# Patient Record
Sex: Female | Born: 1970
Health system: Southern US, Community
[De-identification: ages and names within clinical notes are randomized; demographics above are authoritative.]

## PROBLEM LIST (undated history)

## (undated) DIAGNOSIS — M1990 Unspecified osteoarthritis, unspecified site: Secondary | ICD-10-CM

## (undated) DIAGNOSIS — N6009 Solitary cyst of unspecified breast: Secondary | ICD-10-CM

## (undated) DIAGNOSIS — N390 Urinary tract infection, site not specified: Secondary | ICD-10-CM

## (undated) DIAGNOSIS — D219 Benign neoplasm of connective and other soft tissue, unspecified: Secondary | ICD-10-CM

## (undated) DIAGNOSIS — D649 Anemia, unspecified: Secondary | ICD-10-CM

## (undated) DIAGNOSIS — Z30430 Encounter for insertion of intrauterine contraceptive device: Secondary | ICD-10-CM

## (undated) DIAGNOSIS — T7840XA Allergy, unspecified, initial encounter: Secondary | ICD-10-CM

## (undated) DIAGNOSIS — R739 Hyperglycemia, unspecified: Secondary | ICD-10-CM

## (undated) DIAGNOSIS — R87619 Unspecified abnormal cytological findings in specimens from cervix uteri: Secondary | ICD-10-CM

## (undated) DIAGNOSIS — IMO0002 Reserved for concepts with insufficient information to code with codable children: Secondary | ICD-10-CM

## (undated) DIAGNOSIS — E119 Type 2 diabetes mellitus without complications: Secondary | ICD-10-CM

## (undated) DIAGNOSIS — Z8709 Personal history of other diseases of the respiratory system: Secondary | ICD-10-CM

## (undated) DIAGNOSIS — K219 Gastro-esophageal reflux disease without esophagitis: Secondary | ICD-10-CM

## (undated) DIAGNOSIS — R102 Pelvic and perineal pain: Secondary | ICD-10-CM

## (undated) DIAGNOSIS — Z8742 Personal history of other diseases of the female genital tract: Secondary | ICD-10-CM

## (undated) DIAGNOSIS — E162 Hypoglycemia, unspecified: Secondary | ICD-10-CM

## (undated) DIAGNOSIS — E785 Hyperlipidemia, unspecified: Secondary | ICD-10-CM

## (undated) DIAGNOSIS — K589 Irritable bowel syndrome without diarrhea: Secondary | ICD-10-CM

## (undated) DIAGNOSIS — Z8639 Personal history of other endocrine, nutritional and metabolic disease: Secondary | ICD-10-CM

## (undated) DIAGNOSIS — G709 Myoneural disorder, unspecified: Secondary | ICD-10-CM

## (undated) DIAGNOSIS — F419 Anxiety disorder, unspecified: Secondary | ICD-10-CM

## (undated) DIAGNOSIS — R569 Unspecified convulsions: Secondary | ICD-10-CM

## (undated) HISTORY — DX: Gastro-esophageal reflux disease without esophagitis: K21.9

## (undated) HISTORY — DX: Personal history of other diseases of the female genital tract: Z87.42

## (undated) HISTORY — DX: Hypoglycemia, unspecified: E16.2

## (undated) HISTORY — DX: Irritable bowel syndrome, unspecified: K58.9

## (undated) HISTORY — DX: Encounter for insertion of intrauterine contraceptive device: Z30.430

## (undated) HISTORY — DX: Anemia, unspecified: D64.9

## (undated) HISTORY — PX: MYOMECTOMY: SHX85

## (undated) HISTORY — PX: HERNIA REPAIR: SHX51

## (undated) HISTORY — DX: Type 2 diabetes mellitus without complications: E11.9

## (undated) HISTORY — DX: Personal history of other diseases of the respiratory system: Z87.09

## (undated) HISTORY — DX: Unspecified osteoarthritis, unspecified site: M19.90

## (undated) HISTORY — DX: Personal history of other endocrine, nutritional and metabolic disease: Z86.39

## (undated) HISTORY — DX: Allergy, unspecified, initial encounter: T78.40XA

## (undated) HISTORY — DX: Unspecified convulsions: R56.9

## (undated) HISTORY — DX: Anxiety disorder, unspecified: F41.9

## (undated) HISTORY — DX: Benign neoplasm of connective and other soft tissue, unspecified: D21.9

## (undated) HISTORY — DX: Hyperlipidemia, unspecified: E78.5

## (undated) HISTORY — DX: Urinary tract infection, site not specified: N39.0

## (undated) HISTORY — DX: Pelvic and perineal pain: R10.2

## (undated) HISTORY — DX: Myoneural disorder, unspecified: G70.9

## (undated) HISTORY — DX: Hyperglycemia, unspecified: R73.9

## (undated) HISTORY — DX: Solitary cyst of unspecified breast: N60.09

## (undated) HISTORY — DX: Reserved for concepts with insufficient information to code with codable children: IMO0002

## (undated) HISTORY — DX: Unspecified abnormal cytological findings in specimens from cervix uteri: R87.619

---

## 1999-04-05 ENCOUNTER — Ambulatory Visit (HOSPITAL_COMMUNITY): Admission: RE | Admit: 1999-04-05 | Discharge: 1999-04-05 | Payer: Self-pay | Admitting: Obstetrics and Gynecology

## 1999-04-05 ENCOUNTER — Encounter: Payer: Self-pay | Admitting: Obstetrics and Gynecology

## 2002-05-09 ENCOUNTER — Other Ambulatory Visit: Admission: RE | Admit: 2002-05-09 | Discharge: 2002-05-09 | Payer: Self-pay | Admitting: Obstetrics and Gynecology

## 2002-08-01 ENCOUNTER — Encounter: Payer: Self-pay | Admitting: Family Medicine

## 2002-08-01 ENCOUNTER — Ambulatory Visit (HOSPITAL_COMMUNITY): Admission: RE | Admit: 2002-08-01 | Discharge: 2002-08-01 | Payer: Self-pay | Admitting: Family Medicine

## 2003-05-13 ENCOUNTER — Other Ambulatory Visit: Admission: RE | Admit: 2003-05-13 | Discharge: 2003-05-13 | Payer: Self-pay | Admitting: Obstetrics and Gynecology

## 2004-06-22 ENCOUNTER — Other Ambulatory Visit: Admission: RE | Admit: 2004-06-22 | Discharge: 2004-06-22 | Payer: Self-pay | Admitting: Obstetrics and Gynecology

## 2005-06-23 ENCOUNTER — Other Ambulatory Visit: Admission: RE | Admit: 2005-06-23 | Discharge: 2005-06-23 | Payer: Self-pay | Admitting: Obstetrics and Gynecology

## 2005-09-29 ENCOUNTER — Encounter (INDEPENDENT_AMBULATORY_CARE_PROVIDER_SITE_OTHER): Payer: Self-pay | Admitting: Specialist

## 2005-09-29 ENCOUNTER — Ambulatory Visit (HOSPITAL_COMMUNITY): Admission: RE | Admit: 2005-09-29 | Discharge: 2005-09-29 | Payer: Self-pay | Admitting: Obstetrics and Gynecology

## 2005-09-29 HISTORY — PX: HYSTEROSCOPY: SHX211

## 2005-09-29 HISTORY — PX: DILATION AND CURETTAGE OF UTERUS: SHX78

## 2005-10-20 ENCOUNTER — Ambulatory Visit (HOSPITAL_COMMUNITY): Admission: RE | Admit: 2005-10-20 | Discharge: 2005-10-20 | Payer: Self-pay | Admitting: Obstetrics and Gynecology

## 2006-08-21 ENCOUNTER — Other Ambulatory Visit: Admission: RE | Admit: 2006-08-21 | Discharge: 2006-08-21 | Payer: Self-pay | Admitting: Obstetrics and Gynecology

## 2008-01-29 ENCOUNTER — Encounter: Admission: RE | Admit: 2008-01-29 | Discharge: 2008-01-29 | Payer: Self-pay | Admitting: Family Medicine

## 2008-02-29 ENCOUNTER — Encounter: Admission: RE | Admit: 2008-02-29 | Discharge: 2008-02-29 | Payer: Self-pay | Admitting: Family Medicine

## 2008-09-01 ENCOUNTER — Encounter: Admission: RE | Admit: 2008-09-01 | Discharge: 2008-09-01 | Payer: Self-pay | Admitting: Family Medicine

## 2008-09-11 ENCOUNTER — Encounter: Admission: RE | Admit: 2008-09-11 | Discharge: 2008-09-11 | Payer: Self-pay | Admitting: Obstetrics and Gynecology

## 2009-09-14 ENCOUNTER — Encounter: Admission: RE | Admit: 2009-09-14 | Discharge: 2009-09-14 | Payer: Self-pay | Admitting: Obstetrics and Gynecology

## 2009-10-01 ENCOUNTER — Encounter: Admission: RE | Admit: 2009-10-01 | Discharge: 2009-10-01 | Payer: Self-pay | Admitting: Family Medicine

## 2010-09-15 ENCOUNTER — Encounter: Admission: RE | Admit: 2010-09-15 | Discharge: 2010-09-15 | Payer: Self-pay | Admitting: Obstetrics and Gynecology

## 2010-09-27 DIAGNOSIS — Z30430 Encounter for insertion of intrauterine contraceptive device: Secondary | ICD-10-CM

## 2010-09-27 HISTORY — DX: Encounter for insertion of intrauterine contraceptive device: Z30.430

## 2011-04-08 NOTE — Op Note (Signed)
NAMEAMARE, KONTOS NO.:  [id number]   MEDICAL RECORD NO.:  [id number]          PATIENT TYPE:  AMB   LOCATION:  SDC                           FACILITY:  [facility]   PHYSICIAN:  Osborn Coho, M.D.   DATE OF BIRTH:  03-18-1971   DATE OF PROCEDURE:  09/29/2005  DATE OF DISCHARGE:                                 OPERATIVE REPORT   PREOPERATIVE DIAGNOSES:  1.  Menorrhagia.  2.  Fibroid.   POSTOPERATIVE DIAGNOSES:  1.  Menorrhagia.  2.  Fibroid.   PROCEDURES:  1.  Hysteroscopy.  2.  Dilation and curettage.  3.  Resection of fibroid.   ATTENDING:  Osborn Coho, M.D.   ANESTHESIA:  General via LMA.   SPECIMENS TO PATHOLOGY:  1.  Portions of fibroid.  2.  Endometrial curettings.   FLUIDS:  900 mL.   URINE OUTPUT:  Approximately 150 mL prior to procedure via straight  catheterization.   Hysteroscopic fluid deficit (sorbitol), approximately 100 mL estimated, with  approximately 700+ mL on the floor.   ESTIMATED BLOOD LOSS:  Minimal, less than 50 mL.   FINDINGS:  Approximately 3 cm posterior wall submucosal fibroid.   COMPLICATIONS:  None.   PROCEDURE:  The patient was taken to the operating room after the risks,  benefits and alternatives were reviewed with patient, the patient verbalized  understanding and consent signed and witnessed.  The patient was placed  under general anesthesia and prepped and draped in the normal sterile  fashion in the dorsal lithotomy position.  A bivalve speculum placed in the  patient's vagina and a paracervical block administered using a total of 10  mL of 1% lidocaine.  The cervix was dilated for passage of the resectoscope  after single-tooth tenaculum placed on the anterior lip of the cervix.  The  resectoscope introduced and findings as noted above.  Resection of fibroid  was performed and portions of fibroid sent to pathology.  Curettings was  performed was minimal tissue returning with curettage.  The uterus had  been  sounded at the beginning of the case to approximately 8 cm.  All instruments  were removed.  There was hemostasis at the left tenaculum site with a small  amount of bleeding from the right tenaculum site, which was made hemostatic  with a silver nitrate stick.  Count was correct.  The patient tolerated the  procedure well and is currently awaiting transfer to the recovery room in  good condition.      Osborn Coho, M.D.  Electronically Signed     AR/MEDQ  D:  09/29/2005  T:  09/29/2005  Job:  [id number]

## 2011-06-22 HISTORY — PX: ANKLE SURGERY: SHX546

## 2011-07-20 ENCOUNTER — Other Ambulatory Visit: Payer: Self-pay

## 2011-08-26 ENCOUNTER — Other Ambulatory Visit: Payer: Self-pay | Admitting: Obstetrics and Gynecology

## 2011-08-26 DIAGNOSIS — Z1231 Encounter for screening mammogram for malignant neoplasm of breast: Secondary | ICD-10-CM

## 2011-09-19 ENCOUNTER — Ambulatory Visit
Admission: RE | Admit: 2011-09-19 | Discharge: 2011-09-19 | Disposition: A | Discharge status: Home / Self Care | Payer: BC Managed Care – PPO | Source: Ambulatory Visit | Attending: Obstetrics and Gynecology | Admitting: Obstetrics and Gynecology

## 2011-09-19 DIAGNOSIS — Z1231 Encounter for screening mammogram for malignant neoplasm of breast: Secondary | ICD-10-CM

## 2011-11-29 ENCOUNTER — Ambulatory Visit (INDEPENDENT_AMBULATORY_CARE_PROVIDER_SITE_OTHER): Payer: BC Managed Care – PPO

## 2011-11-29 DIAGNOSIS — L600 Ingrowing nail: Secondary | ICD-10-CM

## 2011-11-29 DIAGNOSIS — M79609 Pain in unspecified limb: Secondary | ICD-10-CM

## 2011-11-30 ENCOUNTER — Ambulatory Visit (INDEPENDENT_AMBULATORY_CARE_PROVIDER_SITE_OTHER): Payer: BC Managed Care – PPO

## 2011-11-30 DIAGNOSIS — L600 Ingrowing nail: Secondary | ICD-10-CM

## 2012-02-15 ENCOUNTER — Ambulatory Visit (INDEPENDENT_AMBULATORY_CARE_PROVIDER_SITE_OTHER): Payer: BC Managed Care – PPO | Admitting: Family Medicine

## 2012-02-15 ENCOUNTER — Encounter: Payer: Self-pay | Admitting: Family Medicine

## 2012-02-15 VITALS — BP 104/72 | HR 77 | Temp 98.2°F | Resp 18 | Ht 64.0 in | Wt 140.8 lb

## 2012-02-15 DIAGNOSIS — K219 Gastro-esophageal reflux disease without esophagitis: Secondary | ICD-10-CM | POA: Insufficient documentation

## 2012-02-15 DIAGNOSIS — J309 Allergic rhinitis, unspecified: Secondary | ICD-10-CM

## 2012-02-15 DIAGNOSIS — Z0000 Encounter for general adult medical examination without abnormal findings: Secondary | ICD-10-CM

## 2012-02-15 DIAGNOSIS — Z8639 Personal history of other endocrine, nutritional and metabolic disease: Secondary | ICD-10-CM | POA: Insufficient documentation

## 2012-02-15 DIAGNOSIS — K589 Irritable bowel syndrome without diarrhea: Secondary | ICD-10-CM | POA: Insufficient documentation

## 2012-02-15 DIAGNOSIS — Z8709 Personal history of other diseases of the respiratory system: Secondary | ICD-10-CM

## 2012-02-15 LAB — LIPID PANEL
Cholesterol: 174 mg/dL (ref 0–200)
HDL: 51 mg/dL
LDL Cholesterol: 109 mg/dL — ABNORMAL HIGH (ref 0–99)
Total CHOL/HDL Ratio: 3.4 ratio
Triglycerides: 71 mg/dL
VLDL: 14 mg/dL (ref 0–40)

## 2012-02-15 LAB — CBC WITH DIFFERENTIAL/PLATELET
Basophils Absolute: 0 10*3/uL (ref 0.0–0.1)
Basophils Relative: 0 % (ref 0–1)
Hemoglobin: 12.4 g/dL (ref 12.0–15.0)
MCH: 29.5 pg (ref 26.0–34.0)
MCHC: 32.6 g/dL (ref 30.0–36.0)
MCV: 90.3 fL (ref 78.0–100.0)
Monocytes Relative: 9 % (ref 3–12)
Neutro Abs: 3.3 10*3/uL (ref 1.7–7.7)
Neutrophils Relative %: 53 % (ref 43–77)
Platelets: 227 10*3/uL (ref 150–400)
RBC: 4.21 MIL/uL (ref 3.87–5.11)
WBC: 6.4 10*3/uL (ref 4.0–10.5)

## 2012-02-15 LAB — COMPREHENSIVE METABOLIC PANEL WITH GFR
ALT: 8 U/L (ref 0–35)
AST: 15 U/L (ref 0–37)
Albumin: 4.5 g/dL (ref 3.5–5.2)
Alkaline Phosphatase: 68 U/L (ref 39–117)
BUN: 9 mg/dL (ref 6–23)
CO2: 24 meq/L (ref 19–32)
Calcium: 9.6 mg/dL (ref 8.4–10.5)
Chloride: 103 meq/L (ref 96–112)
Creat: 0.6 mg/dL (ref 0.50–1.10)
Glucose, Bld: 72 mg/dL (ref 70–99)
Potassium: 3.9 meq/L (ref 3.5–5.3)
Sodium: 136 meq/L (ref 135–145)
Total Bilirubin: 0.4 mg/dL (ref 0.3–1.2)
Total Protein: 7.1 g/dL (ref 6.0–8.3)

## 2012-02-15 LAB — POCT URINALYSIS DIPSTICK
Bilirubin, UA: NEGATIVE
Nitrite, UA: NEGATIVE
Protein, UA: NEGATIVE
Spec Grav, UA: 1.025

## 2012-02-15 MED ORDER — HYOSCYAMINE SULFATE 0.125 MG PO TABS: 0.1250 mg | ORAL_TABLET | Freq: Four times a day (QID) | ORAL | Status: DC | PRN

## 2012-02-15 NOTE — Progress Notes (Signed)
  Subjective:    Patient ID: Sandra Booker, female    DOB: 28-Aug-1971, 41 y.o.   MRN: [id number]  HPI  This 41 y.o. AA female is here for annual CPE and medications RFs. She has Hx of Vit D  deficiency and chronic problems with ankle/tendon injuries (S/P ankle surgeries x 2). She has GERD and IBS  for which she takes Levsin (needs a RF). She also has chronic Allergic rhinitis and has never tried a steroid nasal spray.  Weekly headaches are thought to be related to sinus pressure and allergies.   HCM: PAP- August 2012 with Dr. Dierdre Forth ( Normal)    Review of Systems  Constitutional: Negative.   HENT: Positive for congestion, rhinorrhea, sneezing and sinus pressure.   Eyes:       Wears corrective lenses  Respiratory: Negative.   Cardiovascular: Negative.   Gastrointestinal: Positive for abdominal pain and diarrhea.       Has Heartburn and acid reflux; also has IBS  Genitourinary: Negative.   Musculoskeletal: Negative.   Neurological: Positive for headaches.       Headaches occur on a weekly basis  Hematological:       Hx of anemia  Psychiatric/Behavioral: Negative.        Objective:   Physical Exam  Constitutional: She is oriented to person, place, and time. She appears well-developed and well-nourished. No distress.  HENT:  Head: Normocephalic and atraumatic.  Right Ear: External ear normal.  Left Ear: External ear normal.  Mouth/Throat: Oropharynx is clear and moist.       Nasal turbinates slightly enlarged and engorged with clear discharge. Sinuses tender with percussion.  Eyes: Conjunctivae and EOM are normal. Pupils are equal, round, and reactive to light. No scleral icterus.  Neck: Normal range of motion. Neck supple. No thyromegaly present.  Cardiovascular: Normal rate, regular rhythm, normal heart sounds and intact distal pulses.  Exam reveals no gallop and no friction rub.   No murmur heard. Pulmonary/Chest: No respiratory distress. She has no wheezes.    Abdominal: Soft. Bowel sounds are normal. She exhibits no distension and no mass. There is no tenderness. There is no guarding.  Musculoskeletal: Normal range of motion. She exhibits no edema and no tenderness.  Lymphadenopathy:    She has no cervical adenopathy.  Neurological: She is alert and oriented to person, place, and time. She has normal reflexes. No cranial nerve deficit. Coordination normal.  Skin: Skin is warm and dry. No rash noted. No erythema.  Psychiatric: She has a normal mood and affect. Her behavior is normal. Judgment and thought content normal.          Assessment & Plan:   1. Routine general medical examination at a health care facility   CBC with Differential, Comprehensive metabolic panel, Lipid panel, Vitamin D, 25-hydroxy  2. IBS (irritable bowel syndrome)  RF medication: Levsin  3. History of vitamin D deficiency  Continue supplement and healthy diet  4. History of allergic rhinitis  Sample: Qnasl NS given to pt to be used daily; she will contact office if she wants RX for spray

## 2012-02-16 ENCOUNTER — Encounter: Payer: Self-pay | Admitting: Family Medicine

## 2012-02-22 ENCOUNTER — Encounter: Payer: Self-pay | Admitting: Family Medicine

## 2012-02-22 MED ORDER — ERGOCALCIFEROL 1.25 MG (50000 UT) PO CAPS: 50000.0000 [IU] | ORAL_CAPSULE | ORAL | Status: AC

## 2012-02-22 NOTE — Progress Notes (Signed)
Addended by: Dow Adolph B on: 02/22/2012 06:52 PM   Modules accepted: Orders

## 2012-02-22 NOTE — Progress Notes (Signed)
Quick Note:  Please call pt and advise that the following labs are abnormal... Vit D deficiency persists. Drisdol supplement has been prescribed and is available for pick-up from your pharmacy ([facility]). It should be taken 1 capsule once a week. There are RFs.  All other labs are normal.  Provide copy of labs to pt. ______

## 2012-02-22 NOTE — Progress Notes (Signed)
Low Vitamin D level persists; Drisdol 50,000 IU has been prescribed   1 capsule once a week   (4 capsules/ month with 5 RFs).

## 2012-02-23 NOTE — Progress Notes (Signed)
LMOM for pt w/lab results and Rx info.

## 2012-04-02 ENCOUNTER — Ambulatory Visit (INDEPENDENT_AMBULATORY_CARE_PROVIDER_SITE_OTHER): Payer: BC Managed Care – PPO | Admitting: Physician Assistant

## 2012-04-02 VITALS — BP 119/75 | HR 73 | Temp 98.7°F | Resp 16 | Ht 64.0 in | Wt 140.0 lb

## 2012-04-02 DIAGNOSIS — J019 Acute sinusitis, unspecified: Secondary | ICD-10-CM

## 2012-04-02 DIAGNOSIS — J029 Acute pharyngitis, unspecified: Secondary | ICD-10-CM

## 2012-04-02 MED ORDER — IPRATROPIUM BROMIDE 0.06 % NA SOLN: 2.0000 | Freq: Three times a day (TID) | NASAL | Status: DC

## 2012-04-02 MED ORDER — CEFDINIR 300 MG PO CAPS: 300.0000 mg | ORAL_CAPSULE | Freq: Two times a day (BID) | ORAL | Status: AC

## 2012-04-02 NOTE — Progress Notes (Signed)
Patient ID: Sandra Booker MRN: [id number], DOB: 1971/03/06, 41 y.o. Date of Encounter: 04/02/2012, 12:07 PM  Primary Physician: Dow Adolph, MD, MD  Chief Complaint:  Chief Complaint  Patient presents with  . Sinusitis    sx's over 2 weeks (using Zyrtec and Benadryl)    HPI: 41 y.o. year old female presents with a 14 day history of nasal congestion, post nasal drip, sore throat, sinus pressure, and mild cough. Afebrile. No chills. Nasal congestion thick and green/yellow. Sinus pressure is the worst symptom along the frontal sinus. Cough is productive secondary to post nasal drip and not associated with time of day. Ears feel full, leading to sensation of muffled hearing. Has tried OTC cold preps without success. No GI complaints. Appetite normal.  No recent antibiotics, recent travels, or sick contacts   No leg trauma, sedentary periods, h/o cancer, or tobacco use.  Past Medical History  Diagnosis Date  . GERD (gastroesophageal reflux disease)   . IBS (irritable bowel syndrome)   . History of vitamin D deficiency   . History of allergic rhinitis   . History of iron deficiency      Home Meds: Prior to Admission medications   Medication Sig Start Date End Date Taking? Authorizing Provider  ergocalciferol (DRISDOL) 50000 UNITS capsule Take 1 capsule (50,000 Units total) by mouth once a week. 02/22/12 02/21/13 Yes Maurice March, MD  hyoscyamine (LEVSIN, ANASPAZ) 0.125 MG tablet Take 0.125 mg by mouth every 6 (six) hours as needed.   Yes Historical Provider, MD  levonorgestrel (MIRENA) 20 MCG/24HR IUD 1 each by Intrauterine route once.   Yes Historical Provider, MD  Multiple Vitamin (MULTIVITAMIN) tablet Take 1 tablet by mouth daily.   Yes Historical Provider, MD  pantoprazole (PROTONIX) 40 MG tablet Take 40 mg by mouth daily.   Yes Historical Provider, MD         hyoscyamine (LEVSIN, ANASPAZ) 0.125 MG tablet Take 1 tablet (0.125 mg total) by mouth every 6 (six) hours as  needed for cramping. 02/15/12 02/25/12  Maurice March, MD         methocarbamol (ROBAXIN) 500 MG tablet Take 500 mg by mouth 4 (four) times daily.    Historical Provider, MD    Allergies: No Known Allergies  History   Social History  . Marital Status: Married    Spouse Name: N/A    Number of Children: N/A  . Years of Education: N/A   Occupational History  . Not on file.   Social History Main Topics  . Smoking status: Never Smoker   . Smokeless tobacco: Not on file  . Alcohol Use: Not on file  . Drug Use: Not on file  . Sexually Active: Not on file   Other Topics Concern  . Not on file   Social History Narrative  . No narrative on file     Review of Systems: Constitutional: negative for chills, fever, night sweats or weight changes Cardiovascular: negative for chest pain or palpitations Respiratory: negative for hemoptysis, wheezing, or shortness of breath Abdominal: negative for abdominal pain, nausea, vomiting or diarrhea Dermatological: negative for rash Neurologic: negative for headache   Physical Exam: Blood pressure 119/75, pulse 73, temperature 98.7 F (37.1 C), temperature source Oral, resp. rate 16, height 5\' 4"  (1.626 m), weight 140 lb (63.504 kg), SpO2 98.00%., Body mass index is 24.03 kg/(m^2). General: Well developed, well nourished, in no acute distress. Head: Normocephalic, atraumatic, eyes without discharge, sclera non-icteric, nares are congested. Bilateral  auditory canals clear, TM's are without perforation, pearly grey with reflective cone of light bilaterally. Serous effusion bilaterally behind TM's. Maxillary sinus TTP. Oral cavity moist, dentition normal. Posterior pharynx with post nasal drip, mild erythema, and an isolated ulcer along right side without signs of secondary infection. No peritonsillar abscess or tonsillar exudate. Neck: Supple. No thyromegaly. Full ROM. No lymphadenopathy. Lungs: Clear bilaterally to auscultation without wheezes,  rales, or rhonchi. Breathing is unlabored.  Heart: RRR with S1 S2. No murmurs, rubs, or gallops appreciated. Msk:  Strength and tone normal for age. Extremities: No clubbing or cyanosis. No edema. Neuro: Alert and oriented X 3. Moves all extremities spontaneously. CNII-XII grossly in tact. Psych:  Responds to questions appropriately with a normal affect.     ASSESSMENT AND PLAN:  41 y.o. year old female with sinusitis -Omnicef 300 mg 1 po bid #20 no RF  -Atrovent NS 0.06% 2 sprays each nare bid prn #1 no RF -Mucinex -Tylenol/Motrin prn -Rest/fluids -RTC precautions -RTC 3-5 days if no improvement  Elinor Dodge, PA-C 04/02/2012 12:07 PM

## 2012-05-19 ENCOUNTER — Other Ambulatory Visit: Payer: Self-pay | Admitting: Family Medicine

## 2012-08-22 ENCOUNTER — Other Ambulatory Visit: Payer: Self-pay | Admitting: Obstetrics and Gynecology

## 2012-08-22 DIAGNOSIS — Z1231 Encounter for screening mammogram for malignant neoplasm of breast: Secondary | ICD-10-CM

## 2012-09-21 ENCOUNTER — Ambulatory Visit
Admission: RE | Admit: 2012-09-21 | Discharge: 2012-09-21 | Disposition: A | Discharge status: Home / Self Care | Payer: BC Managed Care – PPO | Source: Ambulatory Visit | Attending: Obstetrics and Gynecology | Admitting: Obstetrics and Gynecology

## 2012-09-21 DIAGNOSIS — Z1231 Encounter for screening mammogram for malignant neoplasm of breast: Secondary | ICD-10-CM

## 2012-10-03 DIAGNOSIS — E162 Hypoglycemia, unspecified: Secondary | ICD-10-CM | POA: Insufficient documentation

## 2012-10-03 DIAGNOSIS — Z8742 Personal history of other diseases of the female genital tract: Secondary | ICD-10-CM | POA: Insufficient documentation

## 2012-10-03 DIAGNOSIS — IMO0002 Reserved for concepts with insufficient information to code with codable children: Secondary | ICD-10-CM | POA: Insufficient documentation

## 2012-10-03 DIAGNOSIS — R102 Pelvic and perineal pain: Secondary | ICD-10-CM | POA: Insufficient documentation

## 2012-10-03 DIAGNOSIS — D219 Benign neoplasm of connective and other soft tissue, unspecified: Secondary | ICD-10-CM | POA: Insufficient documentation

## 2012-10-03 DIAGNOSIS — N390 Urinary tract infection, site not specified: Secondary | ICD-10-CM | POA: Insufficient documentation

## 2012-10-03 DIAGNOSIS — Z8639 Personal history of other endocrine, nutritional and metabolic disease: Secondary | ICD-10-CM | POA: Insufficient documentation

## 2012-10-08 ENCOUNTER — Encounter: Payer: Self-pay | Admitting: Obstetrics and Gynecology

## 2012-10-08 ENCOUNTER — Ambulatory Visit (INDEPENDENT_AMBULATORY_CARE_PROVIDER_SITE_OTHER): Payer: BC Managed Care – PPO | Admitting: Obstetrics and Gynecology

## 2012-10-08 VITALS — BP 100/60 | HR 68 | Resp 16 | Ht 64.0 in | Wt 146.0 lb

## 2012-10-08 DIAGNOSIS — D259 Leiomyoma of uterus, unspecified: Secondary | ICD-10-CM

## 2012-10-08 DIAGNOSIS — N949 Unspecified condition associated with female genital organs and menstrual cycle: Secondary | ICD-10-CM

## 2012-10-08 DIAGNOSIS — IMO0002 Reserved for concepts with insufficient information to code with codable children: Secondary | ICD-10-CM

## 2012-10-08 DIAGNOSIS — R87610 Atypical squamous cells of undetermined significance on cytologic smear of cervix (ASC-US): Secondary | ICD-10-CM

## 2012-10-08 DIAGNOSIS — D219 Benign neoplasm of connective and other soft tissue, unspecified: Secondary | ICD-10-CM

## 2012-10-08 DIAGNOSIS — Z124 Encounter for screening for malignant neoplasm of cervix: Secondary | ICD-10-CM

## 2012-10-08 DIAGNOSIS — Z8742 Personal history of other diseases of the female genital tract: Secondary | ICD-10-CM

## 2012-10-08 DIAGNOSIS — R102 Pelvic and perineal pain: Secondary | ICD-10-CM

## 2012-10-08 NOTE — Progress Notes (Signed)
AEX  Last Pap: 10/04/2011 ASC-US WNL: No Regular Periods:no cycles  Contraception: IUD Mirena( Placed 10/11) Monthly Breast exam:yes Tetanus<25yrs:no Nl.Bladder Function:yes Daily BMs:yes Healthy Diet:yes Calcium:yes Mammogram:yes Date of Mammogram: 10/01/2012 WNL Exercise:no Have often Exercise: None Seatbelt: yes Abuse at home: no Stressful work:yes Sigmoid-colonoscopy: None Bone Density: No PCP: Dow Adolph Change in PMH: None Change in RUE:AVWU  Subjective:    Sandra Booker is a 41 y.o. female, G2P2, who presents for an annual exam. No complaints.  Menorrhagia has resolved with Mirena in place.    History   Social History  . Marital Status: Married    Spouse Name: N/A    Number of Children: N/A  . Years of Education: N/A   Social History Main Topics  . Smoking status: Never Smoker   . Smokeless tobacco: None  . Alcohol Use: No  . Drug Use: No  . Sexually Active: Yes    Birth Control/ Protection: IUD   Other Topics Concern  . None   Social History Narrative  . None    Menstrual cycle:   LMP: No LMP recorded. Patient is not currently having periods (Reason: IUD).           Cycle: no bleeding or cramping  The following portions of the patient's history were reviewed and updated as appropriate: allergies, current medications, past family history, past medical history, past social history, past surgical history and problem list.  Review of Systems Pertinent items are noted in HPI. Breast:Negative for breast lump,nipple discharge or nipple retraction Gastrointestinal: Negative for abdominal pain, change in bowel habits or rectal bleeding Urinary:negative   Objective:    BP 100/60  Pulse 68  Resp 16  Ht 5\' 4"  (1.626 m)  Wt 146 lb (66.225 kg)  BMI 25.06 kg/m2    Weight:  Wt Readings from Last 1 Encounters:  10/08/12 146 lb (66.225 kg)          BMI: Body mass index is 25.06 kg/(m^2).  General Appearance: Alert, appropriate appearance  for age. No acute distress HEENT: Grossly normal Neck / Thyroid: Supple, no masses, nodes or enlargement Lungs: clear to auscultation bilaterally Back: No CVA tenderness Breast Exam: No masses or nodes.No dimpling, nipple retraction or discharge. Cardiovascular: Regular rate and rhythm. S1, S2, no murmur Gastrointestinal: Soft, non-tender, no masses or organomegaly Pelvic Exam: External genitalia: normal general appearance Vaginal: normal rugae Cervix: IUD string visualized and exocervical contact bleeding Adnexa: no masses Uterus: irregular enlargement 10-12 wks size Rectovaginal: normal rectal, no masses Lymphatic Exam: Non-palpable nodes in neck, clavicular, axillary, or inguinal regions  Skin: no rash or abnormalities Neurologic: Normal gait and speech, no tremor  Psychiatric: Alert and oriented, appropriate affect.   Wet Prep:not applicable Urinalysis:not applicable UPT: Not done   Assessment:    fibroids now asymptomatic  Menorrhagia, resolved with Mirena  ASCUS neg HR HPV 1 yr ago  Plan:    mammogram pap smear return annually or prn STD screening: declined Contraception:IUD   Dierdre Forth MD

## 2012-10-10 LAB — PAP IG W/ RFLX HPV ASCU

## 2012-10-17 ENCOUNTER — Other Ambulatory Visit: Payer: Self-pay | Admitting: Family Medicine

## 2012-10-19 ENCOUNTER — Ambulatory Visit (INDEPENDENT_AMBULATORY_CARE_PROVIDER_SITE_OTHER): Payer: BC Managed Care – PPO | Admitting: Physician Assistant

## 2012-10-19 VITALS — BP 117/70 | HR 83 | Temp 98.6°F | Resp 16 | Ht 64.0 in | Wt 143.6 lb

## 2012-10-19 DIAGNOSIS — R05 Cough: Secondary | ICD-10-CM

## 2012-10-19 DIAGNOSIS — J329 Chronic sinusitis, unspecified: Secondary | ICD-10-CM

## 2012-10-19 MED ORDER — CEFDINIR 300 MG PO CAPS: 300.0000 mg | ORAL_CAPSULE | Freq: Two times a day (BID) | ORAL | Status: DC

## 2012-10-19 MED ORDER — HYDROCODONE-HOMATROPINE 5-1.5 MG/5ML PO SYRP: ORAL_SOLUTION | ORAL | Status: DC

## 2012-10-19 NOTE — Progress Notes (Signed)
Patient ID: Sandra Booker MRN: [id number], DOB: Feb 11, 1971, 41 y.o. Date of Encounter: 10/19/2012, 4:10 PM  Primary Physician: Dow Adolph, MD  Chief Complaint:  Chief Complaint  Patient presents with  . Cough    some time has chest pain from coughing so much x 5 days  . Nasal Congestion    HPI: 41 y.o. year old female presents with a five day history of nasal congestion, post nasal drip, sore throat, sinus pressure, and cough. Afebrile. No chills. Nasal congestion thick and green/yellow. Cough is sometimes productive of green/yellow sputum and seems to worsen as the day progresses. No wheezing, shortness of breath, dyspnea, tachypnea, or edema. She does complain of being sore from coughing, but no frank chest pain. Ears feel full, leading to sensation of muffled hearing. Has tried OTC cold preps without success. She did restart her left over Atrovent nasal spray and notes that this did help her some with ehr nasal congestion and sinus pressure. No GI complaints. Appetite normal.   No sick contacts, recent antibiotics, or recent travels.   No leg trauma, sedentary periods, h/o cancer, or tobacco use.  Past Medical History  Diagnosis Date  . GERD (gastroesophageal reflux disease)   . IBS (irritable bowel syndrome)   . History of vitamin D deficiency   . History of allergic rhinitis   . History of iron deficiency   . Encounter for insertion of mirena IUD 09/27/2010  . H/O cervicitis   . H/O menorrhagia   . Fibroid   . Hypoglycemia   . UTI (urinary tract infection)     h/o  . Pelvic pain in female     h/o  . Cyst of breast     right breast  . Abnormal Pap smear      Home Meds: Prior to Admission medications   Medication Sig Start Date End Date Taking? Authorizing Provider  hyoscyamine (LEVSIN, ANASPAZ) 0.125 MG tablet Take 0.125 mg by mouth every 6 (six) hours as needed.   Yes Historical Provider, MD  levonorgestrel (MIRENA) 20 MCG/24HR IUD 1 each by Intrauterine  route once.   Yes Historical Provider, MD  Multiple Vitamin (MULTIVITAMIN) tablet Take 1 tablet by mouth daily.   Yes Historical Provider, MD  pantoprazole (PROTONIX) 40 MG tablet TAKE 1 TABLET BY MOUTH EVERY MORNING 30 MINUTES BEFORE MEAL FOR REFLUX 05/19/12  Yes Chelle S Jeffery, PA-C         ergocalciferol (DRISDOL) 50000 UNITS capsule Take 1 capsule (50,000 Units total) by mouth once a week. 02/22/12 02/21/13  Maurice March, MD         hyoscyamine (LEVSIN, ANASPAZ) 0.125 MG tablet TAKE 1 TABLET BY MOUTH EVERY 6 HOURS AS NEEDED FOR CRAMPING 10/17/12   Sondra Barges, PA-C    Allergies: No Known Allergies  History   Social History  . Marital Status: Married    Spouse Name: N/A    Number of Children: N/A  . Years of Education: N/A   Occupational History  . Not on file.   Social History Main Topics  . Smoking status: Never Smoker   . Smokeless tobacco: Not on file  . Alcohol Use: No  . Drug Use: No  . Sexually Active: Yes    Birth Control/ Protection: IUD   Other Topics Concern  . Not on file   Social History Narrative  . No narrative on file     Review of Systems: Constitutional: negative for chills, fever, night sweats or weight  changes Cardiovascular: negative for chest pain or palpitations Respiratory: negative for hemoptysis, wheezing, dyspnea, or shortness of breath Abdominal: negative for abdominal pain, nausea, vomiting or diarrhea Dermatological: negative for rash   Physical Exam: Blood pressure 117/70, pulse 83, temperature 98.6 F (37 C), temperature source Oral, resp. rate 16, height 5\' 4"  (1.626 m), weight 143 lb 9.6 oz (65.137 kg), SpO2 99.00%., Body mass index is 24.65 kg/(m^2). General: Well developed, well nourished, in no acute distress. Head: Normocephalic, atraumatic, eyes without discharge, sclera non-icteric, nares are congested. Bilateral auditory canals clear, TM's are without perforation, pearly grey with reflective cone of light bilaterally.  Serous effusion bilaterally behind TM's. Frontal sinus TTP. Oral cavity moist, dentition normal. Posterior pharynx with post nasal drip and mild erythema. No peritonsillar abscess or tonsillar exudate. Uvula midline. Neck: Supple. No thyromegaly. Full ROM. No lymphadenopathy. Lungs: Coarse breath sounds bilaterally without wheezes, rales, or rhonchi. Breathing is unlabored.  Heart: RRR with S1 S2. No murmurs, rubs, or gallops appreciated. Msk:  Strength and tone normal for age. Extremities: No clubbing or cyanosis. No edema. Neuro: Alert and oriented X 3. Moves all extremities spontaneously. CNII-XII grossly in tact. Psych:  Responds to questions appropriately with a normal affect.     ASSESSMENT AND PLAN:  41 y.o. year old female with sinobronchitis -Omnicef 300 mg 1 po bid #20 no RF  -Hycodan #4oz 1 tsp po q 4-6 hours prn cough no RF SED -Continue at home Atrovent nasal spray -Mucinex -Tylenol/Motrin prn -Rest/fluids -RTC precautions -RTC 3-5 days if no improvement  Signed, Eula Listen, PA-C 10/19/2012 4:10 PM

## 2012-11-22 ENCOUNTER — Ambulatory Visit (INDEPENDENT_AMBULATORY_CARE_PROVIDER_SITE_OTHER): Payer: BC Managed Care – PPO | Admitting: Emergency Medicine

## 2012-11-22 ENCOUNTER — Ambulatory Visit: Payer: BC Managed Care – PPO

## 2012-11-22 VITALS — BP 99/66 | HR 71 | Temp 98.7°F | Resp 16 | Ht 64.5 in | Wt 145.0 lb

## 2012-11-22 DIAGNOSIS — R071 Chest pain on breathing: Secondary | ICD-10-CM

## 2012-11-22 DIAGNOSIS — R0781 Pleurodynia: Secondary | ICD-10-CM

## 2012-11-22 MED ORDER — HYDROCODONE-ACETAMINOPHEN 5-325 MG PO TABS: 1.0000 | ORAL_TABLET | Freq: Four times a day (QID) | ORAL | Status: DC | PRN

## 2012-11-22 NOTE — Progress Notes (Signed)
[facility] [address] [place] [address] [phone]  Date:  11/22/2012   Name:  Sandra Booker   DOB:  November 30, 1970   MRN:  [id number]  PCP:  Dow Adolph, MD    Chief Complaint: Chest Pain   History of Present Illness:  Sandra Booker is a 42 y.o. very pleasant female patient who presents with the following:  Patient was seen at Thanksgiving for bronchitis and sinus infection.  She improved with treatment.  Still having sharp pleuritic right chest pain.  Worse with moving and lifting.  No cough or fever or chills.  No nausea or vomiting.  No wheezing or shortness of breath.  Seems to be worsening with time.  Patient Active Problem List  Diagnosis  . GERD (gastroesophageal reflux disease)  . IBS (irritable bowel syndrome)  . History of vitamin D deficiency  . History of allergic rhinitis  . History of iron deficiency  . H/O cervicitis  . H/O menorrhagia  . Fibroid  . Hypoglycemia  . UTI (urinary tract infection)  . Pelvic pain in female  . ASCUS favor benign    Past Medical History  Diagnosis Date  . GERD (gastroesophageal reflux disease)   . IBS (irritable bowel syndrome)   . History of vitamin D deficiency   . History of allergic rhinitis   . History of iron deficiency   . Encounter for insertion of mirena IUD 09/27/2010  . H/O cervicitis   . H/O menorrhagia   . Fibroid   . Hypoglycemia   . UTI (urinary tract infection)     h/o  . Pelvic pain in female     h/o  . Cyst of breast     right breast  . Abnormal Pap smear     Past Surgical History  Procedure Date  . Myomectomy   . Ankle surgery 06/2011  . Dilation and curettage of uterus 09/29/2005  . Hysteroscopy 09/29/2005    History  Substance Use Topics  . Smoking status: Never Smoker   . Smokeless tobacco: Not on file  . Alcohol Use: No    Family History  Problem Relation Age of Onset  . Breast cancer Mother   . Hypertension Mother   . Hypertension Father   .  Cancer Maternal Grandfather 52    Prostate    No Known Allergies  Medication list has been reviewed and updated.  Current Outpatient Prescriptions on File Prior to Visit  Medication Sig Dispense Refill  . ergocalciferol (DRISDOL) 50000 UNITS capsule Take 1 capsule (50,000 Units total) by mouth once a week.  4 capsule  5  . hyoscyamine (LEVSIN, ANASPAZ) 0.125 MG tablet Take 0.125 mg by mouth every 6 (six) hours as needed.      Marland Kitchen levonorgestrel (MIRENA) 20 MCG/24HR IUD 1 each by Intrauterine route once.      . Multiple Vitamin (MULTIVITAMIN) tablet Take 1 tablet by mouth daily.      . pantoprazole (PROTONIX) 40 MG tablet TAKE 1 TABLET BY MOUTH EVERY MORNING 30 MINUTES BEFORE MEAL FOR REFLUX  30 tablet  5    Review of Systems:  As per HPI, otherwise negative.    Physical Examination: Filed Vitals:   11/22/12 1843  BP: 99/66  Pulse: 71  Temp: 98.7 F (37.1 C)  Resp: 16   Filed Vitals:   11/22/12 1843  Height: 5' 4.5" (1.638 m)  Weight: 145 lb (65.772 kg)   Body mass index is 24.50 kg/(m^2). Ideal  Body Weight: Weight in (lb) to have BMI = 25: 147.6   GEN: WDWN, NAD, Non-toxic, A & O x 3 HEENT: Atraumatic, Normocephalic. Neck supple. No masses, No LAD. Ears and Nose: No external deformity. CV: RRR, No M/G/R. No JVD. No thrill. No extra heart sounds. PULM: CTA B, no wheezes, crackles, rhonchi. No retractions. No resp. distress. No accessory muscle use.  No rub or tenderness ABD: S, NT, ND, +BS. No rebound. No HSM. EXTR: No c/c/e NEURO Normal gait.  PSYCH: Normally interactive. Conversant. Not depressed or anxious appearing.  Calm demeanor.    Assessment and Plan:  Pleuritic chest pain No risks for PE Hydrocodone Follow up for increased or new symptoms  Carmelina Dane, MD  [facility] reading (PRIMARY) by  Dr. Dareen Piano.  No active disease.

## 2012-11-22 NOTE — Patient Instructions (Signed)
Pleurisy Pleurisy is an inflammation and swelling of the lining of the lungs. It usually is the result of an underlying infection or other disease. Because of this inflammation, it hurts to breathe. It is aggravated by coughing or deep breathing. The primary goal in treating pleurisy is to diagnose and treat the condition that caused it.  HOME CARE INSTRUCTIONS   Only take over-the-counter or prescription medicines for pain, discomfort, or fever as directed by your caregiver.  If medications which kill germs (antibiotics) were prescribed, take the entire course. Even if you are feeling better, you need to take them.  Use a cool mist vaporizer to help loosen secretions. This is so the secretions can be coughed up more easily. SEEK MEDICAL CARE IF:   Your pain is not controlled with medication or is increasing.  You have an increase inpus like (purulent) secretions brought up with coughing. SEEK IMMEDIATE MEDICAL CARE IF:   You have blue or dark lips, fingernails, or toenails.  You begin coughing up blood.  You have increased difficulty breathing.  You have continuing pain unrelieved by medicine or lasting more than 1 week.  You have pain that radiates into your neck, arms, or jaw.  You develop increased shortness of breath or wheezing.  You develop a fever, rash, vomiting, fainting, or other serious complaints. Document Released: 11/07/2005 Document Revised: 01/30/2012 Document Reviewed: 06/08/2007 [employer] Patient Information 2013 [employer].  

## 2013-01-04 ENCOUNTER — Other Ambulatory Visit: Payer: Self-pay | Admitting: Family Medicine

## 2013-01-06 ENCOUNTER — Other Ambulatory Visit: Payer: Self-pay | Admitting: Physician Assistant

## 2013-05-21 ENCOUNTER — Other Ambulatory Visit: Payer: Self-pay | Admitting: Physician Assistant

## 2013-06-21 ENCOUNTER — Other Ambulatory Visit: Payer: Self-pay | Admitting: Physician Assistant

## 2013-07-24 ENCOUNTER — Ambulatory Visit (INDEPENDENT_AMBULATORY_CARE_PROVIDER_SITE_OTHER): Payer: BC Managed Care – PPO | Admitting: Family Medicine

## 2013-07-24 ENCOUNTER — Encounter: Payer: Self-pay | Admitting: Family Medicine

## 2013-07-24 VITALS — BP 101/72 | HR 85 | Temp 98.7°F | Resp 16 | Ht 65.0 in | Wt 147.0 lb

## 2013-07-24 DIAGNOSIS — F411 Generalized anxiety disorder: Secondary | ICD-10-CM

## 2013-07-24 DIAGNOSIS — M791 Myalgia, unspecified site: Secondary | ICD-10-CM

## 2013-07-24 DIAGNOSIS — K589 Irritable bowel syndrome without diarrhea: Secondary | ICD-10-CM

## 2013-07-24 DIAGNOSIS — IMO0001 Reserved for inherently not codable concepts without codable children: Secondary | ICD-10-CM

## 2013-07-24 DIAGNOSIS — K219 Gastro-esophageal reflux disease without esophagitis: Secondary | ICD-10-CM

## 2013-07-24 DIAGNOSIS — E559 Vitamin D deficiency, unspecified: Secondary | ICD-10-CM

## 2013-07-24 MED ORDER — PANTOPRAZOLE SODIUM 40 MG PO TBEC: 40.0000 mg | DELAYED_RELEASE_TABLET | Freq: Every day | ORAL | Status: DC

## 2013-07-24 MED ORDER — ALPRAZOLAM 0.25 MG PO TABS: ORAL_TABLET | ORAL | Status: DC

## 2013-07-24 MED ORDER — HYOSCYAMINE SULFATE 0.125 MG PO TABS: ORAL_TABLET | ORAL | Status: DC

## 2013-07-24 NOTE — Progress Notes (Signed)
S:  This 42 y.o. AA female is here for medication refills. She has IBS and GERD which are exacerbated by stress. Diet modification helps reduce symptoms. She has been stressed lately w/ husband's health issues and is sleeping poorly; she falls asleep w/o difficulty but awakens throughout the night. Pt also has a lot of muscle pain in her shoulders and upper back. Chirporactic treatment was helpful but pt cannot afford newer treatment options offered.   PMHx, Soc Hx and Fam Hx reviewed. Medications reconciled. Pt has GYN exam scheduled w/ Dr. Pennie Rushing.  ROS: As per HPI; negative for abnormal weight change, diaphoresis, fever/ chills, anorexia, excessive fatigue, CP or palpitations, SOB or DOE, abd pain,n/v, hematochezia or melena, shin changes or rashes, temp intolerances. Pt has no agitation, hallucinations, behavior changes or dysphoric mood.  O: Filed Vitals:   07/24/13 0949  BP: 101/72  Pulse: 85  Temp: 98.7 F (37.1 C)  Resp: 16   GEN: In NAD; WN,WD. HENT: [place]/AT; EOMI w/ clear conj/sclerae. Otherwise unremarkable.  NECK: Supple w/o LAN or TMG. COR: RRR. LUNGS: Normal resp rate and effort. MS: MAEs; muscle spasms and tenderness in posterior cervical muscles, rhomboids and paravertebral muscle groups. SKIN: W&D; no erythema or lesions. NEURO: A&O x 3; CNs intact. Sensory/motor function intact. Gait normal. Nonfocal.  A/P: IBS (irritable bowel syndrome)- Continue current medication and nutrition modifications. Minimize stress.  GERD (gastroesophageal reflux disease)- Continue PPI.  Anxiety state, unspecified- RX: Alprazolam 0.25 mg 1 tab hs prn sleep.  Muscle pain- Pt to consider massage therapy.  Unspecified vitamin D deficiency- Resume OTC Vitamin D3 1000 IU daily.   Meds ordered this encounter  Medications  . hyoscyamine (LEVSIN, ANASPAZ) 0.125 MG tablet    Sig: TAKE 1 TABLET BY MOUTH EVERY 6 HOURS AS NEEDED FOR CRAMPING    Dispense:  30 tablet    Refill:  5  .  pantoprazole (PROTONIX) 40 MG tablet    Sig: Take 1 tablet (40 mg total) by mouth daily.    Dispense:  30 tablet    Refill:  5  . ALPRAZolam (XANAX) 0.25 MG tablet    Sig: Take 1 tablet at bedtime as needed for sleep.    Dispense:  30 tablet    Refill:  0

## 2013-07-24 NOTE — Patient Instructions (Addendum)
Vitamin D Deficiency Vitamin D is an important vitamin that your body needs. Having too little of it in your body is called a deficiency. A very bad deficiency can make your bones soft and can cause a condition called rickets.  Vitamin D is important to your body for different reasons, such as:   It helps your body absorb 2 minerals called calcium and phosphorus.  It helps make your bones healthy.  It may prevent some diseases, such as diabetes and multiple sclerosis.  It helps your muscles and heart. You can get vitamin D in several ways. It is a natural part of some foods. The vitamin is also added to some dairy products and cereals. Some people take vitamin D supplements. Also, your body makes vitamin D when you are in the sun. It changes the sun's rays into a form of the vitamin that your body can use. CAUSES   Not eating enough foods that contain vitamin D.  Not getting enough sunlight.  Having certain digestive system diseases that make it hard to absorb vitamin D. These diseases include Crohn's disease, chronic pancreatitis, and cystic fibrosis.  Having a surgery in which part of the stomach or small intestine is removed.  Being obese. Fat cells pull vitamin D out of your blood. That means that obese people may not have enough vitamin D left in their blood and in other body tissues.  Having chronic kidney or liver disease. RISK FACTORS Risk factors are things that make you more likely to develop a vitamin D deficiency. They include:  Being older.  Not being able to get outside very much.  Living in a nursing home.  Having had broken bones.  Having weak or thin bones (osteoporosis).  Having a disease or condition that changes how your body absorbs vitamin D.  Having dark skin.  Some medicines such as seizure medicines or steroids.  Being overweight or obese. SYMPTOMS Mild cases of vitamin D deficiency may not have any symptoms. If you have a very bad case, symptoms  may include:  Bone pain.  Muscle pain.  Falling often.  Broken bones caused by a minor injury, due to osteoporosis. DIAGNOSIS A blood test is the best way to tell if you have a vitamin D deficiency. TREATMENT Vitamin D deficiency can be treated in different ways. Treatment for vitamin D deficiency depends on what is causing it. Options include:  Taking vitamin D supplements.  Taking a calcium supplement. Your caregiver will suggest what dose is best for you. HOME CARE INSTRUCTIONS  Take any supplements that your caregiver prescribes. Follow the directions carefully. Take only the suggested amount.  Have your blood tested 2 months after you start taking supplements.  Eat foods that contain vitamin D. Healthy choices include:  Fortified dairy products, cereals, or juices. Fortified means vitamin D has been added to the food. Check the label on the package to be sure.  Fatty fish like salmon or trout.  Eggs.  Oysters.  Spend some time in the sun. Most people should get out in the sun without sunblock for 10 to 15 minutes at a time. Do this 3 times a week. People who have had skin cancer should not do this. Ask your caregiver how long you should be in the sun. Do not use a tanning bed.  Keep your weight at a healthy level. Lose weight if you need to.  Keep all follow-up appointments. Your caregiver will need to perform blood tests to make sure your  vitamin D deficiency is going away. SEEK MEDICAL CARE IF:  You have any questions about your treatment.  You continue to have symptoms of vitamin D deficiency.  You have nausea or vomiting.  You are constipated.  You feel confused.  You have severe abdominal or back pain. MAKE SURE YOU:  Understand these instructions.  Will watch your condition.  Will get help right away if you are not doing well or get worse. Document Released: 01/30/2012 Document Reviewed: 01/30/2012 [facility] Patient Information 2014 [place],  [place].   Get a Vitamin D3 supplement [employer]) 1000 IU and take it daily as well as getting some sun exposure most days of the week.

## 2013-08-30 ENCOUNTER — Encounter: Payer: Self-pay | Admitting: Family Medicine

## 2013-08-30 ENCOUNTER — Ambulatory Visit: Payer: BC Managed Care – PPO

## 2013-08-30 ENCOUNTER — Ambulatory Visit (INDEPENDENT_AMBULATORY_CARE_PROVIDER_SITE_OTHER): Payer: BC Managed Care – PPO | Admitting: Family Medicine

## 2013-08-30 VITALS — BP 105/70 | HR 91 | Temp 99.0°F | Resp 18 | Ht 65.0 in | Wt 148.0 lb

## 2013-08-30 DIAGNOSIS — R0602 Shortness of breath: Secondary | ICD-10-CM

## 2013-08-30 MED ORDER — ALBUTEROL SULFATE HFA 108 (90 BASE) MCG/ACT IN AERS: 2.0000 | INHALATION_SPRAY | Freq: Four times a day (QID) | RESPIRATORY_TRACT | Status: DC | PRN

## 2013-08-30 MED ORDER — ALBUTEROL SULFATE (2.5 MG/3ML) 0.083% IN NEBU
2.5000 mg | INHALATION_SOLUTION | Freq: Once | RESPIRATORY_TRACT | Status: AC
  Administered 2013-08-30: 2.5 mg via RESPIRATORY_TRACT

## 2013-08-30 NOTE — Patient Instructions (Signed)
Use the albuterol inhaler as needed for shortness of breath or wheezing.  If you are not feeling better in the next day or so please let me know.  If you have any severe shortness of breath or other concerns please get help at the ER right away.

## 2013-08-30 NOTE — Progress Notes (Signed)
[facility] [address], [place] [address] [phone]  Date:  08/30/2013   Name:  Sandra Booker   DOB:  [birth year]   MRN:  [id number]  PCP:  Dow Adolph, MD    Chief Complaint: No chief complaint on file.   History of Present Illness:  Sandra Booker is a 42 y.o. 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  She noted SOB and that her chest felt tight after breathing in the fumes.  This feeling has continued since yesterday, and seems to be getting worse.  She does not have a history of asthma and has not noted wheezing.  She has not noted any rash or hives, no lip or tongue swelling.  Her chest is not painful but feels "tight," and taking a deep breath can be difficult  No recent travel, no history of DVT or PE in the past.    She has a mirena IUD so no risk of pregnancy at this time  She has not tried any medication for this condition so far  Patient Active Problem List   Diagnosis Date Noted  . History of iron deficiency   . H/O cervicitis   . H/O menorrhagia   . Fibroid   . Hypoglycemia   . UTI (urinary tract infection)   . Pelvic pain in female   . ASCUS favor benign   . GERD (gastroesophageal reflux disease)   . IBS (irritable bowel syndrome)   . History of vitamin D deficiency   . History of allergic rhinitis     Past Medical History  Diagnosis Date  . GERD (gastroesophageal reflux disease)   . IBS (irritable bowel syndrome)   . History of vitamin D deficiency   . History of allergic rhinitis   . History of iron deficiency   . Encounter for insertion of mirena IUD 09/27/2010  . H/O cervicitis   . H/O menorrhagia   . Fibroid   . Hypoglycemia   . UTI (urinary tract infection)     h/o  . Pelvic pain in female     h/o  . Cyst of breast     right breast  . Abnormal Pap smear     Past Surgical History   Procedure Laterality Date  . Myomectomy    . Ankle surgery  06/2011  . Dilation and curettage of uterus  09/29/2005  . Hysteroscopy  09/29/2005    History  Substance Use Topics  . Smoking status: Never Smoker   . Smokeless tobacco: Not on file  . Alcohol Use: No    Family History  Problem Relation Age of Onset  . Breast cancer Mother   . Hypertension Mother   . Hypertension Father   . Cancer Maternal Grandfather 83    Prostate    No Known Allergies  Medication list has been reviewed and updated.  Current Outpatient Prescriptions on File Prior to Visit  Medication Sig Dispense Refill  . ALPRAZolam (XANAX) 0.25 MG tablet Take 1 tablet at bedtime as needed for sleep.  30 tablet  0  . hyoscyamine (LEVSIN, ANASPAZ) 0.125 MG tablet TAKE 1 TABLET BY MOUTH EVERY 6 HOURS AS NEEDED FOR CRAMPING  30 tablet  5  . levonorgestrel (MIRENA) 20 MCG/24HR IUD 1 each by Intrauterine route once.      . Multiple Vitamin (MULTIVITAMIN) tablet Take 1 tablet by  mouth daily.      . pantoprazole (PROTONIX) 40 MG tablet Take 1 tablet (40 mg total) by mouth daily.  30 tablet  5   No current facility-administered medications on file prior to visit.    Review of Systems:  As per HPI- otherwise negative.  Physical Examination: Filed Vitals:   08/30/13 1703  BP: 122/80  Pulse: 86  Temp: 99 F (37.2 C)  Resp: 18   Filed Vitals:   08/30/13 1703  Height: 5\' 5"  (1.651 m)  Weight: 148 lb (67.132 kg)   Body mass index is 24.63 kg/(m^2). Ideal Body Weight: Weight in (lb) to have BMI = 25: 149.9  GEN: WDWN, NAD, Non-toxic, A & O x 3, looks well, VS reassuring HEENT: Atraumatic, Normocephalic. Neck supple. No masses, No LAD.  Bilateral TM wnl, oropharynx normal.  PEERL,EOMI.   No angioedema Ears and Nose: No external deformity. CV: RRR, No M/G/R. No JVD. No thrill. No extra heart sounds. PULM: CTA B, no wheezes, crackles, rhonchi. No retractions. No resp. distress. No accessory muscle use. ABD:  S, NT, ND EXTR: No c/c/e.  No calf swelling or edema NEURO Normal gait.  PSYCH: Normally interactive. Conversant. Not depressed or anxious appearing.  Calm demeanor.   Treated with albuterol neb which gave her significant relief of her sx.  [facility] reading (PRIMARY) by  Dr. Patsy Lager. CXR:  Negative  CHEST 2 VIEW  COMPARISON: November 22, 2012.  FINDINGS: The heart size and mediastinal contours are within normal limits. Both lungs are clear. The visualized skeletal structures are unremarkable.  IMPRESSION: No active cardiopulmonary disease.  EKG: NSR, no ST elelvation or depression  Assessment and Plan: Shortness of breath - Plan: albuterol (PROVENTIL) (2.5 MG/3ML) 0.083% nebulizer solution 2.5 mg, DG Chest 2 View, EKG 12-Lead, albuterol (PROVENTIL HFA;VENTOLIN HFA) 108 (90 BASE) MCG/ACT inhaler  She felt much better after albuterol neb.  Checked her VS again- stable and normal.  Exam remained normal.  She felt reassured and much better, and wished to go home.  rx for albuterol inhaler to have on hand.  Counseled to seek emergency care right away if her sx get worse.    Signed Abbe Amsterdam, MD

## 2013-09-02 ENCOUNTER — Other Ambulatory Visit: Payer: Self-pay

## 2013-09-02 DIAGNOSIS — Z1231 Encounter for screening mammogram for malignant neoplasm of breast: Secondary | ICD-10-CM

## 2013-09-23 ENCOUNTER — Ambulatory Visit
Admission: RE | Admit: 2013-09-23 | Discharge: 2013-09-23 | Disposition: A | Discharge status: Home / Self Care | Payer: BC Managed Care – PPO | Source: Ambulatory Visit

## 2013-09-23 DIAGNOSIS — Z1231 Encounter for screening mammogram for malignant neoplasm of breast: Secondary | ICD-10-CM

## 2013-09-26 ENCOUNTER — Other Ambulatory Visit: Payer: Self-pay

## 2013-10-30 ENCOUNTER — Ambulatory Visit (INDEPENDENT_AMBULATORY_CARE_PROVIDER_SITE_OTHER): Payer: BC Managed Care – PPO | Admitting: Physician Assistant

## 2013-10-30 VITALS — BP 100/72 | HR 77 | Temp 98.4°F | Resp 18 | Ht 64.0 in | Wt 143.0 lb

## 2013-10-30 DIAGNOSIS — R05 Cough: Secondary | ICD-10-CM

## 2013-10-30 DIAGNOSIS — J329 Chronic sinusitis, unspecified: Secondary | ICD-10-CM

## 2013-10-30 DIAGNOSIS — R059 Cough, unspecified: Secondary | ICD-10-CM

## 2013-10-30 DIAGNOSIS — H6120 Impacted cerumen, unspecified ear: Secondary | ICD-10-CM

## 2013-10-30 DIAGNOSIS — H6121 Impacted cerumen, right ear: Secondary | ICD-10-CM

## 2013-10-30 MED ORDER — AMOXICILLIN-POT CLAVULANATE 875-125 MG PO TABS: 1.0000 | ORAL_TABLET | Freq: Two times a day (BID) | ORAL | Status: DC

## 2013-10-30 MED ORDER — IPRATROPIUM BROMIDE 0.03 % NA SOLN: 2.0000 | Freq: Two times a day (BID) | NASAL | Status: DC

## 2013-10-30 MED ORDER — HYDROCOD POLST-CHLORPHEN POLST 10-8 MG/5ML PO LQCR: 5.0000 mL | Freq: Two times a day (BID) | ORAL | Status: DC | PRN

## 2013-10-30 NOTE — Patient Instructions (Signed)
Get plenty of rest and drink at least 64 ounces of water daily. You may use the Tussionex for cough every 12 hours, however, it causes sleepiness, so reserve it for bedtime if you must be awake and alert during the day.

## 2013-10-30 NOTE — Progress Notes (Signed)
   Subjective:    Patient ID: Sandra Booker, female    DOB: Apr 04, 1971, 42 y.o.   MRN: [id number]  PCP: Dow Adolph, MD  Chief Complaint  Patient presents with  . cough, body aches, congestion    x1 week    Medications, allergies, past medical history, surgical history, family history, social history and problem list reviewed and updated.  HPI  Awoke a week ago today with sore throat.  Has lost her voice.  Cough keeps her awake at night, lots of drainage, chest is sore with coughing.  Occasionally productive, but mostly dry, hacking.  Chills. OTC products without relief. No GI/GU symptoms.  Has had flu vaccine this season.  Review of Systems As above.    Objective:   Physical Exam  Vitals reviewed. Constitutional: She is oriented to person, place, and time. Vital signs are normal. She appears well-developed and well-nourished. No distress.  HENT:  Head: Normocephalic and atraumatic.  Right Ear: Hearing, tympanic membrane and external ear normal. A foreign body (cerumen impaction, cleared with irrigation) is present.  Left Ear: Hearing, tympanic membrane, external ear and ear canal normal.  Nose: Mucosal edema and rhinorrhea present.  No foreign bodies. Right sinus exhibits no maxillary sinus tenderness and no frontal sinus tenderness. Left sinus exhibits no maxillary sinus tenderness and no frontal sinus tenderness.  Mouth/Throat: Uvula is midline, oropharynx is clear and moist and mucous membranes are normal. No uvula swelling. No oropharyngeal exudate.  Eyes: Conjunctivae and EOM are normal. Pupils are equal, round, and reactive to light. Right eye exhibits no discharge. Left eye exhibits no discharge. No scleral icterus.  Neck: Trachea normal, normal range of motion and full passive range of motion without pain. Neck supple. No mass and no thyromegaly present.  Cardiovascular: Normal rate, regular rhythm and normal heart sounds.   Pulmonary/Chest: Effort normal and  breath sounds normal.  Lymphadenopathy:       Head (right side): No submandibular, no tonsillar, no preauricular, no posterior auricular and no occipital adenopathy present.       Head (left side): No submandibular, no tonsillar, no preauricular and no occipital adenopathy present.    She has no cervical adenopathy.       Right: No supraclavicular adenopathy present.       Left: No supraclavicular adenopathy present.  Neurological: She is alert and oriented to person, place, and time. She has normal strength. No cranial nerve deficit or sensory deficit.  Skin: Skin is warm, dry and intact. No rash noted.  Psychiatric: She has a normal mood and affect. Her speech is normal and behavior is normal.          Assessment & Plan:  1. Sinusitis Secondary to initial viral URI - ipratropium (ATROVENT) 0.03 % nasal spray; Place 2 sprays into the nose 2 (two) times daily.  Dispense: 30 mL; Refill: 0 - amoxicillin-clavulanate (AUGMENTIN) 875-125 MG per tablet; Take 1 tablet by mouth 2 (two) times daily.  Dispense: 20 tablet; Refill: 0  2. Cough - chlorpheniramine-HYDROcodone (TUSSIONEX PENNKINETIC ER) 10-8 MG/5ML LQCR; Take 5 mLs by mouth every 12 (twelve) hours as needed for cough (cough).  Dispense: 100 mL; Refill: 0  3. Cerumen impaction, right Resolved with irrigation   Fernande Bras, PA-C Physician Assistant-Certified [facility]

## 2013-12-29 ENCOUNTER — Ambulatory Visit (INDEPENDENT_AMBULATORY_CARE_PROVIDER_SITE_OTHER): Payer: BC Managed Care – PPO | Admitting: Family Medicine

## 2013-12-29 ENCOUNTER — Ambulatory Visit: Payer: BC Managed Care – PPO

## 2013-12-29 VITALS — BP 116/60 | HR 82 | Temp 98.2°F | Resp 16 | Ht 63.0 in | Wt 148.6 lb

## 2013-12-29 DIAGNOSIS — IMO0002 Reserved for concepts with insufficient information to code with codable children: Secondary | ICD-10-CM

## 2013-12-29 DIAGNOSIS — M545 Low back pain, unspecified: Secondary | ICD-10-CM

## 2013-12-29 MED ORDER — CYCLOBENZAPRINE HCL 5 MG PO TABS: 5.0000 mg | ORAL_TABLET | Freq: Every evening | ORAL | Status: DC | PRN

## 2013-12-29 MED ORDER — MELOXICAM 7.5 MG PO TABS: 7.5000 mg | ORAL_TABLET | Freq: Two times a day (BID) | ORAL | Status: DC | PRN

## 2013-12-29 MED ORDER — OXYCODONE-ACETAMINOPHEN 5-325 MG PO TABS: 1.0000 | ORAL_TABLET | Freq: Three times a day (TID) | ORAL | Status: DC | PRN

## 2013-12-29 NOTE — Progress Notes (Signed)
Chief Complaint:  Chief Complaint  Patient presents with  . Back Pain    c/o lower back pain x's 2 weeks    HPI: Sandra Booker is a 43 y.o. female who is here for lower back pain that is radiating down her left leg. She states she has a history of having a weak back leading to pulls, strains and pain in her back.She states that the pain has been going on for three months but has gotten worse in the last two the three weeks. She has tried Aleve and Hydrocodone ( whoch she had from root canal)  which has taken the edge off but is not giving her any relief. Denies CP, SOB, numbness, tingling. She states she does have a spasm like pain that comes and goes.  She has no other complaints.Intermittent, sitting can make it worse but so can prolonged standing, She ahs taken aleve and hydrocodone 5/325 without relief which she ahd root canal work done. Sharp, intermittent, lasts a variable amount of time witohout numbness or tingling.  Back xrays done in June 2014 at [facility]por[facility] office Dr Jimmye Norman. She does not have them with her.    Past Medical History  Diagnosis Date  . GERD (gastroesophageal reflux disease)   . IBS (irritable bowel syndrome)   . History of vitamin D deficiency   . History of allergic rhinitis   . History of iron deficiency   . Encounter for insertion of mirena IUD 09/27/2010  . H/O cervicitis   . H/O menorrhagia   . Fibroid   . Hypoglycemia   . UTI (urinary tract infection)     h/o  . Pelvic pain in female     h/o  . Cyst of breast     right breast  . Abnormal Pap smear    Past Surgical History  Procedure Laterality Date  . Myomectomy    . Ankle surgery  06/2011  . Dilation and curettage of uterus  09/29/2005  . Hysteroscopy  09/29/2005   History   Social History  . Marital Status: Married    Spouse Name: Will    Number of Children: 2  . Years of Education: 12+   Occupational History  . TITLE CLERK [employer]   Social History Main Topics   . Smoking status: Never Smoker   . Smokeless tobacco: Never Used  . Alcohol Use: No  . Drug Use: No  . Sexual Activity: Yes    Birth Control/ Protection: IUD   Other Topics Concern  . None   Social History Narrative   Lives with her husband and their 2 children, when they are not at school.   Family History  Problem Relation Age of Onset  . Breast cancer Mother   . Hypertension Mother   . Cancer Mother 17    breast  . Hypertension Father   . Cancer Maternal Grandfather 58    Prostate   No Known Allergies Prior to Admission medications   Medication Sig Start Date End Date Taking? Authorizing Provider  ALPRAZolam Duanne Moron) 0.25 MG tablet Take 1 tablet at bedtime as needed for sleep. 07/24/13  Yes Barton Fanny, MD  hyoscyamine (LEVSIN, ANASPAZ) 0.125 MG tablet TAKE 1 TABLET BY MOUTH EVERY 6 HOURS AS NEEDED FOR CRAMPING 07/24/13  Yes Barton Fanny, MD  ipratropium (ATROVENT) 0.03 % nasal spray Place 2 sprays into the nose 2 (two) times daily. 10/30/13  Yes Fara Chute, PA-C  levonorgestrel (MIRENA) 20 MCG/24HR IUD 1 each by Intrauterine route once.   Yes Historical Provider, MD  Multiple Vitamin (MULTIVITAMIN) tablet Take 1 tablet by mouth daily.   Yes Historical Provider, MD  pantoprazole (PROTONIX) 40 MG tablet Take 1 tablet (40 mg total) by mouth daily. 07/24/13  Yes Barton Fanny, MD  amoxicillin-clavulanate (AUGMENTIN) 875-125 MG per tablet Take 1 tablet by mouth 2 (two) times daily. 10/30/13   Chelle Janalee Dane, PA-C  chlorpheniramine-HYDROcodone (TUSSIONEX PENNKINETIC ER) 10-8 MG/5ML LQCR Take 5 mLs by mouth every 12 (twelve) hours as needed for cough (cough). 10/30/13   Chelle S Jeffery, PA-C     ROS: The patient denies fevers, chills, night sweats, unintentional weight loss, chest pain, palpitations, wheezing, dyspnea on exertion, nausea, vomiting, abdominal pain, dysuria, hematuria, melena, numbness, weakness, or tingling.   All other systems have been  reviewed and were otherwise negative with the exception of those mentioned in the HPI and as above.    PHYSICAL EXAM: Filed Vitals:   12/29/13 1150  BP: 116/60  Pulse: 82  Temp: 98.2 F (36.8 C)  Resp: 16   Filed Vitals:   12/29/13 1150  Height: 5\' 3"  (1.6 m)  Weight: 148 lb 9.6 oz (67.405 kg)   Body mass index is 26.33 kg/(m^2).  General: Alert, no acute distress HEENT:  Normocephalic, atraumatic, oropharynx patent. EOMI, PERRLA Cardiovascular:  Regular rate and rhythm, no rubs murmurs or gallops.  No Carotid bruits, radial pulse intact. No pedal edema.  Respiratory: Clear to auscultation bilaterally.  No wheezes, rales, or rhonchi.  No cyanosis, no use of accessory musculature GI: No organomegaly, abdomen is soft and non-tender, positive bowel sounds.  No masses. Skin: No rashes. Neurologic: Facial musculature symmetric. Psychiatric: Patient is appropriate throughout our interaction. Lymphatic: No cervical lymphadenopathy Musculoskeletal: Gait intact. + paramsk tenderness  Full ROM 5/5 strength, 2/2 DTRs No saddle anesthesia Straight leg negative Hip and knee exam--normal    LABS: Results for orders placed in visit on 10/08/12  PAP IG W/ RFLX HPV ASCU      Result Value Range   Specimen adequacy:       FINAL DIAGNOSIS:       COMMENTS:       Cytotechnologist:       QC Cytotechnologist:         EKG/XRAY:   Primary read interpreted by Dr. Marin Comment at [facility]. No fx/dislocation   ASSESSMENT/PLAN: Encounter Diagnoses  Name Primary?  . Lumbar pain Yes  . Sprain and strain of back    Back exercises given Rx mobic and flexeril For worsening pain may use percocet, she was not rx norco since hydrocodone does not work for her.  F/u her  Gross sideeffects, risk and benefits, and alternatives of medications d/w patient. Patient is aware that all medications have potential sideeffects and we are unable to predict every sideeffect or drug-drug interaction that may  occur.  LE, Paint, DO 12/29/2013 1:15 PM   \

## 2014-01-24 ENCOUNTER — Other Ambulatory Visit: Payer: Self-pay | Admitting: Family Medicine

## 2014-01-24 ENCOUNTER — Ambulatory Visit (INDEPENDENT_AMBULATORY_CARE_PROVIDER_SITE_OTHER): Payer: BC Managed Care – PPO | Admitting: Family Medicine

## 2014-01-24 ENCOUNTER — Ambulatory Visit: Payer: BC Managed Care – PPO

## 2014-01-24 ENCOUNTER — Encounter: Payer: Self-pay | Admitting: Family Medicine

## 2014-01-24 VITALS — BP 99/65 | HR 80 | Temp 98.7°F | Resp 16 | Ht 64.0 in | Wt 149.0 lb

## 2014-01-24 DIAGNOSIS — R0602 Shortness of breath: Secondary | ICD-10-CM

## 2014-01-24 DIAGNOSIS — Z23 Encounter for immunization: Secondary | ICD-10-CM

## 2014-01-24 DIAGNOSIS — M545 Low back pain, unspecified: Secondary | ICD-10-CM

## 2014-01-24 DIAGNOSIS — Z8639 Personal history of other endocrine, nutritional and metabolic disease: Secondary | ICD-10-CM

## 2014-01-24 DIAGNOSIS — Z0000 Encounter for general adult medical examination without abnormal findings: Secondary | ICD-10-CM

## 2014-01-24 DIAGNOSIS — R319 Hematuria, unspecified: Secondary | ICD-10-CM

## 2014-01-24 DIAGNOSIS — IMO0002 Reserved for concepts with insufficient information to code with codable children: Secondary | ICD-10-CM

## 2014-01-24 LAB — POCT URINALYSIS DIPSTICK
Bilirubin, UA: NEGATIVE
Glucose, UA: NEGATIVE
Ketones, UA: NEGATIVE
Leukocytes, UA: NEGATIVE
NITRITE UA: NEGATIVE
Protein, UA: NEGATIVE
Spec Grav, UA: 1.025
Urobilinogen, UA: 0.2
pH, UA: 5.5

## 2014-01-24 LAB — BASIC METABOLIC PANEL
BUN: 11 mg/dL (ref 6–23)
CALCIUM: 10.4 mg/dL (ref 8.4–10.5)
CHLORIDE: 100 meq/L (ref 96–112)
CO2: 27 meq/L (ref 19–32)
Creat: 0.6 mg/dL (ref 0.50–1.10)
Glucose, Bld: 125 mg/dL — ABNORMAL HIGH (ref 70–99)
Potassium: 4.4 mEq/L (ref 3.5–5.3)
SODIUM: 137 meq/L (ref 135–145)

## 2014-01-24 LAB — CBC
HCT: 39.2 % (ref 36.0–46.0)
HEMOGLOBIN: 13.3 g/dL (ref 12.0–15.0)
MCH: 29.8 pg (ref 26.0–34.0)
MCHC: 33.9 g/dL (ref 30.0–36.0)
MCV: 87.7 fL (ref 78.0–100.0)
Platelets: 260 10*3/uL (ref 150–400)
RBC: 4.47 MIL/uL (ref 3.87–5.11)
RDW: 12.9 % (ref 11.5–15.5)
WBC: 7 10*3/uL (ref 4.0–10.5)

## 2014-01-24 MED ORDER — ALPRAZOLAM 0.25 MG PO TABS: ORAL_TABLET | ORAL | Status: DC

## 2014-01-24 MED ORDER — MELOXICAM 7.5 MG PO TABS: 7.5000 mg | ORAL_TABLET | Freq: Two times a day (BID) | ORAL | Status: DC | PRN

## 2014-01-24 MED ORDER — CYCLOBENZAPRINE HCL 5 MG PO TABS: 5.0000 mg | ORAL_TABLET | Freq: Every evening | ORAL | Status: DC | PRN

## 2014-01-24 MED ORDER — PANTOPRAZOLE SODIUM 40 MG PO TBEC: 40.0000 mg | DELAYED_RELEASE_TABLET | Freq: Every day | ORAL | Status: DC

## 2014-01-24 NOTE — Progress Notes (Signed)
Subjective:    Patient ID: Sandra Booker, female    DOB: 07-09-1971, 43 y.o.   MRN: [id number]  HPI  This 43 y.o. AA female presents today for CPE. She states she feels "tired and run down". When asked about fluid intake; she reports less than 12-18 ounces when at work.  She has intermittent tightness across upper back and chest, noting hx of bronchitis and cough which improved w/ MDI. Chronic LBP has been evaluated by chiropractor and pt advised that she has some arthritis in her back along w/ muscle spasms. Stretching exercises are effective when doe on a regular basis. Pt does clerical work.  HCM: PAP- Per GYN (current).           MMG- Current (negative Nov 2014).           IMM- Needs Tdap.           Vision- Biannually.           Dental- Current.  Patient Active Problem List   Diagnosis Date Noted  . History of iron deficiency   . H/O cervicitis   . H/O menorrhagia   . Fibroid   . Hypoglycemia   . UTI (urinary tract infection)   . Pelvic pain in female   . ASCUS favor benign   . GERD (gastroesophageal reflux disease)   . IBS (irritable bowel syndrome)   . History of vitamin D deficiency   . History of allergic rhinitis     Prior to Admission medications   Medication Sig Start Date End Date Taking? Authorizing Provider  ALPRAZolam Duanne Moron) 0.25 MG tablet Take 1 tablet at bedtime as needed for sleep. 01/24/14  Yes Barton Fanny, MD  cyclobenzaprine (FLEXERIL) 5 MG tablet Take 1 tablet (5 mg total) by mouth at bedtime as needed. 3  Yes   hyoscyamine (LEVSIN, ANASPAZ) 0.125 MG tablet TAKE 1 TABLET BY MOUTH EVERY 6 HOURS AS NEEDED FOR CRAMPING 07/24/13  Yes Barton Fanny, MD  ipratropium (ATROVENT) 0.03 % nasal spray Place 2 sprays into the nose 2 (two) times daily. 10/30/13  Yes Chelle Janalee Dane, PA-C  levonorgestrel (MIRENA) 20 MCG/24HR IUD 1 each by Intrauterine route once.   Yes Historical Provider, MD  meloxicam (MOBIC) 7.5 MG tablet Take 1 tablet (7.5 mg total) by  mouth 2 (two) times daily as needed for pain. Take with food, no other NSAIDs   Yes   Multiple Vitamin (MULTIVITAMIN) tablet Take 1 tablet by mouth daily.   Yes Historical Provider, MD  oxyCODONE-acetaminophen (ROXICET) 5-325 MG per tablet Take 1 tablet by mouth every 8 (eight) hours as needed for severe pain. 12/29/13  Yes Thao P Le, DO  pantoprazole (PROTONIX) 40 MG tablet Take 1 tablet (40 mg total) by mouth daily.   Yes Barton Fanny, MD   PMHx, Surg Hx, Soc and Fam Hx reviewed.   Review of Systems  Respiratory: Positive for chest tightness.   Cardiovascular: Positive for chest pain.  Genitourinary:       GYN performs exam.  Musculoskeletal: Positive for back pain and neck pain.  Allergic/Immunologic: Positive for environmental allergies.  All other systems reviewed and are negative.       Objective:   Physical Exam  Nursing note and vitals reviewed. Constitutional: She is oriented to person, place, and time. She appears well-developed and well-nourished. No distress.  Blood pressure borderline low.  HENT:  Head: Normocephalic and atraumatic.  Right Ear: Hearing, tympanic membrane, external ear and  ear canal normal.  Left Ear: Hearing, tympanic membrane, external ear and ear canal normal.  Nose: Nose normal. No nasal deformity or septal deviation.  Mouth/Throat: Uvula is midline, oropharynx is clear and moist and mucous membranes are normal. No oral lesions. Normal dentition.  Eyes: Conjunctivae, EOM and lids are normal. Pupils are equal, round, and reactive to light. No scleral icterus.  Fundoscopic exam:      The right eye shows no hemorrhage and no papilledema. The right eye shows red reflex.       The left eye shows no hemorrhage and no papilledema. The left eye shows red reflex.  Neck: Trachea normal. Neck supple. No JVD present. No spinous process tenderness and no muscular tenderness present. Decreased range of motion present. No mass and no thyromegaly present.    Cardiovascular: Normal rate, regular rhythm, S1 normal, S2 normal, normal heart sounds and normal pulses.   No extrasystoles are present. PMI is not displaced.  Exam reveals no gallop and no friction rub.   No murmur heard. Pulmonary/Chest: Effort normal and breath sounds normal. No respiratory distress.  Abdominal: Soft. Normal appearance and bowel sounds are normal. She exhibits no distension, no abdominal bruit, no pulsatile midline mass and no mass. There is no hepatosplenomegaly. There is no tenderness. There is no guarding and no CVA tenderness. Hernia confirmed negative in the ventral area.  Genitourinary:  Deferred (performed by GYN).  Musculoskeletal: She exhibits no edema and no tenderness.       Cervical back: She exhibits decreased range of motion and spasm. She exhibits no bony tenderness, no swelling, no deformity and no pain.       Thoracic back: She exhibits deformity and spasm.       Lumbar back: She exhibits spasm. She exhibits no deformity.  Curvature of spine (lower thoracic - upper lumbar).   Lymphadenopathy:       Head (right side): No submental, no submandibular, no tonsillar, no preauricular, no posterior auricular and no occipital adenopathy present.       Head (left side): No submental, no submandibular, no tonsillar, no preauricular, no posterior auricular and no occipital adenopathy present.    She has no cervical adenopathy.       Right: No inguinal and no supraclavicular adenopathy present.       Left: No inguinal and no supraclavicular adenopathy present.  Neurological: She is alert and oriented to person, place, and time. She has normal strength and normal reflexes. She displays no atrophy. No cranial nerve deficit or sensory deficit. She exhibits normal muscle tone. She displays a negative Romberg sign. Coordination and gait normal.  Skin: Skin is warm and intact. No bruising, no lesion and no rash noted. She is not diaphoretic. No cyanosis or erythema. Nails show  no clubbing.  Psychiatric: She has a normal mood and affect. Her speech is normal and behavior is normal. Judgment and thought content normal. Cognition and memory are normal.    Results for orders placed in visit on 01/24/14  POCT URINALYSIS DIPSTICK      Result Value Ref Range   Color, UA yellow     Clarity, UA clear     Glucose, UA neg     Bilirubin, UA neg     Ketones, UA neg     Spec Grav, UA 1.025     Blood, UA small     pH, UA 5.5     Protein, UA neg     Urobilinogen, UA 0.2  Nitrite, UA neg     Leukocytes, UA Negative      [facility] reading (PRIMARY) by  Dr. Leward Quan: Thoracic spine- scoliosis and kyphosis w/ early mild degenerative changes. No acute abnormalities in lung fields.     Assessment & Plan:  Routine general medical examination at a health care facility - Plan: POCT urinalysis dipstick, CBC  Hematuria- This is a recurrent issue; no hx of kidney or urinary tract stone. Pt also notes she has a little spotting today.  History of vitamin D deficiency - Plan: Vitamin D, 25-hydroxy  Shortness of breath - Initial evaluation in October 2014 at [facility] after exposure to paint fumes; treated w/ Albuterol Neb >> relief of symptoms. CXR- negative. Prescribed Albuterol MDI which she has not really used since that visit. Plan: Basic metabolic panel, DG Thoracic Spine 2 View  Lumbar pain - Consider PT or Chiropractic treatment. Pt will think about this. Prefers meds. Plan: cyclobenzaprine (FLEXERIL) 5 MG tablet, meloxicam (MOBIC) 7.5 MG tablet  Sprain and strain of back - Plan: cyclobenzaprine (FLEXERIL) 5 MG tablet, meloxicam (MOBIC) 7.5 MG tablet  Need for Tdap vaccination - Plan: Tdap vaccine greater than or equal to 7yo IM  Meds ordered this encounter  Medications  . ALPRAZolam (XANAX) 0.25 MG tablet    Sig: Take 1 tablet at bedtime as needed for sleep.    Dispense:  30 tablet    Refill:  1  . cyclobenzaprine (FLEXERIL) 5 MG tablet    Sig: Take 1 tablet (5 mg  total) by mouth at bedtime as needed.    Dispense:  30 tablet    Refill:  1  . meloxicam (MOBIC) 7.5 MG tablet    Sig: Take 1 tablet (7.5 mg total) by mouth 2 (two) times daily as needed for pain. Take with food, no other NSAIDs    Dispense:  40 tablet    Refill:  1  . pantoprazole (PROTONIX) 40 MG tablet    Sig: Take 1 tablet (40 mg total) by mouth daily.    Dispense:  30 tablet    Refill:  5

## 2014-01-24 NOTE — Patient Instructions (Addendum)
Keeping You Healthy  Get These Tests 1. Blood Pressure- Have your blood pressure checked once a year by your health care provider.  Normal blood pressure is 120/80. 2. Weight- Have your body mass index (BMI) calculated to screen for obesity.  BMI is measure of body fat based on height and weight.  You can also calculate your own BMI at GravelBags.it. 3. Cholesterol- Have your cholesterol checked every 5 years starting at age 45 then yearly starting at age 13. 56. Chlamydia, HIV, and other sexually transmitted diseases- Get screened every year until age 41, then within three months of each new sexual provider. 5. Pap Smear- Every 1-3 years; discuss with your health care provider. 6. Mammogram- Every year starting at age 28  Take these medicines  Calcium with Vitamin D-Your body needs 1200 mg of Calcium each day and [phone] IU of Vitamin D daily.  Your body can only absorb 500 mg of Calcium at a time so Calcium must be taken in 2 or 3 divided doses throughout the day.  Multivitamin with folic acid- Once daily if it is possible for you to become pregnant.  Get these Immunizations  Gardasil-Series of three doses; prevents HPV related illness such as genital warts and cervical cancer.  Menactra-Single dose; prevents meningitis.  Tetanus shot- Every 10 years.  Flu shot-Every year.  Take these steps 1. Do not smoke-Your healthcare provider can help you quit.  For tips on how to quit go to www.smokefree.gov or call 1-800 QUITNOW. 2. Be physically active- Exercise 5 days a week for at least 30 minutes.  If you are not already physically active, start slow and gradually work up to 30 minutes of moderate physical activity.  Examples of moderate activity include walking briskly, dancing, swimming, bicycling, etc. 3. Breast Cancer- A self breast exam every month is important for early detection of breast cancer.  For more information and instruction on self breast exams, ask your  healthcare provider or https://www.patel.info/. 4. Eat a healthy diet- Eat a variety of healthy foods such as fruits, vegetables, whole grains, low fat milk, low fat cheeses, yogurt, lean meats, poultry and fish, beans, nuts, tofu, etc.  For more information go to www. Thenutritionsource.org 5. Drink alcohol in moderation- Limit alcohol intake to one drink or less per day. Never drink and drive. 6. Depression- Your emotional health is as important as your physical health.  If you're feeling down or losing interest in things you normally enjoy please talk to your healthcare provider about being screened for depression. 7. Dental visit- Brush and floss your teeth twice daily; visit your dentist twice a year. 8. Eye doctor- Get an eye exam at least every 2 years. 9. Helmet use- Always wear a helmet when riding a bicycle, motorcycle, rollerblading or skateboarding. 97. Safe sex- If you may be exposed to sexually transmitted infections, use a condom. 11. Seat belts- Seat belts can save your live; always wear one. 12. Smoke/Carbon Monoxide detectors- These detectors need to be installed on the appropriate level of your home. Replace batteries at least once a year. 13. Skin cancer- When out in the sun please cover up and use sunscreen 15 SPF or higher. 14. Violence- If anyone is threatening or hurting you, please tell your healthcare provider.    Scoliosis Scoliosis is the name given to a spine that curves sideways.Scoliosis can cause twisting of your shoulders, hips, chest, back, and rib cage.  CAUSES  The cause of scoliosis is not always known. It may  be caused by a birth defect or by a disease that can cause muscular dysfunction and imbalance, such as cerebral palsy and muscular dystrophy.  RISK FACTORS Having a disease that causes muscle disease or dysfunction. SIGNS AND SYMPTOMS Scoliosis often has no signs or symptoms.If they are present, they may include:  Unequal size  of one body side compared to the other (asymmetry).  Visible curvature of the spine.  Pain. The pain may limit physical activity.  Shortness of breath.  Bowel or bladder issues. DIAGNOSIS A skilled health care provider will perform an evaluation. This will involve:  Taking your history.  Performing a physical examination.  Performing neurological exam to detect nerve or muscle function loss.  Range of motion studies on the spine.  X-rays. An MRI may also be obtained. TREATMENT  Treatment varies depending on the nature, extent, and severity of the disease. If the curvature is not great, you may need only observation. A brace may be used to prevent scoliosis from progressing. A brace may also be needed during growth spurts. Physical therapy may be of benefit. Surgery may be required.  HOME CARE INSTRUCTIONS   Your health care provider may suggest exercises to strengthen your muscles. Perform them as directed.  Ask your health care provider before participating in any sports.   If you have been prescribed an orthopedic brace, wear it as instructed by your health care provider. SEEK MEDICAL CARE IF: Your brace causes the skin to become sore (chafe) or is uncomfortable.  SEEK IMMEDIATE MEDICAL CARE IF:  You have back pain that is not relieved by the medicines prescribed by your health care provider.   Your legs feel weak or you lose function in your legs.  You lose some bowel or bladder control.  Document Released: 11/04/2000 Document Revised: 08/28/2013 Document Reviewed: 07/15/2013 [facility] Patient Information 2014 [place].   You are not drinking enough fluids; try to drink more water, juices and other liquids throughout the day. This will help you feel much better and increase your blood pressure.

## 2014-01-25 ENCOUNTER — Other Ambulatory Visit: Payer: Self-pay | Admitting: Family Medicine

## 2014-01-25 ENCOUNTER — Encounter: Payer: Self-pay | Admitting: Family Medicine

## 2014-01-25 LAB — VITAMIN D 25 HYDROXY (VIT D DEFICIENCY, FRACTURES): Vit D, 25-Hydroxy: 16 ng/mL — ABNORMAL LOW (ref 30–89)

## 2014-01-25 MED ORDER — VITAMIN D3 250 MCG (10000 UT) PO CAPS: ORAL_CAPSULE | ORAL | Status: DC

## 2014-01-27 ENCOUNTER — Encounter: Payer: Self-pay | Admitting: Family Medicine

## 2014-01-27 ENCOUNTER — Telehealth: Payer: Self-pay | Admitting: Family Medicine

## 2014-01-27 LAB — HEMOGLOBIN A1C
HEMOGLOBIN A1C: 5.9 % — AB (ref <5.7)
Mean Plasma Glucose: 123 mg/dL — ABNORMAL HIGH (ref <117)

## 2014-01-27 NOTE — Telephone Encounter (Signed)
Called [facility] to add A1C to patients labs

## 2014-03-25 ENCOUNTER — Ambulatory Visit (INDEPENDENT_AMBULATORY_CARE_PROVIDER_SITE_OTHER): Payer: BC Managed Care – PPO | Admitting: Family Medicine

## 2014-03-25 VITALS — BP 108/68 | HR 86 | Temp 98.5°F | Resp 16 | Ht 64.0 in | Wt 153.8 lb

## 2014-03-25 DIAGNOSIS — IMO0001 Reserved for inherently not codable concepts without codable children: Secondary | ICD-10-CM

## 2014-03-25 DIAGNOSIS — Z8639 Personal history of other endocrine, nutritional and metabolic disease: Secondary | ICD-10-CM

## 2014-03-25 DIAGNOSIS — M7918 Myalgia, other site: Secondary | ICD-10-CM

## 2014-03-25 DIAGNOSIS — M545 Low back pain, unspecified: Secondary | ICD-10-CM

## 2014-03-25 DIAGNOSIS — IMO0002 Reserved for concepts with insufficient information to code with codable children: Secondary | ICD-10-CM

## 2014-03-25 MED ORDER — OXYCODONE-ACETAMINOPHEN 5-325 MG PO TABS: 1.0000 | ORAL_TABLET | Freq: Three times a day (TID) | ORAL | Status: DC | PRN

## 2014-03-25 MED ORDER — PREDNISONE 10 MG PO TABS: ORAL_TABLET | ORAL | Status: DC

## 2014-03-25 NOTE — Progress Notes (Signed)
This chart was scribed for [name] MD by [name], ED Scribe. This patient was seen in room 12 and the patient's care was started at 1:24 PM .  Subjective:    Patient ID: Sandra Booker, female    DOB: Jul 08, 1971, 42 y.o.   MRN: [id number] Chief Complaint  Patient presents with  . Back Pain    going down into left leg sxs on/off x 6 mos    HPI   HPI Comments: Sandra Booker is a 43 y.o. female who presents to [facility] complaining of recurrent left lower back and buttock pain.  She has been seen here prev by [name] and [name] for the same in the past few mos as pain was first noticed about 6 mos prior and continues to worsen in a waxing/waning pattern. The pain is constant and radiates down her left leg to her knee when she stands ups as well as shoots over to the right side of her low back when severe. She states that while her back pain in constant, it can "flare" to the point that "she cannot walk". There have been 3 episodes of flares which are usually decreased by using nsaids, muscle relaxants, and oxycodone - tries to not use the meds more than 3x/wk only during flairs and takes nothing continually.  She reports associated "tingling" but no numbness .  Is having some weakness due to pain but not intrinsic.  She reports that she has not been to work this week due to the pain.   No f/c, no saddle asesthesia, no change in bowels or bladder. Has normal lumber xray sev mos ago and thoracic xray showed some mild scoliosis and kyphosis. No other imaging.  Pt states that the pain is so bad, that she can barely walk. Pt had been taking Mobic 3x a week, flexeril 3x a week   Pt has been to chiropractor 6 months ago for shoulder and back pain. She was seen 3 months ago by Dr. Nedra Booker for lower back pain that was radiating down her left leg. Dr Sandra Booker prescribed her percocet, the pt reports some relief. She had back XRAY done June 2014, the chiropractor reported  "scar tissue in her neck and that she is not aligned correctly".   Past Medical History  Diagnosis Date  . GERD (gastroesophageal reflux disease)   . IBS (irritable bowel syndrome)   . History of vitamin D deficiency   . History of allergic rhinitis   . History of iron deficiency   . Encounter for insertion of mirena IUD 09/27/2010  . H/O cervicitis   . H/O menorrhagia   . Fibroid   . Hypoglycemia   . UTI (urinary tract infection)     h/o  . Pelvic pain in female     h/o  . Cyst of breast     right breast  . Abnormal Pap smear   . Allergy   . Anemia   . Anxiety   . Arthritis    Current Outpatient Prescriptions on File Prior to Visit  Medication Sig Dispense Refill  . ALPRAZolam (XANAX) 0.25 MG tablet Take 1 tablet at bedtime as needed for sleep.  30 tablet  1  . Cholecalciferol (VITAMIN D3) 10000 UNITS capsule Take 1 capsule by mouth three times a week.  15 capsule  3  . cyclobenzaprine (FLEXERIL) 5 MG tablet Take 1 tablet (5 mg total) by mouth at bedtime as needed.  30 tablet  1  . hyoscyamine (LEVSIN, ANASPAZ) 0.125 MG tablet TAKE 1 TABLET BY MOUTH EVERY 6 HOURS AS NEEDED FOR CRAMPING  30 tablet  5  . ipratropium (ATROVENT) 0.03 % nasal spray Place 2 sprays into the nose 2 (two) times daily.  30 mL  0  . levonorgestrel (MIRENA) 20 MCG/24HR IUD 1 each by Intrauterine route once.      . meloxicam (MOBIC) 7.5 MG tablet Take 1 tablet (7.5 mg total) by mouth 2 (two) times daily as needed for pain. Take with food, no other NSAIDs  40 tablet  1  . Multiple Vitamin (MULTIVITAMIN) tablet Take 1 tablet by mouth daily.      . pantoprazole (PROTONIX) 40 MG tablet Take 1 tablet (40 mg total) by mouth daily.  30 tablet  5   No current facility-administered medications on file prior to visit.    No Known Allergies    Review of Systems  Constitutional: Negative for fever, chills and activity change.  HENT: Negative for congestion, rhinorrhea, sneezing and sore throat.   Respiratory:  Negative for cough.   Cardiovascular: Negative for chest pain.  Gastrointestinal: Negative for nausea, abdominal pain and diarrhea.  Genitourinary: Negative for dysuria and difficulty urinating.  Musculoskeletal: Positive for back pain and gait problem.  Neurological: Negative for weakness and numbness.  Psychiatric/Behavioral: Negative for confusion.       Objective:   Physical Exam  Nursing note and vitals reviewed. Constitutional: She is oriented to person, place, and time. She appears well-developed and well-nourished.  HENT:  Head: Normocephalic and atraumatic.  Eyes: Conjunctivae and EOM are normal. No scleral icterus.  Neck: Normal range of motion. Neck supple. No thyromegaly present.  Cardiovascular: Normal rate and regular rhythm.  Exam reveals no gallop and no friction rub.   No murmur heard. Pulmonary/Chest: Effort normal and breath sounds normal. No stridor. She has no wheezes. She has no rales. She exhibits no tenderness.  Abdominal: She exhibits no distension. There is no tenderness. There is no rebound.  Musculoskeletal: Normal range of motion. She exhibits no edema.  No significant pain lumbar spinous process or para spinous muscles. No pain lateral hip  2+ achilles and DTR's  Lymphadenopathy:    She has no cervical adenopathy.  Neurological: She is alert and oriented to person, place, and time. She exhibits normal muscle tone. Coordination normal.  5/5 muscle test on right.  4+/5 strength on hamstrings on left side.  5/5 strength dorsiflexion.  Skin: No rash noted. No erythema.  Psychiatric: She has a normal mood and affect. Her behavior is normal.       Filed Vitals:   03/25/14 1245  BP: 108/68  Pulse: 86  Temp: 98.5 F (36.9 C)  Resp: 16        Assessment & Plan:  1:36 PM-Discussed treatment plan with pt at bedside and pt agreed to plan.   Lumbago  Gluteal pain - does not seem be sciatica but wonder if this could have started out as a lumbar  strain that is now resulting in gluteal spasm or poss pyriformis syndrome.  Will try therapeutic trial of prednisone taper.  Lumbar pain - Plan: oxyCODONE-acetaminophen (ROXICET) 5-325 MG per tablet - refilled - pt using sparingly has still has some left from the #20 rx'ed 3 mos prev.  Advised pt that I suspect she will only get temporary relief w/ any meds - for actual resolution of her sxs she will likely need PT - pt declines referral today but  will f/u w/ Dr. Everlene Farrier next mo in sched appt to discuss. Handout given for home exercises which she will try. Consider pelvic/SI joint xray at f/u, consider check esr.  Pt will cont vit D supp.  Sprain and strain of back - Plan: oxyCODONE-acetaminophen (ROXICET) 5-325 MG per tablet  Meds ordered this encounter  Medications  . oxyCODONE-acetaminophen (ROXICET) 5-325 MG per tablet    Sig: Take 1 tablet by mouth every 8 (eight) hours as needed for severe pain.    Dispense:  20 tablet    Refill:  0  . predniSONE (DELTASONE) 10 MG tablet    Sig: 6-5-4-3-2-1 po qd taper x 6d    Dispense:  21 tablet    Refill:  0    I personally performed the services described in this documentation, which was scribed in my presence. The recorded information has been reviewed and considered, and addended by me as needed.  Delman Cheadle, MD MPH

## 2014-03-25 NOTE — Patient Instructions (Signed)
Start taking the meloxicam DAILY AFTER the prednisone course is complete.  I think that you are unlikely to sustain any long term relief without physical therapy.  Start doing the below exercises along with heat and hopefully you can get some excellent relief that way. If you continue to have any pain, please follow-up to discuss need for further imaging, labs, or referral. Make sure you are taking your vitamin D and getting gentle slow exercise.  Sciatica with Rehab The sciatic nerve runs from the back down the leg and is responsible for sensation and control of the muscles in the back (posterior) side of the thigh, lower leg, and foot. Sciatica is a condition that is characterized by inflammation of this nerve.  SYMPTOMS   Signs of nerve damage, including numbness and/or weakness along the posterior side of the lower extremity.  Pain in the back of the thigh that may also travel down the leg.  Pain that worsens when sitting for long periods of time.  Occasionally, pain in the back or buttock. CAUSES  Inflammation of the sciatic nerve is the cause of sciatica. The inflammation is due to something irritating the nerve. Common sources of irritation include:  Sitting for long periods of time.  Direct trauma to the nerve.  Arthritis of the spine.  Herniated or ruptured disk.  Slipping of the vertebrae (spondylolithesis)  Pressure from soft tissues, such as muscles or ligament-like tissue (fascia). RISK INCREASES WITH:  Sports that place pressure or stress on the spine (football or weightlifting).  Poor strength and flexibility.  Failure to warm-up properly before activity.  Family history of low back pain or disk disorders.  Previous back injury or surgery.  Poor body mechanics, especially when lifting, or poor posture. PREVENTION   Warm up and stretch properly before activity.  Maintain physical fitness:  Strength, flexibility, and endurance.  Cardiovascular  fitness.  Learn and use proper technique, especially with posture and lifting. When possible, have coach correct improper technique.  Avoid activities that place stress on the spine. PROGNOSIS If treated properly, then sciatica usually resolves within 6 weeks. However, occasionally surgery is necessary.  RELATED COMPLICATIONS   Permanent nerve damage, including pain, numbness, tingle, or weakness.  Chronic back pain.  Risks of surgery: infection, bleeding, nerve damage, or damage to surrounding tissues. TREATMENT Treatment initially involves resting from any activities that aggravate your symptoms. The use of ice and medication may help reduce pain and inflammation. The use of strengthening and stretching exercises may help reduce pain with activity. These exercises may be performed at home or with referral to a therapist. A therapist may recommend further treatments, such as transcutaneous electronic nerve stimulation (TENS) or ultrasound. Your caregiver may recommend corticosteroid injections to help reduce inflammation of the sciatic nerve. If symptoms persist despite non-surgical (conservative) treatment, then surgery may be recommended. MEDICATION  If pain medication is necessary, then nonsteroidal anti-inflammatory medications, such as aspirin and ibuprofen, or other minor pain relievers, such as acetaminophen, are often recommended.  Do not take pain medication for 7 days before surgery.  Prescription pain relievers may be given if deemed necessary by your caregiver. Use only as directed and only as much as you need.  Ointments applied to the skin may be helpful.  Corticosteroid injections may be given by your caregiver. These injections should be reserved for the most serious cases, because they may only be given a certain number of times. HEAT AND COLD  Cold treatment (icing) relieves pain and reduces  inflammation. Cold treatment should be applied for 10 to 15 minutes every 2 to  3 hours for inflammation and pain and immediately after any activity that aggravates your symptoms. Use ice packs or massage the area with a piece of ice (ice massage).  Heat treatment may be used prior to performing the stretching and strengthening activities prescribed by your caregiver, physical therapist, or athletic trainer. Use a heat pack or soak the injury in warm water. SEEK MEDICAL CARE IF:  Treatment seems to offer no benefit, or the condition worsens.  Any medications produce adverse side effects. EXERCISES  RANGE OF MOTION (ROM) AND STRETCHING EXERCISES - Sciatica Most people with sciatic will find that their symptoms worsen with either excessive bending forward (flexion) or arching at the low back (extension). The exercises which will help resolve your symptoms will focus on the opposite motion. Your physician, physical therapist or athletic trainer will help you determine which exercises will be most helpful to resolve your low back pain. Do not complete any exercises without first consulting with your clinician. Discontinue any exercises which worsen your symptoms until you speak to your clinician. If you have pain, numbness or tingling which travels down into your buttocks, leg or foot, the goal of the therapy is for these symptoms to move closer to your back and eventually resolve. Occasionally, these leg symptoms will get better, but your low back pain may worsen; this is typically an indication of progress in your rehabilitation. Be certain to be very alert to any changes in your symptoms and the activities in which you participated in the 24 hours prior to the change. Sharing this information with your clinician will allow him/her to most efficiently treat your condition. These exercises may help you when beginning to rehabilitate your injury. Your symptoms may resolve with or without further involvement from your physician, physical therapist or athletic trainer. While completing  these exercises, remember:   Restoring tissue flexibility helps normal motion to return to the joints. This allows healthier, less painful movement and activity.  An effective stretch should be held for at least 30 seconds.  A stretch should never be painful. You should only feel a gentle lengthening or release in the stretched tissue. FLEXION RANGE OF MOTION AND STRETCHING EXERCISES: STRETCH  Flexion, Single Knee to Chest   Lie on a firm bed or floor with both legs extended in front of you.  Keeping one leg in contact with the floor, bring your opposite knee to your chest. Hold your leg in place by either grabbing behind your thigh or at your knee.  Pull until you feel a gentle stretch in your low back. Hold __________ seconds.  Slowly release your grasp and repeat the exercise with the opposite side. Repeat __________ times. Complete this exercise __________ times per day.  STRETCH  Flexion, Double Knee to Chest  Lie on a firm bed or floor with both legs extended in front of you.  Keeping one leg in contact with the floor, bring your opposite knee to your chest.  Tense your stomach muscles to support your back and then lift your other knee to your chest. Hold your legs in place by either grabbing behind your thighs or at your knees.  Pull both knees toward your chest until you feel a gentle stretch in your low back. Hold __________ seconds.  Tense your stomach muscles and slowly return one leg at a time to the floor. Repeat __________ times. Complete this exercise __________ times per  day.  STRETCH  Low Trunk Rotation   Lie on a firm bed or floor. Keeping your legs in front of you, bend your knees so they are both pointed toward the ceiling and your feet are flat on the floor.  Extend your arms out to the side. This will stabilize your upper body by keeping your shoulders in contact with the floor.  Gently and slowly drop both knees together to one side until you feel a gentle  stretch in your low back. Hold for __________ seconds.  Tense your stomach muscles to support your low back as you bring your knees back to the starting position. Repeat the exercise to the other side. Repeat __________ times. Complete this exercise __________ times per day  EXTENSION RANGE OF MOTION AND FLEXIBILITY EXERCISES: STRETCH  Extension, Prone on Elbows  Lie on your stomach on the floor, a bed will be too soft. Place your palms about shoulder width apart and at the height of your head.  Place your elbows under your shoulders. If this is too painful, stack pillows under your chest.  Allow your body to relax so that your hips drop lower and make contact more completely with the floor.  Hold this position for __________ seconds.  Slowly return to lying flat on the floor. Repeat __________ times. Complete this exercise __________ times per day.  RANGE OF MOTION  Extension, Prone Press Ups  Lie on your stomach on the floor, a bed will be too soft. Place your palms about shoulder width apart and at the height of your head.  Keeping your back as relaxed as possible, slowly straighten your elbows while keeping your hips on the floor. You may adjust the placement of your hands to maximize your comfort. As you gain motion, your hands will come more underneath your shoulders.  Hold this position __________ seconds.  Slowly return to lying flat on the floor. Repeat __________ times. Complete this exercise __________ times per day.  STRENGTHENING EXERCISES - Sciatica  These exercises may help you when beginning to rehabilitate your injury. These exercises should be done near your "sweet spot." This is the neutral, low-back arch, somewhere between fully rounded and fully arched, that is your least painful position. When performed in this safe range of motion, these exercises can be used for people who have either a flexion or extension based injury. These exercises may resolve your symptoms  with or without further involvement from your physician, physical therapist or athletic trainer. While completing these exercises, remember:   Muscles can gain both the endurance and the strength needed for everyday activities through controlled exercises.  Complete these exercises as instructed by your physician, physical therapist or athletic trainer. Progress with the resistance and repetition exercises only as your caregiver advises.  You may experience muscle soreness or fatigue, but the pain or discomfort you are trying to eliminate should never worsen during these exercises. If this pain does worsen, stop and make certain you are following the directions exactly. If the pain is still present after adjustments, discontinue the exercise until you can discuss the trouble with your clinician. STRENGTHENING Deep Abdominals, Pelvic Tilt   Lie on a firm bed or floor. Keeping your legs in front of you, bend your knees so they are both pointed toward the ceiling and your feet are flat on the floor.  Tense your lower abdominal muscles to press your low back into the floor. This motion will rotate your pelvis so that your tail bone  is scooping upwards rather than pointing at your feet or into the floor.  With a gentle tension and even breathing, hold this position for __________ seconds. Repeat __________ times. Complete this exercise __________ times per day.  STRENGTHENING  Abdominals, Crunches   Lie on a firm bed or floor. Keeping your legs in front of you, bend your knees so they are both pointed toward the ceiling and your feet are flat on the floor. Cross your arms over your chest.  Slightly tip your chin down without bending your neck.  Tense your abdominals and slowly lift your trunk high enough to just clear your shoulder blades. Lifting higher can put excessive stress on the low back and does not further strengthen your abdominal muscles.  Control your return to the starting  position. Repeat __________ times. Complete this exercise __________ times per day.  STRENGTHENING  Quadruped, Opposite UE/LE Lift  Assume a hands and knees position on a firm surface. Keep your hands under your shoulders and your knees under your hips. You may place padding under your knees for comfort.  Find your neutral spine and gently tense your abdominal muscles so that you can maintain this position. Your shoulders and hips should form a rectangle that is parallel with the floor and is not twisted.  Keeping your trunk steady, lift your right hand no higher than your shoulder and then your left leg no higher than your hip. Make sure you are not holding your breath. Hold this position __________ seconds.  Continuing to keep your abdominal muscles tense and your back steady, slowly return to your starting position. Repeat with the opposite arm and leg. Repeat __________ times. Complete this exercise __________ times per day.  STRENGTHENING  Abdominals and Quadriceps, Straight Leg Raise   Lie on a firm bed or floor with both legs extended in front of you.  Keeping one leg in contact with the floor, bend the other knee so that your foot can rest flat on the floor.  Find your neutral spine, and tense your abdominal muscles to maintain your spinal position throughout the exercise.  Slowly lift your straight leg off the floor about 6 inches for a count of 15, making sure to not hold your breath.  Still keeping your neutral spine, slowly lower your leg all the way to the floor. Repeat this exercise with each leg __________ times. Complete this exercise __________ times per day. POSTURE AND BODY MECHANICS CONSIDERATIONS - Sciatica Keeping correct posture when sitting, standing or completing your activities will reduce the stress put on different body tissues, allowing injured tissues a chance to heal and limiting painful experiences. The following are general guidelines for improved posture.  Your physician or physical therapist will provide you with any instructions specific to your needs. While reading these guidelines, remember:  The exercises prescribed by your provider will help you have the flexibility and strength to maintain correct postures.  The correct posture provides the optimal environment for your joints to work. All of your joints have less wear and tear when properly supported by a spine with good posture. This means you will experience a healthier, less painful body.  Correct posture must be practiced with all of your activities, especially prolonged sitting and standing. Correct posture is as important when doing repetitive low-stress activities (typing) as it is when doing a single heavy-load activity (lifting). RESTING POSITIONS Consider which positions are most painful for you when choosing a resting position. If you have pain with flexion-based  activities (sitting, bending, stooping, squatting), choose a position that allows you to rest in a less flexed posture. You would want to avoid curling into a fetal position on your side. If your pain worsens with extension-based activities (prolonged standing, working overhead), avoid resting in an extended position such as sleeping on your stomach. Most people will find more comfort when they rest with their spine in a more neutral position, neither too rounded nor too arched. Lying on a non-sagging bed on your side with a pillow between your knees, or on your back with a pillow under your knees will often provide some relief. Keep in mind, being in any one position for a prolonged period of time, no matter how correct your posture, can still lead to stiffness. PROPER SITTING POSTURE In order to minimize stress and discomfort on your spine, you must sit with correct posture Sitting with good posture should be effortless for a healthy body. Returning to good posture is a gradual process. Many people can work toward this most  comfortably by using various supports until they have the flexibility and strength to maintain this posture on their own. When sitting with proper posture, your ears will fall over your shoulders and your shoulders will fall over your hips. You should use the back of the chair to support your upper back. Your low back will be in a neutral position, just slightly arched. You may place a small pillow or folded towel at the base of your low back for support.  When working at a desk, create an environment that supports good, upright posture. Without extra support, muscles fatigue and lead to excessive strain on joints and other tissues. Keep these recommendations in mind: CHAIR:   A chair should be able to slide under your desk when your back makes contact with the back of the chair. This allows you to work closely.  The chair's height should allow your eyes to be level with the upper part of your monitor and your hands to be slightly lower than your elbows. BODY POSITION  Your feet should make contact with the floor. If this is not possible, use a foot rest.  Keep your ears over your shoulders. This will reduce stress on your neck and low back. INCORRECT SITTING POSTURES   If you are feeling tired and unable to assume a healthy sitting posture, do not slouch or slump. This puts excessive strain on your back tissues, causing more damage and pain. Healthier options include:  Using more support, like a lumbar pillow.  Switching tasks to something that requires you to be upright or walking.  Talking a brief walk.  Lying down to rest in a neutral-spine position. PROLONGED STANDING WHILE SLIGHTLY LEANING FORWARD  When completing a task that requires you to lean forward while standing in one place for a long time, place either foot up on a stationary 2-4 inch high object to help maintain the best posture. When both feet are on the ground, the low back tends to lose its slight inward curve. If this  curve flattens (or becomes too large), then the back and your other joints will experience too much stress, fatigue more quickly and can cause pain.  CORRECT STANDING POSTURES Proper standing posture should be assumed with all daily activities, even if they only take a few moments, like when brushing your teeth. As in sitting, your ears should fall over your shoulders and your shoulders should fall over your hips. You should keep a slight  tension in your abdominal muscles to brace your spine. Your tailbone should point down to the ground, not behind your body, resulting in an over-extended swayback posture.  INCORRECT STANDING POSTURES  Common incorrect standing postures include a forward head, locked knees and/or an excessive swayback. WALKING Walk with an upright posture. Your ears, shoulders and hips should all line-up. PROLONGED ACTIVITY IN A FLEXED POSITION When completing a task that requires you to bend forward at your waist or lean over a low surface, try to find a way to stabilize 3 of 4 of your limbs. You can place a hand or elbow on your thigh or rest a knee on the surface you are reaching across. This will provide you more stability so that your muscles do not fatigue as quickly. By keeping your knees relaxed, or slightly bent, you will also reduce stress across your low back. CORRECT LIFTING TECHNIQUES DO :   Assume a wide stance. This will provide you more stability and the opportunity to get as close as possible to the object which you are lifting.  Tense your abdominals to brace your spine; then bend at the knees and hips. Keeping your back locked in a neutral-spine position, lift using your leg muscles. Lift with your legs, keeping your back straight.  Test the weight of unknown objects before attempting to lift them.  Try to keep your elbows locked down at your sides in order get the best strength from your shoulders when carrying an object.  Always ask for help when lifting  heavy or awkward objects. INCORRECT LIFTING TECHNIQUES DO NOT:   Lock your knees when lifting, even if it is a small object.  Bend and twist. Pivot at your feet or move your feet when needing to change directions.  Assume that you cannot safely pick up a paperclip without proper posture. Document Released: 11/07/2005 Document Revised: 01/30/2012 Document Reviewed: 02/19/2009 [facility] Patient Information 2014 [place], [place].

## 2014-05-01 ENCOUNTER — Other Ambulatory Visit: Payer: Self-pay | Admitting: Family Medicine

## 2014-05-21 ENCOUNTER — Other Ambulatory Visit: Payer: Self-pay | Admitting: Family Medicine

## 2014-05-30 ENCOUNTER — Ambulatory Visit: Payer: BC Managed Care – PPO | Admitting: Family Medicine

## 2014-06-13 ENCOUNTER — Ambulatory Visit (INDEPENDENT_AMBULATORY_CARE_PROVIDER_SITE_OTHER): Payer: BC Managed Care – PPO | Admitting: Family Medicine

## 2014-06-13 ENCOUNTER — Encounter: Payer: Self-pay | Admitting: Family Medicine

## 2014-06-13 VITALS — BP 103/67 | HR 83 | Temp 98.8°F | Resp 16 | Ht 64.0 in | Wt 152.0 lb

## 2014-06-13 DIAGNOSIS — E559 Vitamin D deficiency, unspecified: Secondary | ICD-10-CM

## 2014-06-13 DIAGNOSIS — M25559 Pain in unspecified hip: Secondary | ICD-10-CM

## 2014-06-13 DIAGNOSIS — M25552 Pain in left hip: Secondary | ICD-10-CM

## 2014-06-14 LAB — VITAMIN D 25 HYDROXY (VIT D DEFICIENCY, FRACTURES): VIT D 25 HYDROXY: 56 ng/mL (ref 30–89)

## 2014-06-15 NOTE — Progress Notes (Signed)
S:  This 43 y.o. AA female was diagnosed w/ Vit D def in March 2015. She has been taking Vitamin D3 10000 units 3 times /week. She feels better and has no adverse medication effects. Pt does have c/o posterior L hip pain; on previous visits, lumbar spine (negative) and thoracic spine films (mild lower thoracic scoliosis) were obtained. Medications prescribed in May for lumbago are moderately effective. She does not take Oxycodone; pain is not severe. Pt denies weakness or numbness or abnormal gait. The pain does not interrupt her sleep.  Patient Active Problem List   Diagnosis Date Noted  . History of iron deficiency   . H/O cervicitis   . H/O menorrhagia   . Fibroid   . Hypoglycemia   . UTI (urinary tract infection)   . Pelvic pain in female   . ASCUS favor benign   . GERD (gastroesophageal reflux disease)   . IBS (irritable bowel syndrome)   . History of vitamin D deficiency   . History of allergic rhinitis     Outpatient Encounter Prescriptions as of 06/13/2014  Medication Sig  . ALPRAZolam (XANAX) 0.25 MG tablet Take 1 tablet at bedtime as needed for sleep.  . Cholecalciferol (VITAMIN D3) 10000 UNITS capsule Take 1 capsule by mouth three times a week.  . cyclobenzaprine (FLEXERIL) 5 MG tablet TAKE 1 TABLET (5 MG TOTAL) BY MOUTH AT BEDTIME AS NEEDED.  . hyoscyamine (LEVSIN, ANASPAZ) 0.125 MG tablet TAKE 1 TABLET BY MOUTH EVERY 6 HOURS AS NEEDED FOR CRAMPING  . ipratropium (ATROVENT) 0.03 % nasal spray Place 2 sprays into the nose 2 (two) times daily.  Marland Kitchen levonorgestrel (MIRENA) 20 MCG/24HR IUD 1 each by Intrauterine route once.  . meloxicam (MOBIC) 7.5 MG tablet TAKE 1 TABLET BY MOUTH TWICE A DAY AS NEEDED FOR PAIN. TAKE WITH FOOD. NO OTHER NSAIDS  . Multiple Vitamin (MULTIVITAMIN) tablet Take 1 tablet by mouth daily.  Marland Kitchen oxyCODONE-acetaminophen (ROXICET) 5-325 MG per tablet Take 1 tablet by mouth every 8 (eight) hours as needed for severe pain.  . pantoprazole (PROTONIX) 40 MG tablet  Take 1 tablet (40 mg total) by mouth daily.    PMHx, Surg Hx, Soc and Fam Hx reviewed.  ROS: As per HPI.  O: Filed Vitals:   06/13/14 0911  BP: 103/67  Pulse: 83  Temp: 98.8 F (37.1 C)  Resp: 16   GEN: in NAD; WN,WD. HENT: [place]/AT; EOMI w/ clear conj/sclerae. Otherwise unremarkable. COR: RRR. LUNGS: Unlabored resp. BACK: Spine has mild thoracic curve w/ mild muscle spasms. L posterior SI joint tender w/ moderate palpation. Flexibility is normal. NEURO: A&O x 3; Cns intact. Gait is normal. Motor function grossly normal. Nonfocal.  A/P: Unspecified vitamin D deficiency - Plan: Vitamin D, 25-hydroxy  Posterior pain of hip, left- Continue current medications. If condition worsens, consider chiropractic evaluation and treatment or PT. Pt agrees.

## 2014-07-03 ENCOUNTER — Other Ambulatory Visit: Payer: Self-pay | Admitting: Family Medicine

## 2014-07-04 ENCOUNTER — Other Ambulatory Visit: Payer: Self-pay

## 2014-07-04 MED ORDER — VITAMIN D3 250 MCG (10000 UT) PO CAPS: ORAL_CAPSULE | ORAL | Status: DC

## 2014-07-18 ENCOUNTER — Telehealth: Payer: Self-pay

## 2014-07-18 NOTE — Telephone Encounter (Signed)
Spoke to pt she is going to call and try to schedule an appt on her own.  She will RTC if needed.

## 2014-07-18 NOTE — Telephone Encounter (Signed)
Dr.Mcpherson, Pt states that her sciatic nerve is beginning to bother her again, she states that the pain radiates from her back down to her leg. She would like to know if she could possibly get a referral to a chiropractor.  Best# [phone]

## 2014-09-02 ENCOUNTER — Other Ambulatory Visit: Payer: Self-pay

## 2014-09-02 DIAGNOSIS — Z1239 Encounter for other screening for malignant neoplasm of breast: Secondary | ICD-10-CM

## 2014-09-22 ENCOUNTER — Encounter: Payer: Self-pay | Admitting: Family Medicine

## 2014-09-24 ENCOUNTER — Other Ambulatory Visit: Payer: Self-pay

## 2014-09-24 ENCOUNTER — Ambulatory Visit
Admission: RE | Admit: 2014-09-24 | Discharge: 2014-09-24 | Disposition: A | Discharge status: Home / Self Care | Payer: BC Managed Care – PPO | Source: Ambulatory Visit

## 2014-09-24 DIAGNOSIS — Z1231 Encounter for screening mammogram for malignant neoplasm of breast: Secondary | ICD-10-CM

## 2014-10-01 ENCOUNTER — Other Ambulatory Visit: Payer: Self-pay | Admitting: Family Medicine

## 2014-10-02 ENCOUNTER — Other Ambulatory Visit: Payer: Self-pay | Admitting: Family Medicine

## 2014-10-02 NOTE — Telephone Encounter (Signed)
Hyoscyamine medication refilled.

## 2014-10-02 NOTE — Telephone Encounter (Signed)
Dr Leward Quan, you saw pt in July but I don't see this med discussed recently. Do you want to RF?

## 2014-10-13 ENCOUNTER — Other Ambulatory Visit: Payer: Self-pay | Admitting: Family Medicine

## 2015-01-16 ENCOUNTER — Encounter: Payer: Self-pay | Admitting: Family Medicine

## 2015-01-20 ENCOUNTER — Other Ambulatory Visit: Payer: Self-pay | Admitting: Family Medicine

## 2015-01-22 ENCOUNTER — Encounter: Payer: Self-pay | Admitting: Family Medicine

## 2015-01-23 ENCOUNTER — Ambulatory Visit (INDEPENDENT_AMBULATORY_CARE_PROVIDER_SITE_OTHER): Payer: BLUE CROSS/BLUE SHIELD | Admitting: Family Medicine

## 2015-01-23 ENCOUNTER — Encounter: Payer: Self-pay | Admitting: Family Medicine

## 2015-01-23 VITALS — BP 109/72 | HR 82 | Temp 98.9°F | Resp 16 | Ht 64.0 in | Wt 150.4 lb

## 2015-01-23 DIAGNOSIS — M5442 Lumbago with sciatica, left side: Secondary | ICD-10-CM

## 2015-01-23 DIAGNOSIS — M62838 Other muscle spasm: Secondary | ICD-10-CM

## 2015-01-23 DIAGNOSIS — K219 Gastro-esophageal reflux disease without esophagitis: Secondary | ICD-10-CM | POA: Diagnosis not present

## 2015-01-23 DIAGNOSIS — R0789 Other chest pain: Secondary | ICD-10-CM | POA: Diagnosis not present

## 2015-01-23 DIAGNOSIS — M6248 Contracture of muscle, other site: Secondary | ICD-10-CM | POA: Diagnosis not present

## 2015-01-23 MED ORDER — MELOXICAM 7.5 MG PO TABS: ORAL_TABLET | ORAL | Status: DC

## 2015-01-23 MED ORDER — PANTOPRAZOLE SODIUM 40 MG PO TBEC: 40.0000 mg | DELAYED_RELEASE_TABLET | Freq: Every day | ORAL | Status: DC

## 2015-01-23 MED ORDER — OXYCODONE-ACETAMINOPHEN 5-325 MG PO TABS: ORAL_TABLET | ORAL | Status: DC

## 2015-01-23 MED ORDER — CYCLOBENZAPRINE HCL 5 MG PO TABS: ORAL_TABLET | ORAL | Status: DC

## 2015-01-23 NOTE — Progress Notes (Signed)
Subjective:    Patient ID: Sandra Booker, female    DOB: 1971/04/25, 44 y.o.   MRN: [id number]  HPI  This 44 y.o. Female has chronic back pain due to mild scoliosis and muscle spasms. Meloxicam is effective but she is out of cyclobenzaprine. 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   GERD- this is chronic and associated w/ certain foods but pt has been having increased symptoms not related to eating. PPI effective in the past; pt needs refill. Denies n/v or difficulty swallowing. Not having epigastric pain or excessive bloating.  Pt presents today w/ c/o chest stightness; she attributes this to increased back spasms, worse at bedtime. Muscle relaxant helpful but she is out of this medication. Having spasms in neck, especially when at work on the computer. CP not accompanied by diaphoresis, radiation into arm, SOB, palpitations, n/v, GI upset, dizziness, numbness or weakness.  Pt also c/o LBP with L-sided radicular pain. She has seen a chiropractor in the past and acknowledges bouts of sciatica. Pain interrupting sleep; mattress is good quality. Pain increased with climbing steps or prolonged sitting.  Patient Active Problem List   Diagnosis Date Noted  . History of iron deficiency   . H/O cervicitis   . H/O menorrhagia   . Fibroid   . Hypoglycemia   . UTI (urinary tract infection)   . Pelvic pain in female   . ASCUS favor benign   . GERD (gastroesophageal reflux disease)   . IBS (irritable bowel syndrome)   . History of vitamin D deficiency   . History of allergic rhinitis     Prior to Admission medications   Medication Sig Start Date End Date Taking? Authorizing Provider  ALPRAZolam Duanne Moron) 0.25 MG tablet Take 1 tablet by mouth at bedtime as needed for sleep. 10/04/14  Yes Barton Fanny, MD  cyclobenzaprine (FLEXERIL) 5 MG tablet Take 1-2 tablets at bedtime for back pain and spasms.   No Barton Fanny, MD  hyoscyamine (LEVSIN, ANASPAZ)  0.125 MG tablet TAKE 1 TABLET BY MOUTH EVERY 6 HOURS AS NEEDED FOR CRAMPING 10/02/14  Yes Barton Fanny, MD  levonorgestrel [facility]) 20 MCG/24HR IUD 1 each by Intrauterine route once.   Yes Historical Provider, MD  meloxicam (MOBIC) 7.5 MG tablet TAKE 1 TABLET BY MOUTH TWICE A DAY AS NEEDED FOR PAIN. TAKE WITH FOOD. NO OTHER NSAIDS   No Barton Fanny, MD  Multiple Vitamin (MULTIVITAMIN) tablet Take 1 tablet by mouth daily.   Yes Historical Provider, MD  pantoprazole (PROTONIX) 40 MG tablet Take 1 tablet (40 mg total) by mouth daily.   Yes Barton Fanny, MD    Past Surgical History  Procedure Laterality Date  . Myomectomy    . Ankle surgery  06/2011  . Dilation and curettage of uterus  09/29/2005  . Hysteroscopy  09/29/2005  . Hernia repair      History   Social History  . Marital Status: Married    Spouse Name: Will  . Number of Children: 2  . Years of Education: 12+   Occupational History  . TITLE CLERK [employer]   Social History Main Topics  . Smoking status: Never Smoker   . Smokeless tobacco: Never Used  . Alcohol Use: No  . Drug Use: No  . Sexual Activity: Yes    Birth Control/ Protection: IUD   Other Topics Concern  . Not on file   Social History  Narrative   Lives with her husband and their 2 children, when they are not at school.    Family History  Problem Relation Age of Onset  . Breast cancer Mother   . Hypertension Mother   . Cancer Mother 60    breast  . Hypertension Father   . Hyperlipidemia Father   . Cancer Maternal Grandfather 26    Prostate    Review of Systems  As per HPI.     Objective:   Physical Exam  Constitutional: She is oriented to person, place, and time. She appears well-developed and well-nourished. No distress.  Blood pressure 109/72, pulse 82, temperature 98.9 F (37.2 C), temperature source Oral, resp. rate 16, height 5\' 4"  (1.626 m), weight 150 lb 6.4 oz (68.221 kg), SpO2 100 %.   HENT:  Head:  Normocephalic and atraumatic.  Right Ear: External ear normal.  Left Ear: External ear normal.  Nose: Nose normal.  Mouth/Throat: Oropharynx is clear and moist. No oropharyngeal exudate.  Eyes: Conjunctivae are normal. Pupils are equal, round, and reactive to light. No scleral icterus.  Neck: Normal range of motion. Neck supple.  Cardiovascular: Normal rate, regular rhythm and normal heart sounds.   No murmur heard. Pulmonary/Chest: Effort normal and breath sounds normal. No respiratory distress. She exhibits tenderness and bony tenderness. She exhibits no crepitus, no deformity and no swelling.  Chest- upper L pectoral region tender with moderate palpation.  Abdominal: Soft. Bowel sounds are normal. She exhibits no distension and no mass. There is no guarding.  Mild epig tenderness w/ moderate palpation; no HSM.  Musculoskeletal:       Cervical back: She exhibits tenderness and spasm. She exhibits no bony tenderness, no swelling, no deformity and no pain.       Thoracic back: She exhibits tenderness and spasm. She exhibits no bony tenderness, no swelling and no pain.       Lumbar back: She exhibits tenderness and spasm. She exhibits no deformity.  Mild scoliosis. LS spine- SI joint tenderness.  Neurological: She is alert and oriented to person, place, and time. She has normal strength. She displays no atrophy. No cranial nerve deficit or sensory deficit. She exhibits normal muscle tone. Coordination and gait normal.  Skin: Skin is warm and dry. She is not diaphoretic.  Psychiatric: She has a normal mood and affect. Her behavior is normal. Judgment and thought content normal.  Nursing note and vitals reviewed.   ECG: NSR; no ST-TW changes. No ectopy.     Assessment & Plan:  Feeling of chest tightness - Due to back muscle spasms. Plan: EKG 12-Lead  Gastroesophageal reflux disease, esophagitis presence not specified- Resume pantoprazole.  Muscle spasms of neck- Resume Meloxicam and  cyclobenzaprine.  Bilateral low back pain with left-sided sciatica - Plan: oxyCODONE-acetaminophen (ROXICET) 5-325 MG per tablet  Meds ordered this encounter  Medications  . cyclobenzaprine (FLEXERIL) 5 MG tablet    Sig: Take 1-2 tablets at bedtime for back pain and spasms.    Dispense:  40 tablet    Refill:  3  . pantoprazole (PROTONIX) 40 MG tablet    Sig: Take 1 tablet (40 mg total) by mouth daily.    Dispense:  30 tablet    Refill:  5  . meloxicam (MOBIC) 7.5 MG tablet    Sig: TAKE 1 TABLET BY MOUTH TWICE A DAY AS NEEDED FOR PAIN. TAKE WITH FOOD. NO OTHER NSAIDS    Dispense:  40 tablet    Refill:  5  .  oxyCODONE-acetaminophen (ROXICET) 5-325 MG per tablet    Sig: Take 1 tablet by mouth at bedtime as needed for severe back pain.    Dispense:  30 tablet    Refill:  0

## 2015-01-23 NOTE — Patient Instructions (Addendum)
Consider massage for back pain; Acupuncture may be helpful > [facility] is located on [address].  TUMERIC capsules are available over- the- counter for generalized pain.  [facility]- go to the web site and you can find all kinds of information about "home remedies" to treat any uncomplicated problems that you have.  Get Vitamin D 2000 units per capsules and take one daily.

## 2015-01-27 ENCOUNTER — Encounter: Payer: Self-pay | Admitting: Family Medicine

## 2015-04-02 ENCOUNTER — Other Ambulatory Visit: Payer: Self-pay | Admitting: Family Medicine

## 2015-07-04 ENCOUNTER — Other Ambulatory Visit: Payer: Self-pay | Admitting: Family Medicine

## 2015-07-04 NOTE — Telephone Encounter (Signed)
Done

## 2015-07-07 NOTE — Telephone Encounter (Signed)
Faxed

## 2015-09-18 ENCOUNTER — Other Ambulatory Visit: Payer: Self-pay

## 2015-09-18 MED ORDER — CYCLOBENZAPRINE HCL 5 MG PO TABS: ORAL_TABLET | ORAL | Status: DC

## 2015-12-23 ENCOUNTER — Encounter: Payer: Self-pay | Admitting: Family Medicine

## 2015-12-23 ENCOUNTER — Ambulatory Visit (INDEPENDENT_AMBULATORY_CARE_PROVIDER_SITE_OTHER): Payer: BLUE CROSS/BLUE SHIELD | Admitting: Family Medicine

## 2015-12-23 VITALS — BP 100/67 | HR 76 | Temp 98.1°F | Resp 16 | Ht 64.25 in | Wt 144.6 lb

## 2015-12-23 DIAGNOSIS — M5441 Lumbago with sciatica, right side: Secondary | ICD-10-CM

## 2015-12-23 DIAGNOSIS — M5442 Lumbago with sciatica, left side: Secondary | ICD-10-CM | POA: Diagnosis not present

## 2015-12-23 DIAGNOSIS — R42 Dizziness and giddiness: Secondary | ICD-10-CM | POA: Diagnosis not present

## 2015-12-23 DIAGNOSIS — J309 Allergic rhinitis, unspecified: Secondary | ICD-10-CM

## 2015-12-23 DIAGNOSIS — Z8639 Personal history of other endocrine, nutritional and metabolic disease: Secondary | ICD-10-CM

## 2015-12-23 LAB — CBC
HEMATOCRIT: 37.8 % (ref 36.0–46.0)
HEMOGLOBIN: 12.8 g/dL (ref 12.0–15.0)
MCH: 30.2 pg (ref 26.0–34.0)
MCHC: 33.9 g/dL (ref 30.0–36.0)
MCV: 89.2 fL (ref 78.0–100.0)
MPV: 10.2 fL (ref 8.6–12.4)
Platelets: 256 10*3/uL (ref 150–400)
RBC: 4.24 MIL/uL (ref 3.87–5.11)
RDW: 12.8 % (ref 11.5–15.5)
WBC: 7 10*3/uL (ref 4.0–10.5)

## 2015-12-23 LAB — COMPLETE METABOLIC PANEL WITH GFR
ALBUMIN: 4.5 g/dL (ref 3.6–5.1)
ALK PHOS: 68 U/L (ref 33–115)
ALT: 9 U/L (ref 6–29)
AST: 14 U/L (ref 10–35)
BILIRUBIN TOTAL: 0.4 mg/dL (ref 0.2–1.2)
BUN: 9 mg/dL (ref 7–25)
CO2: 23 mmol/L (ref 20–31)
Calcium: 9.5 mg/dL (ref 8.6–10.2)
Chloride: 101 mmol/L (ref 98–110)
Creat: 0.62 mg/dL (ref 0.50–1.10)
GFR, Est Non African American: >89 mL/min (ref >=60)
Glucose, Bld: 85 mg/dL (ref 65–99)
Potassium: 4 mmol/L (ref 3.5–5.3)
Sodium: 137 mmol/L (ref 135–146)
TOTAL PROTEIN: 7.1 g/dL (ref 6.1–8.1)

## 2015-12-23 LAB — LIPID PANEL
CHOL/HDL RATIO: 3.6 ratio (ref <=5.0)
CHOLESTEROL: 164 mg/dL (ref 125–200)
HDL: 45 mg/dL — ABNORMAL LOW (ref >=46)
LDL Cholesterol: 103 mg/dL (ref <130)
Triglycerides: 78 mg/dL (ref <150)
VLDL: 16 mg/dL (ref <30)

## 2015-12-23 LAB — VITAMIN B12: Vitamin B-12: 907 pg/mL (ref 211–911)

## 2015-12-23 MED ORDER — CYCLOBENZAPRINE HCL 5 MG PO TABS: ORAL_TABLET | ORAL | Status: DC

## 2015-12-23 NOTE — Patient Instructions (Addendum)
For acid reflux you can try over the counter Pepcid or Zantac, can take up to twice a day as needed (generic versions are fine).   Please take a daily antihistamine for at least 2 weeks- Zyrtec, Allegra or Claritin- generic is fine For nasal congestion, try over the counter sudafed and afrin nasal spray (use afrin twice a day for a maximum of 3 days).   Try some yoga for your back- a website that I like [email or web address]

## 2015-12-23 NOTE — Progress Notes (Signed)
Subjective:    Patient ID: Sandra Booker, female    DOB: [birth year], 45 y.o.   MRN: [id number]  HPI This is a pleasant 45 yo female who presents today with 2 week history of headache, nasal drainage (clear), occasional cough, some ear pressure, post nasal drainage, sore throat. Has taken otc sinus medication, benadryl with minimal relief.   She sees gyn annually for well woman care. Has a history of low back and neck pain. Goes to chiropractor 2x/month. Requests flexeril refill. Has been watching diet and exercising more. Takes Protonix for occasional GERD. Has not been taking vitamin D supplementation.   Past Medical History  Diagnosis Date  . GERD (gastroesophageal reflux disease)   . IBS (irritable bowel syndrome)   . History of vitamin D deficiency   . History of allergic rhinitis   . History of iron deficiency   . Encounter for insertion of mirena IUD 09/27/2010  . H/O cervicitis   . H/O menorrhagia   . Fibroid   . Hypoglycemia   . UTI (urinary tract infection)     h/o  . Pelvic pain in female     h/o  . Cyst of breast     right breast  . Abnormal Pap smear   . Allergy   . Anemia   . Anxiety   . Arthritis    Past Surgical History  Procedure Laterality Date  . Myomectomy    . Ankle surgery  06/2011  . Dilation and curettage of uterus  09/29/2005  . Hysteroscopy  09/29/2005  . Hernia repair     Family History  Problem Relation Age of Onset  . Breast cancer Mother   . Hypertension Mother   . Cancer Mother 15    breast  . Hypertension Father   . Hyperlipidemia Father   . Cancer Maternal Grandfather 69    Prostate   Social History  Substance Use Topics  . Smoking status: Never Smoker   . Smokeless tobacco: Never Used  . Alcohol Use: No    Review of Systems  Constitutional: Negative for fever and chills.  HENT: Positive for ear pain (pressure), postnasal drip, rhinorrhea, sinus pressure and sneezing.   Respiratory: Negative for cough, chest tightness,  shortness of breath and wheezing.   Cardiovascular: Negative for chest pain and leg swelling.  Musculoskeletal: Positive for back pain and neck pain.  Neurological: Positive for light-headedness and headaches. Negative for dizziness.      Objective:   Physical Exam  Constitutional: She is oriented to person, place, and time. She appears well-developed and well-nourished. No distress.  HENT:  Head: Normocephalic and atraumatic.  Right Ear: External ear and ear canal normal. Tympanic membrane is retracted.  Left Ear: Tympanic membrane, external ear and ear canal normal.  Nose: Mucosal edema and rhinorrhea present.  Mouth/Throat: Uvula is midline, oropharynx is clear and moist and mucous membranes are normal.  Cardiovascular: Normal rate, regular rhythm and normal heart sounds.   Pulmonary/Chest: Effort normal and breath sounds normal.  Musculoskeletal: Normal range of motion. She exhibits no edema or tenderness.  Neurological: She is alert and oriented to person, place, and time.  Skin: Skin is warm and dry. She is not diaphoretic.  Psychiatric: She has a normal mood and affect. Her behavior is normal. Judgment and thought content normal.  Vitals reviewed.  BP 100/67 mmHg  Pulse 76  Temp(Src) 98.1 F (36.7 C) (Oral)  Resp 16  Ht 5' 4.25" (1.632 m)  Wt 144  lb 9.6 oz (65.59 kg)  BMI 24.63 kg/m2  SpO2 100% Wt Readings from Last 3 Encounters:  12/23/15 144 lb 9.6 oz (65.59 kg)  01/23/15 150 lb 6.4 oz (68.221 kg)  06/13/14 152 lb (68.947 kg)      Assessment & Plan:  1. Episodic lightheadedness - suspect this is related to #2 - CBC - Vitamin B12 - Lipid panel - COMPLETE METABOLIC PANEL WITH GFR  2. Allergic rhinitis, unspecified allergic rhinitis type - patient instructed to use daily long acting antihistamine for 2 weeks (she declined Flonase), OTC decongestant and Afrin nasal spray.  3. History of vitamin D deficiency - VITAMIN D 25 Hydroxy (Vit-D Deficiency,  Fractures)  4. Bilateral low back pain with sciatica, sciatica laterality unspecified - she is satisfied with current chiropractor treatments - encouraged her to add more core strength and stretching - cyclobenzaprine (FLEXERIL) 5 MG tablet; Take 1-2 tablets at bedtime for back pain and spasms.  Dispense: 40 tablet; Refill: 0  5. GERD - try OTC H2 blocker PRN instead of Protonix - avoid foods that trigger symptoms  Clarene Reamer, FNP-BC  [facility]  12/25/2015 8:26 AM

## 2015-12-24 ENCOUNTER — Other Ambulatory Visit: Payer: Self-pay

## 2015-12-24 LAB — VITAMIN D 25 HYDROXY (VIT D DEFICIENCY, FRACTURES): Vit D, 25-Hydroxy: 26 ng/mL — ABNORMAL LOW (ref 30–100)

## 2015-12-24 NOTE — Telephone Encounter (Signed)
Sandra Booker, I got a req to RF meloxicam from pharm. You just saw pt and haven't gotten notes completed yet, but see that meloxicam is still listed on med list on AVS after appt. I wanted to make sure that you want pt to continue this, and also to check dosage to see how you want to Rx. Dr Leward Quan Rxd it for BID prn, but since it is often only Rxd once a day, I wanted to check before Sandra Booker it. It is better to send in 90 day supplies if possible.

## 2015-12-29 ENCOUNTER — Other Ambulatory Visit: Payer: Self-pay | Admitting: Family Medicine

## 2015-12-29 MED ORDER — MELOXICAM 7.5 MG PO TABS: 7.5000 mg | ORAL_TABLET | Freq: Every day | ORAL | Status: DC

## 2015-12-29 NOTE — Telephone Encounter (Signed)
I sent in a prescription to her pharmacy for once a day as needed. She should take as sparingly as possible, no more than a couple of times per week.

## 2016-01-19 ENCOUNTER — Other Ambulatory Visit: Payer: Self-pay

## 2016-01-19 MED ORDER — HYOSCYAMINE SULFATE 0.125 MG PO TABS: ORAL_TABLET | ORAL | Status: DC

## 2016-01-19 NOTE — Telephone Encounter (Signed)
Sandra Booker, pharm reqs RFs of hyoscyamine. It was on pt's med list when you saw her this month, but don't see it discussed. OK to RF?

## 2016-08-25 DIAGNOSIS — M7671 Peroneal tendinitis, right leg: Secondary | ICD-10-CM | POA: Diagnosis not present

## 2016-08-25 DIAGNOSIS — Z9889 Other specified postprocedural states: Secondary | ICD-10-CM | POA: Diagnosis not present

## 2016-08-25 DIAGNOSIS — M25371 Other instability, right ankle: Secondary | ICD-10-CM | POA: Diagnosis not present

## 2016-09-02 ENCOUNTER — Other Ambulatory Visit: Payer: Self-pay

## 2016-09-02 NOTE — Telephone Encounter (Signed)
Fax request for Meloxicam - Attention DebGessner Last refill 12/24/2015

## 2016-09-14 NOTE — Telephone Encounter (Signed)
Needs oV before more refills

## 2016-09-16 NOTE — Telephone Encounter (Signed)
LMOM for pt that we sent in the RF but needs OV to est care w/new PCP for more.

## 2016-09-21 ENCOUNTER — Encounter: Payer: Self-pay | Admitting: Family Medicine

## 2016-09-22 ENCOUNTER — Other Ambulatory Visit: Payer: Self-pay | Admitting: Obstetrics and Gynecology

## 2016-09-22 DIAGNOSIS — Z1231 Encounter for screening mammogram for malignant neoplasm of breast: Secondary | ICD-10-CM

## 2016-09-22 DIAGNOSIS — Z803 Family history of malignant neoplasm of breast: Secondary | ICD-10-CM

## 2016-09-29 DIAGNOSIS — G5781 Other specified mononeuropathies of right lower limb: Secondary | ICD-10-CM | POA: Diagnosis not present

## 2016-09-29 DIAGNOSIS — Z9889 Other specified postprocedural states: Secondary | ICD-10-CM | POA: Diagnosis not present

## 2016-09-29 DIAGNOSIS — M25371 Other instability, right ankle: Secondary | ICD-10-CM | POA: Diagnosis not present

## 2016-09-29 DIAGNOSIS — M7671 Peroneal tendinitis, right leg: Secondary | ICD-10-CM | POA: Diagnosis not present

## 2016-09-30 ENCOUNTER — Ambulatory Visit (INDEPENDENT_AMBULATORY_CARE_PROVIDER_SITE_OTHER): Payer: BLUE CROSS/BLUE SHIELD | Admitting: Urgent Care

## 2016-09-30 VITALS — BP 122/76 | HR 88 | Temp 98.8°F | Resp 16 | Ht 64.5 in | Wt 151.0 lb

## 2016-09-30 DIAGNOSIS — F5105 Insomnia due to other mental disorder: Secondary | ICD-10-CM

## 2016-09-30 DIAGNOSIS — D259 Leiomyoma of uterus, unspecified: Secondary | ICD-10-CM | POA: Diagnosis not present

## 2016-09-30 DIAGNOSIS — F409 Phobic anxiety disorder, unspecified: Secondary | ICD-10-CM | POA: Diagnosis not present

## 2016-09-30 DIAGNOSIS — K589 Irritable bowel syndrome without diarrhea: Secondary | ICD-10-CM | POA: Diagnosis not present

## 2016-09-30 DIAGNOSIS — Z8639 Personal history of other endocrine, nutritional and metabolic disease: Secondary | ICD-10-CM | POA: Diagnosis not present

## 2016-09-30 DIAGNOSIS — M479 Spondylosis, unspecified: Secondary | ICD-10-CM

## 2016-09-30 LAB — CBC
HEMATOCRIT: 42.5 % (ref 35.0–45.0)
HEMOGLOBIN: 14.1 g/dL (ref 11.7–15.5)
MCH: 29.9 pg (ref 27.0–33.0)
MCHC: 33.2 g/dL (ref 32.0–36.0)
MCV: 90 fL (ref 80.0–100.0)
MPV: 10.9 fL (ref 7.5–12.5)
Platelets: 267 10*3/uL (ref 140–400)
RBC: 4.72 MIL/uL (ref 3.80–5.10)
RDW: 13.1 % (ref 11.0–15.0)
WBC: 8.3 10*3/uL (ref 3.8–10.8)

## 2016-09-30 MED ORDER — ALPRAZOLAM 0.25 MG PO TABS: 0.2500 mg | ORAL_TABLET | Freq: Every evening | ORAL | 0 refills | Status: DC | PRN

## 2016-09-30 MED ORDER — CYCLOBENZAPRINE HCL 5 MG PO TABS: ORAL_TABLET | ORAL | 5 refills | Status: DC

## 2016-09-30 MED ORDER — MELOXICAM 7.5 MG PO TABS: 7.5000 mg | ORAL_TABLET | Freq: Every day | ORAL | 2 refills | Status: DC

## 2016-09-30 MED ORDER — HYOSCYAMINE SULFATE 0.125 MG PO TABS: ORAL_TABLET | ORAL | 5 refills | Status: DC

## 2016-09-30 NOTE — Patient Instructions (Signed)
     IF you received an x-ray today, you will receive an invoice from [facility]. Please contact [facility] at [phone] with questions or concerns regarding your invoice.   IF you received labwork today, you will receive an invoice from [facility]. Please contact [facility] at [phone] with questions or concerns regarding your invoice.   Our billing staff will not be able to assist you with questions regarding bills from these companies.  You will be contacted with the lab results as soon as they are available. The fastest way to get your results is to activate your My Chart account. Instructions are located on the last page of this paperwork. If you have not heard from us regarding the results in 2 weeks, please contact this office.      

## 2016-09-30 NOTE — Progress Notes (Signed)
    MRN: [id number] DOB: 11/17/1971  Subjective:   Sandra Booker is a 45 y.o. female presenting for chief complaint of Medication Refill (Mobic, xanax, flexeril, hyscacomin)  Arthritis - Reports history of back arthritis. Uses Mobic, Flexeril. Uses these medications as needed, sees a chiropractor as well. Mobic can cause some stomach upset due to her IBS so she limits her use of this medication. However, it does help very well with flare ups of her back arthritis.  Uterine fibroids - Managed with hyoscyamine for management of IBS, uterine fibroids. Does very well with this. Would like a refill.   Anxiety - Managed with Xanax. Has a history of PTSD with her husband, he used to have seizures and she has difficulty from time to time sleeping. Xanax helps with this. Uses this very sparingly, 1-2 times per month.   Sandra Booker has a current medication list which includes the following prescription(s): alprazolam, cyclobenzaprine, hyoscyamine, levonorgestrel, meloxicam, multivitamin, and pantoprazole. Also has No Known Allergies.  Sandra Booker  has a past medical history of Abnormal Pap smear; Allergy; Anemia; Anxiety; Arthritis; Cyst of breast; Encounter for insertion of mirena IUD (09/27/2010); Fibroid; GERD (gastroesophageal reflux disease); H/O cervicitis; H/O menorrhagia; History of allergic rhinitis; History of iron deficiency; History of vitamin D deficiency; Hypoglycemia; IBS (irritable bowel syndrome); Pelvic pain in female; and UTI (urinary tract infection). Also  has a past surgical history that includes Myomectomy; Ankle surgery (06/2011); Dilation and curettage of uterus (09/29/2005); Hysteroscopy (09/29/2005); and Hernia repair.   Objective:   Vitals: BP 122/76   Pulse 88   Temp 98.8 F (37.1 C) (Oral)   Resp 16   Ht 5' 4.5" (1.638 m)   Wt 151 lb (68.5 kg)   SpO2 98%   BMI 25.52 kg/m   Physical Exam  Constitutional: She is oriented to person, place, and time. She appears well-developed  and well-nourished.  HENT:  Mouth/Throat: Oropharynx is clear and moist.  Eyes: No scleral icterus.  Neck: Normal range of motion. Neck supple. No thyromegaly present.  Cardiovascular: Normal rate, regular rhythm and intact distal pulses.  Exam reveals no gallop and no friction rub.   No murmur heard. Pulmonary/Chest: No respiratory distress. She has no wheezes. She has no rales.  Abdominal: Soft. Bowel sounds are normal. She exhibits no distension and no mass. There is no tenderness.  Neurological: She is alert and oriented to person, place, and time.  Skin: Skin is warm and dry.   Assessment and Plan :   1. Arthritis of back - Stable. Refilled meloxicam, labs pending.   2. Irritable bowel syndrome, unspecified type 3. Uterine leiomyoma, unspecified location - Stable, refilled hyoscyamine.  4. Insomnia due to anxiety and fear - Refilled Xanax. Counseled on use of this medication including side effects, long term effects. - COMPLETE METABOLIC PANEL WITH GFR - Thyroid Panel With TSH  5. History of iron deficiency - CBC  6. History of vitamin D deficiency - VITAMIN D 25 Hydroxy (Vit-D Deficiency, Fractures)   Jaynee Eagles, PA-C [facility] [phone] 09/30/2016 12:19 PM

## 2016-10-01 LAB — COMPLETE METABOLIC PANEL WITH GFR
ALT: 10 U/L (ref 6–29)
AST: 18 U/L (ref 10–35)
Albumin: 4.7 g/dL (ref 3.6–5.1)
Alkaline Phosphatase: 78 U/L (ref 33–115)
BUN: 9 mg/dL (ref 7–25)
CALCIUM: 10 mg/dL (ref 8.6–10.2)
CHLORIDE: 100 mmol/L (ref 98–110)
CO2: 22 mmol/L (ref 20–31)
Creat: 0.64 mg/dL (ref 0.50–1.10)
GFR, Est African American: >89 mL/min (ref >=60)
GFR, Est Non African American: >89 mL/min (ref >=60)
GLUCOSE: 78 mg/dL (ref 65–99)
POTASSIUM: 4.2 mmol/L (ref 3.5–5.3)
SODIUM: 140 mmol/L (ref 135–146)
Total Bilirubin: 0.5 mg/dL (ref 0.2–1.2)
Total Protein: 7.8 g/dL (ref 6.1–8.1)

## 2016-10-01 LAB — VITAMIN D 25 HYDROXY (VIT D DEFICIENCY, FRACTURES): VIT D 25 HYDROXY: 15 ng/mL — AB (ref 30–100)

## 2016-10-01 LAB — THYROID PANEL WITH TSH
Free Thyroxine Index: 1.7 (ref 1.4–3.8)
T3 Uptake: 29 % (ref 22–35)
T4 TOTAL: 6 ug/dL (ref 4.5–12.0)
TSH: 0.58 m[IU]/L

## 2016-10-04 ENCOUNTER — Other Ambulatory Visit: Payer: Self-pay | Admitting: Urgent Care

## 2016-10-04 DIAGNOSIS — E559 Vitamin D deficiency, unspecified: Secondary | ICD-10-CM

## 2016-10-04 MED ORDER — VITAMIN D (ERGOCALCIFEROL) 1.25 MG (50000 UNIT) PO CAPS: 50000.0000 [IU] | ORAL_CAPSULE | ORAL | 3 refills | Status: DC

## 2016-10-24 ENCOUNTER — Ambulatory Visit
Admission: RE | Admit: 2016-10-24 | Discharge: 2016-10-24 | Disposition: A | Discharge status: Home / Self Care | Payer: BLUE CROSS/BLUE SHIELD | Source: Ambulatory Visit | Attending: Obstetrics and Gynecology | Admitting: Obstetrics and Gynecology

## 2016-10-24 DIAGNOSIS — Z1231 Encounter for screening mammogram for malignant neoplasm of breast: Secondary | ICD-10-CM | POA: Diagnosis not present

## 2016-10-24 DIAGNOSIS — Z803 Family history of malignant neoplasm of breast: Secondary | ICD-10-CM

## 2016-11-06 ENCOUNTER — Encounter: Payer: Self-pay | Admitting: Urgent Care

## 2016-11-08 ENCOUNTER — Other Ambulatory Visit: Payer: Self-pay | Admitting: Urgent Care

## 2016-11-08 MED ORDER — PANTOPRAZOLE SODIUM 40 MG PO TBEC: 40.0000 mg | DELAYED_RELEASE_TABLET | Freq: Every day | ORAL | 5 refills | Status: DC

## 2016-11-16 DIAGNOSIS — D259 Leiomyoma of uterus, unspecified: Secondary | ICD-10-CM | POA: Diagnosis not present

## 2016-11-16 DIAGNOSIS — Z01411 Encounter for gynecological examination (general) (routine) with abnormal findings: Secondary | ICD-10-CM | POA: Diagnosis not present

## 2016-11-16 DIAGNOSIS — N920 Excessive and frequent menstruation with regular cycle: Secondary | ICD-10-CM | POA: Diagnosis not present

## 2016-11-16 DIAGNOSIS — Z124 Encounter for screening for malignant neoplasm of cervix: Secondary | ICD-10-CM | POA: Diagnosis not present

## 2016-11-16 DIAGNOSIS — N6310 Unspecified lump in the right breast, unspecified quadrant: Secondary | ICD-10-CM | POA: Diagnosis not present

## 2016-11-17 ENCOUNTER — Other Ambulatory Visit: Payer: Self-pay | Admitting: Obstetrics and Gynecology

## 2016-11-17 DIAGNOSIS — N63 Unspecified lump in unspecified breast: Secondary | ICD-10-CM

## 2016-11-24 ENCOUNTER — Ambulatory Visit
Admission: RE | Admit: 2016-11-24 | Discharge: 2016-11-24 | Disposition: A | Discharge status: Home / Self Care | Payer: BLUE CROSS/BLUE SHIELD | Source: Ambulatory Visit | Attending: Obstetrics and Gynecology | Admitting: Obstetrics and Gynecology

## 2016-11-24 DIAGNOSIS — N63 Unspecified lump in unspecified breast: Secondary | ICD-10-CM

## 2016-11-24 DIAGNOSIS — N6489 Other specified disorders of breast: Secondary | ICD-10-CM | POA: Diagnosis not present

## 2016-11-24 DIAGNOSIS — R922 Inconclusive mammogram: Secondary | ICD-10-CM | POA: Diagnosis not present

## 2017-01-05 DIAGNOSIS — M722 Plantar fascial fibromatosis: Secondary | ICD-10-CM | POA: Diagnosis not present

## 2017-02-22 ENCOUNTER — Encounter: Payer: Self-pay | Admitting: Urgent Care

## 2017-02-23 ENCOUNTER — Telehealth: Payer: Self-pay

## 2017-02-23 NOTE — Telephone Encounter (Signed)
MY CHART request to make Dr. Delia Chimes her PCP.

## 2017-08-30 ENCOUNTER — Telehealth: Payer: Self-pay

## 2017-08-30 NOTE — Telephone Encounter (Signed)
LMVM on patient's phone requesting call back to review overdue health maintenance.

## 2017-09-13 ENCOUNTER — Ambulatory Visit (INDEPENDENT_AMBULATORY_CARE_PROVIDER_SITE_OTHER): Payer: BLUE CROSS/BLUE SHIELD | Admitting: Family Medicine

## 2017-09-13 ENCOUNTER — Encounter: Payer: Self-pay | Admitting: Family Medicine

## 2017-09-13 VITALS — BP 122/86 | HR 54 | Temp 99.1°F | Resp 17 | Ht 64.5 in | Wt 157.6 lb

## 2017-09-13 DIAGNOSIS — R0789 Other chest pain: Secondary | ICD-10-CM

## 2017-09-13 DIAGNOSIS — F411 Generalized anxiety disorder: Secondary | ICD-10-CM | POA: Diagnosis not present

## 2017-09-13 MED ORDER — MELOXICAM 7.5 MG PO TABS: 7.5000 mg | ORAL_TABLET | Freq: Every day | ORAL | 2 refills | Status: DC

## 2017-09-13 MED ORDER — ALPRAZOLAM 0.25 MG PO TABS
0.2500 mg | ORAL_TABLET | Freq: Every evening | ORAL | 0 refills | Status: DC | PRN
Start: 2017-09-13 — End: 2018-08-07

## 2017-09-13 NOTE — Patient Instructions (Addendum)
IF you received an x-ray today, you will receive an invoice from [facility]. Please contact [facility] at [phone] with questions or concerns regarding your invoice.   IF you received labwork today, you will receive an invoice from [facility]. Please contact [facility] at [phone] with questions or concerns regarding your invoice.   Our billing staff will not be able to assist you with questions regarding bills from these companies.  You will be contacted with the lab results as soon as they are available. The fastest way to get your results is to activate your My Chart account. Instructions are located on the last page of this paperwork. If you have not heard from [name] regarding the results in 2 weeks, please contact this office.     Costochondritis Costochondritis is swelling and irritation (inflammation) of the tissue (cartilage) that connects your ribs to your breastbone (sternum). This causes pain in the front of your chest. The pain usually starts gradually and involves more than one rib. What are the causes? The exact cause of this condition is not always known. It results from stress on the cartilage where your ribs attach to your sternum. The cause of this stress could be:  Chest injury (trauma).  Exercise or activity, such as lifting.  Severe coughing.  What increases the risk? You may be at higher risk for this condition if you:  Are female.  Are 91?48 years old.  Recently started a new exercise or work activity.  Have low levels of vitamin D.  Have a condition that makes you cough frequently.  What are the signs or symptoms? The main symptom of this condition is chest pain. The pain:  Usually starts gradually and can be sharp or dull.  Gets worse with deep breathing, coughing, or exercise.  Gets better with rest.  May be worse when you press on the sternum-rib connection (tenderness).  How is this diagnosed? This condition is  diagnosed based on your symptoms, medical history, and a physical exam. Your health care provider will check for tenderness when pressing on your sternum. This is the most important finding. You may also have tests to rule out other causes of chest pain. These may include:  A chest X-ray to check for lung problems.  An electrocardiogram (ECG) to see if you have a heart problem that could be causing the pain.  An imaging scan to rule out a chest or rib fracture.  How is this treated? This condition usually goes away on its own over time. Your health care provider may prescribe an NSAID to reduce pain and inflammation. Your health care provider may also suggest that you:  Rest and avoid activities that make pain worse.  Apply heat or cold to the area to reduce pain and inflammation.  Do exercises to stretch your chest muscles.  If these treatments do not help, your health care provider may inject a numbing medicine at the sternum-rib connection to help relieve the pain. Follow these instructions at home:  Avoid activities that make pain worse. This includes any activities that use chest, abdominal, and side muscles.  If directed, put ice on the painful area: ? Put ice in a plastic bag. ? Place a towel between your skin and the bag. ? Leave the ice on for 20 minutes, 2-3 times a day.  If directed, apply heat to the affected area as often as told by your health care provider. Use the heat source that your health care provider  recommends, such as a moist heat pack or a heating pad. ? Place a towel between your skin and the heat source. ? Leave the heat on for 20-30 minutes. ? Remove the heat if your skin turns bright red. This is especially important if you are unable to feel pain, heat, or cold. You may have a greater risk of getting burned.  Take over-the-counter and prescription medicines only as told by your health care provider.  Return to your normal activities as told by your  health care provider. Ask your health care provider what activities are safe for you.  Keep all follow-up visits as told by your health care provider. This is important. Contact a health care provider if:  You have chills or a fever.  Your pain does not go away or it gets worse.  You have a cough that does not go away (is persistent). Get help right away if:  You have shortness of breath. This information is not intended to replace advice given to you by your health care provider. Make sure you discuss any questions you have with your health care provider. Document Released: 08/17/2005 Document Revised: 05/27/2016 Document Reviewed: 03/02/2016 Elsevier Interactive Patient Education  [employer].

## 2017-09-13 NOTE — Progress Notes (Signed)
Chief Complaint  Patient presents with  . right hand middle finger pain    since 2nd week in september, hurts to wash hair or ball hand into fist, per pt she jammed it in a drawer and surprised it is still hurting  . Chest Pain    onset: 09/10/17 woke up with chest tightness, ? pulled muscle as pt says she was moving furniture over the weekend or it could just be stress pt's father is terminally itt and she is worried about him.  Pt received rx last year for xanax but never filled it and now needs new rx for it and flexeril.    HPI    Pt reports that she gets a chest pain that last a few seconds and she can get another one in 15 minutes She says it feels like someone has been squeezing her left chest She says its constant since Sunday She moved furniture on Friday She also reports that she has stress related to her father who is terminally ill Aggravated by- nothing Alleviated by- nothing If she reaches for things it might happen  It is non-radiating  She denies nausea or sweats No arm weakness No pain in the shoulder blades  She also is here because 6 weeks ago she jammed her middle finger was jammed in a drawer Now she cannot make a tight fist  4 review of systems  Past Medical History:  Diagnosis Date  . Abnormal Pap smear   . Allergy   . Anemia   . Anxiety   . Arthritis   . Cyst of breast    right breast  . Encounter for insertion of mirena IUD 09/27/2010  . Fibroid   . GERD (gastroesophageal reflux disease)   . H/O cervicitis   . H/O menorrhagia   . History of allergic rhinitis   . History of iron deficiency   . History of vitamin D deficiency   . Hypoglycemia   . IBS (irritable bowel syndrome)   . Pelvic pain in female    h/o  . UTI (urinary tract infection)    h/o    Current Outpatient Medications  Medication Sig Dispense Refill  . ALPRAZolam (XANAX) 0.25 MG tablet Take 1 tablet (0.25 mg total) by mouth at bedtime as needed for anxiety. 30 tablet 0  .  levonorgestrel (MIRENA) 20 MCG/24HR IUD 1 each by Intrauterine route once.    . Multiple Vitamin (MULTIVITAMIN) tablet Take 1 tablet by mouth daily.    . pantoprazole (PROTONIX) 40 MG tablet Take 1 tablet (40 mg total) by mouth daily. 30 tablet 5  . cyclobenzaprine (FLEXERIL) 5 MG tablet Take 1-2 tablets at bedtime for back pain and spasms. 60 tablet 5  . gabapentin (NEURONTIN) 600 MG tablet Take 1 tablet (600 mg total) by mouth 2 (two) times daily. 60 tablet 3  . hyoscyamine (LEVSIN, ANASPAZ) 0.125 MG tablet TAKE 1 TABLET BY MOUTH EVERY 6 HOURS AS NEEDED FOR CRAMPING 30 tablet 5  . meloxicam (MOBIC) 7.5 MG tablet Take 1 tablet (7.5 mg total) by mouth daily. TAKE 1 TABLET BY MOUTH DAILY AS NEEDED FOR PAIN. TAKE WITH FOOD. NO OTHER NSAIDS 30 tablet 2  . Vitamin D, Ergocalciferol, (DRISDOL) 50000 units CAPS capsule Take 1 capsule (50,000 Units total) by mouth every 7 (seven) days. 12 capsule 3   No current facility-administered medications for this visit.     Allergies:  Allergies  Allergen Reactions  . Other     Past Surgical History:  Procedure Laterality Date  . ANKLE SURGERY  06/2011  . DILATION AND CURETTAGE OF UTERUS  09/29/2005  . HERNIA REPAIR    . HYSTEROSCOPY  09/29/2005  . MYOMECTOMY      Social History   Socioeconomic History  . Marital status: Married    Spouse name: Will  . Number of children: 2  . Years of education: 12+  . Highest education level: Not on file  Occupational History  . Occupation: TITLE Armed forces operational officer: [employer]  Social Needs  . Financial resource strain: Not on file  . Food insecurity:    Worry: Not on file    Inability: Not on file  . Transportation needs:    Medical: Not on file    Non-medical: Not on file  Tobacco Use  . Smoking status: Never Smoker  . Smokeless tobacco: Never Used  Substance and Sexual Activity  . Alcohol use: No  . Drug use: No  . Sexual activity: Yes    Birth control/protection: IUD  Lifestyle  .  Physical activity:    Days per week: Not on file    Minutes per session: Not on file  . Stress: Not on file  Relationships  . Social connections:    Talks on phone: Not on file    Gets together: Not on file    Attends religious service: Not on file    Active member of club or organization: Not on file    Attends meetings of clubs or organizations: Not on file    Relationship status: Not on file  Other Topics Concern  . Not on file  Social History Narrative   Lives with her husband and their 2 children, when they are not at school.    Family History  Problem Relation Age of Onset  . Breast cancer Mother   . Hypertension Mother   . Cancer Mother 55       breast  . Hypertension Father   . Hyperlipidemia Father   . Cancer Maternal Grandfather 87       Prostate     ROS Review of Systems See HPI Constitution: No fevers or chills No malaise No diaphoresis Skin: No rash or itching Eyes: no blurry vision, no double vision GU: no dysuria or hematuria Neuro: no dizziness or headaches * all others reviewed and negative   Objective: Vitals:   09/13/17 1648  BP: 122/86  Pulse: (!) 54  Resp: 17  Temp: 99.1 F (37.3 C)  TempSrc: Oral  SpO2: 95%  Weight: 157 lb 9.6 oz (71.5 kg)  Height: 5' 4.5" (1.638 m)    Physical Exam Physical Exam  Constitutional: She is oriented to person, place, and time. She appears well-developed and well-nourished.  HENT:  Head: Normocephalic and atraumatic.  Eyes: Conjunctivae and EOM are normal.  Cardiovascular: Normal rate, regular rhythm and normal heart sounds.   Pulmonary/Chest: Effort normal and breath sounds normal. No respiratory distress. She has no wheezes.  Abdominal: Normal appearance and bowel sounds are normal. There is no tenderness. There is no CVA tenderness.  Neurological: She is alert and oriented to person, place, and time.    ecg- nsr, no st changes   Assessment and Plan Evetta was seen today for right hand  middle finger pain and chest pain.  Diagnoses and all orders for this visit:  Other chest pain- advised nsaid Gave er precautions -     EKG 12-Lead  Anxiety state- refilled low dose xanax  Other orders -     meloxicam (MOBIC) 7.5 MG tablet; Take 1 tablet (7.5 mg total) by mouth daily. TAKE 1 TABLET BY MOUTH DAILY AS NEEDED FOR PAIN. TAKE WITH FOOD. NO OTHER NSAIDS -     ALPRAZolam (XANAX) 0.25 MG tablet; Take 1 tablet (0.25 mg total) by mouth at bedtime as needed for anxiety.     [place]

## 2017-09-14 ENCOUNTER — Encounter: Payer: Self-pay | Admitting: Family Medicine

## 2017-09-27 ENCOUNTER — Other Ambulatory Visit: Payer: Self-pay | Admitting: Obstetrics and Gynecology

## 2017-09-27 DIAGNOSIS — Z139 Encounter for screening, unspecified: Secondary | ICD-10-CM

## 2017-10-26 ENCOUNTER — Ambulatory Visit
Admission: RE | Admit: 2017-10-26 | Discharge: 2017-10-26 | Disposition: A | Discharge status: Home / Self Care | Payer: BLUE CROSS/BLUE SHIELD | Source: Ambulatory Visit | Attending: Obstetrics and Gynecology | Admitting: Obstetrics and Gynecology

## 2017-10-26 DIAGNOSIS — Z139 Encounter for screening, unspecified: Secondary | ICD-10-CM

## 2017-10-26 DIAGNOSIS — Z1231 Encounter for screening mammogram for malignant neoplasm of breast: Secondary | ICD-10-CM | POA: Diagnosis not present

## 2017-11-11 ENCOUNTER — Other Ambulatory Visit: Payer: Self-pay | Admitting: Urgent Care

## 2017-11-23 ENCOUNTER — Encounter: Payer: Self-pay | Admitting: Family Medicine

## 2017-11-24 ENCOUNTER — Encounter: Payer: Self-pay | Admitting: Family Medicine

## 2017-11-30 DIAGNOSIS — D259 Leiomyoma of uterus, unspecified: Secondary | ICD-10-CM | POA: Diagnosis not present

## 2017-11-30 DIAGNOSIS — N920 Excessive and frequent menstruation with regular cycle: Secondary | ICD-10-CM | POA: Diagnosis not present

## 2017-11-30 DIAGNOSIS — Z01411 Encounter for gynecological examination (general) (routine) with abnormal findings: Secondary | ICD-10-CM | POA: Diagnosis not present

## 2017-12-13 ENCOUNTER — Other Ambulatory Visit: Payer: Self-pay

## 2017-12-13 ENCOUNTER — Encounter: Payer: Self-pay | Admitting: Family Medicine

## 2017-12-13 ENCOUNTER — Ambulatory Visit: Payer: BLUE CROSS/BLUE SHIELD | Admitting: Family Medicine

## 2017-12-13 VITALS — BP 112/74 | HR 81 | Temp 98.6°F | Resp 18 | Ht 65.16 in | Wt 158.2 lb

## 2017-12-13 DIAGNOSIS — K580 Irritable bowel syndrome with diarrhea: Secondary | ICD-10-CM

## 2017-12-13 DIAGNOSIS — M62830 Muscle spasm of back: Secondary | ICD-10-CM

## 2017-12-13 MED ORDER — CYCLOBENZAPRINE HCL 5 MG PO TABS: ORAL_TABLET | ORAL | 5 refills | Status: DC

## 2017-12-13 MED ORDER — HYOSCYAMINE SULFATE 0.125 MG PO TABS: ORAL_TABLET | ORAL | 5 refills | Status: DC

## 2017-12-13 NOTE — Patient Instructions (Addendum)
IF you received an x-ray today, you will receive an invoice from [facility]. Please contact [facility] at [phone] with questions or concerns regarding your invoice.   IF you received labwork today, you will receive an invoice from [place]. Please contact [facility] at [phone] with questions or concerns regarding your invoice.   Our billing staff will not be able to assist you with questions regarding bills from these companies.  You will be contacted with the lab results as soon as they are available. The fastest way to get your results is to activate your My Chart account. Instructions are located on the last page of this paperwork. If you have not heard from [name] regarding the results in 2 weeks, please contact this office.     Muscle Cramps and Spasms Muscle cramps and spasms occur when a muscle or muscles tighten and you have no control over this tightening (involuntary muscle contraction). They are a common problem and can develop in any muscle. The most common place is in the calf muscles of the leg. Muscle cramps and muscle spasms are both involuntary muscle contractions, but there are some differences between the two:  Muscle cramps are painful. They come and go and may last a few seconds to 15 minutes. Muscle cramps are often more forceful and last longer than muscle spasms.  Muscle spasms may or may not be painful. They may also last just a few seconds or much longer.  Certain medical conditions, such as diabetes or Parkinson disease, can make it more likely to develop cramps or spasms. However, cramps or spasms are usually not caused by a serious underlying problem. Common causes include:  Overexertion.  Overuse from repetitive motions, or doing the same thing over and over.  Remaining in a certain position for a long period of time.  Improper preparation, form, or technique while playing a sport or doing an  activity.  Dehydration.  Injury.  Side effects of some medicines.  Abnormally low levels of the salts and ions in your blood (electrolytes), especially potassium and calcium. This could happen if you are taking water pills (diuretics) or if you are pregnant.  In many cases, the cause of muscle cramps or spasms is unknown. Follow these instructions at home:  Stay well hydrated. Drink enough fluid to keep your urine clear or pale yellow.  Try massaging, stretching, and relaxing the affected muscle.  If directed, apply heat to tight or tense muscles as often as told by your health care provider. Use the heat source that your health care provider recommends, such as a moist heat pack or a heating pad. ? Place a towel between your skin and the heat source. ? Leave the heat on for 20-30 minutes. ? Remove the heat if your skin turns bright red. This is especially important if you are unable to feel pain, heat, or cold. You may have a greater risk of getting burned.  If directed, put ice on the affected area. This may help if you are sore or have pain after a cramp or spasm. ? Put ice in a plastic bag. ? Place a towel between your skin and the bag. ? Leavethe ice on for 20 minutes, 2-3 times a day.  Take over-the-counter and prescription medicines only as told by your health care provider.  Pay attention to any changes in your symptoms. Contact a health care provider if:  Your cramps or spasms get more severe or happen more often.  Your  cramps or spasms do not improve over time. This information is not intended to replace advice given to you by your health care provider. Make sure you discuss any questions you have with your health care provider. Document Released: 04/29/2002 Document Revised: 12/09/2015 Document Reviewed: 08/11/2015 Elsevier Interactive Patient Education  2018 [employer].

## 2017-12-13 NOTE — Progress Notes (Signed)
Chief Complaint  Patient presents with  . Medication Refill    Flexeril and Hyoscyamine Sulfate    HPI Back muscle spasm Pt reports that she works in [employer], [employer] and had a stressful end of year She has been having pain in the upper part of her back and her neck and lower back She was taking flexeril prn She denies any exercise She denies radiating pain She would describe it as a discomfort with muscle spasms  IBS with diarrhea She reports that she has IBS with diarrhea The death of her father Nov 08, 2017 lead to more stress She needs a refill of her Hyoscyamine sulfate She denies blood in her stools She reports that her stools are really loose   4 review of systems  Past Medical History:  Diagnosis Date  . Abnormal Pap smear   . Allergy   . Anemia   . Anxiety   . Arthritis   . Cyst of breast    right breast  . Encounter for insertion of mirena IUD 09/27/2010  . Fibroid   . GERD (gastroesophageal reflux disease)   . H/O cervicitis   . H/O menorrhagia   . History of allergic rhinitis   . History of iron deficiency   . History of vitamin D deficiency   . Hypoglycemia   . IBS (irritable bowel syndrome)   . Pelvic pain in female    h/o  . UTI (urinary tract infection)    h/o    Current Outpatient Medications  Medication Sig Dispense Refill  . ALPRAZolam (XANAX) 0.25 MG tablet Take 1 tablet (0.25 mg total) by mouth at bedtime as needed for anxiety. 30 tablet 0  . cyclobenzaprine (FLEXERIL) 5 MG tablet Take 1-2 tablets at bedtime for back pain and spasms. 60 tablet 5  . hyoscyamine (LEVSIN, ANASPAZ) 0.125 MG tablet TAKE 1 TABLET BY MOUTH EVERY 6 HOURS AS NEEDED FOR CRAMPING 30 tablet 5  . levonorgestrel (MIRENA) 20 MCG/24HR IUD 1 each by Intrauterine route once.    . meloxicam (MOBIC) 7.5 MG tablet Take 1 tablet (7.5 mg total) by mouth daily. TAKE 1 TABLET BY MOUTH DAILY AS NEEDED FOR PAIN. TAKE WITH FOOD. NO OTHER NSAIDS 30 tablet 2  . Multiple Vitamin (MULTIVITAMIN)  tablet Take 1 tablet by mouth daily.    . pantoprazole (PROTONIX) 40 MG tablet Take 1 tablet (40 mg total) by mouth daily. 30 tablet 5  . Vitamin D, Ergocalciferol, (DRISDOL) 50000 units CAPS capsule Take 1 capsule (50,000 Units total) by mouth every 7 (seven) days. (Patient not taking: Reported on 09/13/2017) 12 capsule 3   No current facility-administered medications for this visit.     Allergies: No Known Allergies  Past Surgical History:  Procedure Laterality Date  . ANKLE SURGERY  06/2011  . DILATION AND CURETTAGE OF UTERUS  09/29/2005  . HERNIA REPAIR    . HYSTEROSCOPY  09/29/2005  . MYOMECTOMY      Social History   Socioeconomic History  . Marital status: Married    Spouse name: Will  . Number of children: 2  . Years of education: 12+  . Highest education level: None  Social Needs  . Financial resource strain: None  . Food insecurity - worry: None  . Food insecurity - inability: None  . Transportation needs - medical: None  . Transportation needs - non-medical: None  Occupational History  . Occupation: TITLE Armed forces operational officer: [employer]  Tobacco Use  . Smoking status: Never  Smoker  . Smokeless tobacco: Never Used  Substance and Sexual Activity  . Alcohol use: No  . Drug use: No  . Sexual activity: Yes    Birth control/protection: IUD  Other Topics Concern  . None  Social History Narrative   Lives with her husband and their 2 children, when they are not at school.    Family History  Problem Relation Age of Onset  . Breast cancer Mother   . Hypertension Mother   . Cancer Mother 6       breast  . Hypertension Father   . Hyperlipidemia Father   . Cancer Maternal Grandfather 28       Prostate     ROS Review of Systems See HPI Constitution: No fevers or chills No malaise No diaphoresis Skin: No rash or itching Eyes: no blurry vision, no double vision GU: no dysuria or hematuria Neuro: no dizziness or headaches all others reviewed and  negative   Objective: Vitals:   12/13/17 1611  BP: 112/74  Pulse: 81  Resp: 18  Temp: 98.6 F (37 C)  TempSrc: Oral  SpO2: 99%  Weight: 158 lb 3.2 oz (71.8 kg)  Height: 5' 5.16" (1.655 m)    Physical Exam Physical Exam  Constitutional: She is oriented to person, place, and time. She appears well-developed and well-nourished.  HENT:  Head: Normocephalic and atraumatic.  Eyes: Conjunctivae and EOM are normal.  Cardiovascular: Normal rate, regular rhythm and normal heart sounds.   Pulmonary/Chest: Effort normal and breath sounds normal. No respiratory distress. She has no wheezes.  Abdominal: mild distention and bowel sounds are normal. There is no tenderness. There is no CVA tenderness.  Neurological: She is alert and oriented to person, place, and time.  Back exam: neck muscle tension and spasms  Assessment and Plan Klani was seen today for medication refill.  Diagnoses and all orders for this visit:  Irritable bowel syndrome with diarrhea- refilled meds, advised to use some metamucil, adequate hydration  Muscle spasm of back- advised stretching with steam room  Other orders -     Cancel: Flu Vaccine QUAD 36+ mos IM -     hyoscyamine (LEVSIN, ANASPAZ) 0.125 MG tablet; TAKE 1 TABLET BY MOUTH EVERY 6 HOURS AS NEEDED FOR CRAMPING -     cyclobenzaprine (FLEXERIL) 5 MG tablet; Take 1-2 tablets at bedtime for back pain and spasms.     [place]

## 2017-12-27 ENCOUNTER — Other Ambulatory Visit: Payer: Self-pay

## 2017-12-27 ENCOUNTER — Encounter: Payer: Self-pay | Admitting: Family Medicine

## 2017-12-27 ENCOUNTER — Ambulatory Visit: Payer: BLUE CROSS/BLUE SHIELD | Admitting: Family Medicine

## 2017-12-27 VITALS — BP 122/76 | HR 98 | Temp 99.5°F | Resp 16 | Ht 65.16 in | Wt 156.0 lb

## 2017-12-27 DIAGNOSIS — J029 Acute pharyngitis, unspecified: Secondary | ICD-10-CM | POA: Diagnosis not present

## 2017-12-27 DIAGNOSIS — J069 Acute upper respiratory infection, unspecified: Secondary | ICD-10-CM

## 2017-12-27 LAB — POCT RAPID STREP A (OFFICE): Rapid Strep A Screen: NEGATIVE

## 2017-12-27 NOTE — Progress Notes (Signed)
Chief Complaint  Patient presents with  . Sore Throat    with bodyaches since 12/24/17, some chills off/on.  Taking tylenol cold and sinus for the sxs    HPI  Pt reports sore throat since 12/24/17 She has been using tylenol cold and sinus She states that it helps for a while but hurts right away She denies fevers She reports dry cough and nasal congestion  She denies nausea or vomiting She denies sick contacts Her niece has the flu    Past Medical History:  Diagnosis Date  . Abnormal Pap smear   . Allergy   . Anemia   . Anxiety   . Arthritis   . Cyst of breast    right breast  . Encounter for insertion of mirena IUD 09/27/2010  . Fibroid   . GERD (gastroesophageal reflux disease)   . H/O cervicitis   . H/O menorrhagia   . History of allergic rhinitis   . History of iron deficiency   . History of vitamin D deficiency   . Hypoglycemia   . IBS (irritable bowel syndrome)   . Pelvic pain in female    h/o  . UTI (urinary tract infection)    h/o    Current Outpatient Medications  Medication Sig Dispense Refill  . ALPRAZolam (XANAX) 0.25 MG tablet Take 1 tablet (0.25 mg total) by mouth at bedtime as needed for anxiety. 30 tablet 0  . cyclobenzaprine (FLEXERIL) 5 MG tablet Take 1-2 tablets at bedtime for back pain and spasms. 60 tablet 5  . hyoscyamine (LEVSIN, ANASPAZ) 0.125 MG tablet TAKE 1 TABLET BY MOUTH EVERY 6 HOURS AS NEEDED FOR CRAMPING 30 tablet 5  . levonorgestrel (MIRENA) 20 MCG/24HR IUD 1 each by Intrauterine route once.    . meloxicam (MOBIC) 7.5 MG tablet Take 1 tablet (7.5 mg total) by mouth daily. TAKE 1 TABLET BY MOUTH DAILY AS NEEDED FOR PAIN. TAKE WITH FOOD. NO OTHER NSAIDS 30 tablet 2  . Multiple Vitamin (MULTIVITAMIN) tablet Take 1 tablet by mouth daily.    . pantoprazole (PROTONIX) 40 MG tablet Take 1 tablet (40 mg total) by mouth daily. 30 tablet 5  . Vitamin D, Ergocalciferol, (DRISDOL) 50000 units CAPS capsule Take 1 capsule (50,000 Units total) by  mouth every 7 (seven) days. 12 capsule 3   No current facility-administered medications for this visit.     Allergies:  Allergies  Allergen Reactions  . Other     Past Surgical History:  Procedure Laterality Date  . ANKLE SURGERY  06/2011  . DILATION AND CURETTAGE OF UTERUS  09/29/2005  . HERNIA REPAIR    . HYSTEROSCOPY  09/29/2005  . MYOMECTOMY      Social History   Socioeconomic History  . Marital status: Married    Spouse name: Will  . Number of children: 2  . Years of education: 12+  . Highest education level: None  Social Needs  . Financial resource strain: None  . Food insecurity - worry: None  . Food insecurity - inability: None  . Transportation needs - medical: None  . Transportation needs - non-medical: None  Occupational History  . Occupation: TITLE Armed forces operational officer: [employer]  Tobacco Use  . Smoking status: Never Smoker  . Smokeless tobacco: Never Used  Substance and Sexual Activity  . Alcohol use: No  . Drug use: No  . Sexual activity: Yes    Birth control/protection: IUD  Other Topics Concern  . None  Social History Narrative   Lives with her husband and their 2 children, when they are not at school.    Family History  Problem Relation Age of Onset  . Breast cancer Mother   . Hypertension Mother   . Cancer Mother 67       breast  . Hypertension Father   . Hyperlipidemia Father   . Cancer Maternal Grandfather 41       Prostate     ROS Review of Systems See HPI Constitution: No fevers or chills No malaise No diaphoresis Skin: No rash or itching Eyes: no blurry vision, no double vision GU: no dysuria or hematuria Neuro: no dizziness or headaches  all others reviewed and negative   Objective: Vitals:   12/27/17 1625  BP: 122/76  Pulse: 98  Resp: 16  Temp: 99.5 F (37.5 C)  TempSrc: Oral  SpO2: 98%  Weight: 156 lb (70.8 kg)  Height: 5' 5.16" (1.655 m)    Physical Exam  General: alert, oriented, in NAD Head:  normocephalic, atraumatic, no sinus tenderness Eyes: EOM intact, no scleral icterus or conjunctival injection Ears: TM clear bilaterally Nose: mucosa +erythematous, +edematous Throat: no pharyngeal exudate or erythema Lymph: no posterior auricular, submental or cervical lymph adenopathy Heart: normal rate, normal sinus rhythm, no murmurs Lungs: clear to auscultation bilaterally, no wheezing   Assessment and Plan Velva was seen today for sore throat.  Diagnoses and all orders for this visit:  Sore throat -     POCT rapid strep A  Acute URI  Advised flonase Pt was off work M-W Advised to stay home W-F Gave work note for the week Change tylenol cold and sinus to mucinex and flonase Follow up prn   [place]

## 2017-12-27 NOTE — Patient Instructions (Signed)
     IF you received an x-ray today, you will receive an invoice from [facility]. Please contact [facility] at [phone] with questions or concerns regarding your invoice.   IF you received labwork today, you will receive an invoice from [facility]. Please contact [facility] at [phone] with questions or concerns regarding your invoice.   Our billing staff will not be able to assist you with questions regarding bills from these companies.  You will be contacted with the lab results as soon as they are available. The fastest way to get your results is to activate your My Chart account. Instructions are located on the last page of this paperwork. If you have not heard from us regarding the results in 2 weeks, please contact this office.     

## 2018-01-12 DIAGNOSIS — L814 Other melanin hyperpigmentation: Secondary | ICD-10-CM | POA: Diagnosis not present

## 2018-01-12 DIAGNOSIS — L910 Hypertrophic scar: Secondary | ICD-10-CM | POA: Diagnosis not present

## 2018-01-12 DIAGNOSIS — L821 Other seborrheic keratosis: Secondary | ICD-10-CM | POA: Diagnosis not present

## 2018-01-12 DIAGNOSIS — D229 Melanocytic nevi, unspecified: Secondary | ICD-10-CM | POA: Diagnosis not present

## 2018-02-09 DIAGNOSIS — L910 Hypertrophic scar: Secondary | ICD-10-CM | POA: Diagnosis not present

## 2018-02-09 DIAGNOSIS — R208 Other disturbances of skin sensation: Secondary | ICD-10-CM | POA: Diagnosis not present

## 2018-03-01 ENCOUNTER — Encounter: Payer: Self-pay | Admitting: Podiatry

## 2018-03-01 ENCOUNTER — Ambulatory Visit: Payer: BLUE CROSS/BLUE SHIELD | Admitting: Podiatry

## 2018-03-01 ENCOUNTER — Ambulatory Visit (INDEPENDENT_AMBULATORY_CARE_PROVIDER_SITE_OTHER): Payer: BLUE CROSS/BLUE SHIELD

## 2018-03-01 DIAGNOSIS — M1990 Unspecified osteoarthritis, unspecified site: Secondary | ICD-10-CM | POA: Insufficient documentation

## 2018-03-01 DIAGNOSIS — M779 Enthesopathy, unspecified: Secondary | ICD-10-CM | POA: Diagnosis not present

## 2018-03-01 DIAGNOSIS — N920 Excessive and frequent menstruation with regular cycle: Secondary | ICD-10-CM | POA: Insufficient documentation

## 2018-03-01 DIAGNOSIS — G8928 Other chronic postprocedural pain: Secondary | ICD-10-CM | POA: Insufficient documentation

## 2018-03-01 MED ORDER — GABAPENTIN 300 MG PO CAPS: 300.0000 mg | ORAL_CAPSULE | Freq: Two times a day (BID) | ORAL | 5 refills | Status: DC

## 2018-03-03 NOTE — Progress Notes (Signed)
Subjective:  Patient ID: Sandra Booker, female    DOB: 08-Jul-1971,  MRN: [id number] HPI Chief Complaint  Patient presents with  . Foot Pain    Lateral ankle and "whole foot" right - patient has had 2 past ankle surgeries, now having a lot of pain, radiates to knee, been soaking, wearing ankle brace, meloxicam-no help  . New Patient (Initial Visit)    47 y.o. female presents with the above complaint.   ROS: Denies fever chills nausea vomiting muscle aches and pains.  Denies calf pain chest pain shortness of breath headache.  Past Medical History:  Diagnosis Date  . Abnormal Pap smear   . Allergy   . Anemia   . Anxiety   . Arthritis   . Cyst of breast    right breast  . Encounter for insertion of mirena IUD 09/27/2010  . Fibroid   . GERD (gastroesophageal reflux disease)   . H/O cervicitis   . H/O menorrhagia   . History of allergic rhinitis   . History of iron deficiency   . History of vitamin D deficiency   . Hypoglycemia   . IBS (irritable bowel syndrome)   . Pelvic pain in female    h/o  . UTI (urinary tract infection)    h/o   Past Surgical History:  Procedure Laterality Date  . ANKLE SURGERY  06/2011  . DILATION AND CURETTAGE OF UTERUS  09/29/2005  . HERNIA REPAIR    . HYSTEROSCOPY  09/29/2005  . MYOMECTOMY      Current Outpatient Medications:  .  ALPRAZolam (XANAX) 0.25 MG tablet, Take 1 tablet (0.25 mg total) by mouth at bedtime as needed for anxiety., Disp: 30 tablet, Rfl: 0 .  cyclobenzaprine (FLEXERIL) 5 MG tablet, Take 1-2 tablets at bedtime for back pain and spasms., Disp: 60 tablet, Rfl: 5 .  gabapentin (NEURONTIN) 300 MG capsule, Take 1 capsule (300 mg total) by mouth 2 (two) times daily., Disp: 60 capsule, Rfl: 5 .  hyoscyamine (LEVSIN, ANASPAZ) 0.125 MG tablet, TAKE 1 TABLET BY MOUTH EVERY 6 HOURS AS NEEDED FOR CRAMPING, Disp: 30 tablet, Rfl: 5 .  levonorgestrel (MIRENA) 20 MCG/24HR IUD, 1 each by Intrauterine route once., Disp: , Rfl:  .  meloxicam  (MOBIC) 7.5 MG tablet, Take 1 tablet (7.5 mg total) by mouth daily. TAKE 1 TABLET BY MOUTH DAILY AS NEEDED FOR PAIN. TAKE WITH FOOD. NO OTHER NSAIDS, Disp: 30 tablet, Rfl: 2 .  Multiple Vitamin (MULTIVITAMIN) tablet, Take 1 tablet by mouth daily., Disp: , Rfl:  .  pantoprazole (PROTONIX) 40 MG tablet, Take 1 tablet (40 mg total) by mouth daily., Disp: 30 tablet, Rfl: 5 .  Vitamin D, Ergocalciferol, (DRISDOL) 50000 units CAPS capsule, Take 1 capsule (50,000 Units total) by mouth every 7 (seven) days., Disp: 12 capsule, Rfl: 3  Allergies  Allergen Reactions  . Other    Review of Systems Objective:  There were no vitals filed for this visit.  General: Well developed, nourished, in no acute distress, alert and oriented x3   Dermatological: Skin is warm, dry and supple bilateral. Nails x 10 are well maintained; remaining integument appears unremarkable at this time. There are no open sores, no preulcerative lesions, no rash or signs of infection present.  Vascular: Dorsalis Pedis artery and Posterior Tibial artery pedal pulses are 2/4 bilateral with immedate capillary fill time. Pedal hair growth present. No varicosities and no lower extremity edema present bilateral.   Neruologic: Grossly intact via light touch bilateral.  Vibratory intact via tuning fork bilateral. Protective threshold with Semmes Wienstein monofilament intact to all pedal sites bilateral. Patellar and Achilles deep tendon reflexes 2+ bilateral. No Babinski or clonus noted bilateral.   Musculoskeletal: No gross boney pedal deformities bilateral. No pain, crepitus, or limitation noted with foot and ankle range of motion bilateral. Muscular strength 5/5 in all groups tested bilateral.  She has pain on palpation of the right ankle laterally she has very little in the way of inversion but eversion is good.  She does have soft tissue swelling in the posterior lateral compartment of the ankle.  And tenderness along the palpation of the  sural nerve.  She states that she has complete numbness to the top of the foot but yet has radiating pain to her knee.  Gait: Unassisted, Nonantalgic.    Radiographs:  Radiographs taken today do not demonstrate any type of osseous abnormalities to the ankle right.  No acute findings.  Assessment & Plan:   Assessment: There appears to have been trauma to the sural distribution after the fibular surgery.  This is resulted in a neuritis and chronic swelling to the posterior lateral aspect of the right ankle  Plan: I am going to retry gabapentin 600 mg.  300 mg at lunch and 300 mg at bedtime.  I will follow-up with her in 1 month to see how this is doing for her.    Aubriegh Minch T. [place], [place]

## 2018-04-03 ENCOUNTER — Encounter: Payer: Self-pay | Admitting: Podiatry

## 2018-04-03 ENCOUNTER — Ambulatory Visit (INDEPENDENT_AMBULATORY_CARE_PROVIDER_SITE_OTHER): Payer: BLUE CROSS/BLUE SHIELD | Admitting: Podiatry

## 2018-04-03 DIAGNOSIS — M779 Enthesopathy, unspecified: Secondary | ICD-10-CM | POA: Diagnosis not present

## 2018-04-03 MED ORDER — GABAPENTIN 600 MG PO TABS: 600.0000 mg | ORAL_TABLET | Freq: Two times a day (BID) | ORAL | 3 refills | Status: DC

## 2018-04-03 NOTE — Progress Notes (Signed)
She presents today for follow-up of her right ankle and her whole foot states that the whole foot still hurts she has swelling but the meds seem to be helping as she refers to 300 mg of gabapentin at lunchtime and bedtime.  Objective: Vital signs are stable she is alert and oriented x3 decrease in pain on palpation of the foot.  Pulses remain palpable.  No significant changes in swelling.  Assessment: Neuritis of the foot cannot rule out back associated issues.  Plan: Increase her gabapentin 600 mg lunch and bedtime.  Follow-up with her in 1 month.

## 2018-04-10 ENCOUNTER — Other Ambulatory Visit: Payer: Self-pay | Admitting: Urgent Care

## 2018-04-11 NOTE — Telephone Encounter (Signed)
mychart message sent to pt about making apt

## 2018-04-27 ENCOUNTER — Ambulatory Visit: Payer: BLUE CROSS/BLUE SHIELD | Admitting: Family Medicine

## 2018-04-27 ENCOUNTER — Other Ambulatory Visit: Payer: Self-pay

## 2018-04-27 ENCOUNTER — Encounter: Payer: Self-pay | Admitting: Family Medicine

## 2018-04-27 VITALS — BP 116/68 | HR 86 | Temp 98.4°F | Ht 64.0 in | Wt 158.0 lb

## 2018-04-27 DIAGNOSIS — R8290 Unspecified abnormal findings in urine: Secondary | ICD-10-CM

## 2018-04-27 DIAGNOSIS — R3129 Other microscopic hematuria: Secondary | ICD-10-CM | POA: Diagnosis not present

## 2018-04-27 DIAGNOSIS — K219 Gastro-esophageal reflux disease without esophagitis: Secondary | ICD-10-CM | POA: Diagnosis not present

## 2018-04-27 DIAGNOSIS — K580 Irritable bowel syndrome with diarrhea: Secondary | ICD-10-CM | POA: Diagnosis not present

## 2018-04-27 LAB — POCT URINALYSIS DIP (MANUAL ENTRY)
BILIRUBIN UA: NEGATIVE
BILIRUBIN UA: NEGATIVE mg/dL
Glucose, UA: NEGATIVE mg/dL
Leukocytes, UA: NEGATIVE
Nitrite, UA: NEGATIVE
PH UA: 6 (ref 5.0–8.0)
PROTEIN UA: NEGATIVE mg/dL
SPEC GRAV UA: 1.02 (ref 1.010–1.025)
Urobilinogen, UA: 0.2 E.U./dL

## 2018-04-27 LAB — POC MICROSCOPIC URINALYSIS (UMFC): Mucus: ABSENT

## 2018-04-27 MED ORDER — PANTOPRAZOLE SODIUM 40 MG PO TBEC: 40.0000 mg | DELAYED_RELEASE_TABLET | Freq: Every day | ORAL | 3 refills | Status: DC

## 2018-04-27 MED ORDER — HYOSCYAMINE SULFATE 0.125 MG PO TABS: ORAL_TABLET | ORAL | 5 refills | Status: DC

## 2018-04-27 NOTE — Patient Instructions (Addendum)
   IF you received an x-ray today, you will receive an invoice from [facility]. Please contact [facility] at [phone] with questions or concerns regarding your invoice.   IF you received labwork today, you will receive an invoice from [facility]. Please contact [facility] at [phone] with questions or concerns regarding your invoice.   Our billing staff will not be able to assist you with questions regarding bills from these companies.  You will be contacted with the lab results as soon as they are available. The fastest way to get your results is to activate your My Chart account. Instructions are located on the last page of this paperwork. If you have not heard from us regarding the results in 2 weeks, please contact this office.    Irritable Bowel Syndrome, Adult Irritable bowel syndrome (IBS) is not one specific disease. It is a group of symptoms that affects the organs responsible for digestion (gastrointestinal or GI tract). To regulate how your GI tract works, your body sends signals back and forth between your intestines and your brain. If you have IBS, there may be a problem with these signals. As a result, your GI tract does not function normally. Your intestines may become more sensitive and overreact to certain things. This is especially true when you eat certain foods or when you are under stress. There are four types of IBS. These may be determined based on the consistency of your stool:  IBS with diarrhea.  IBS with constipation.  Mixed IBS.  Unsubtyped IBS.  It is important to know which type of IBS you have. Some treatments are more likely to be helpful for certain types of IBS. What are the causes? The exact cause of IBS is not known. What increases the risk? You may have a higher risk of IBS if:  You are a woman.  You are younger than 49 years old.  You have a family history of IBS.  You have mental health problems.  You have had  bacterial infection of your GI tract.  What are the signs or symptoms? Symptoms of IBS vary from person to person. The main symptom is abdominal pain or discomfort. Additional symptoms usually include one or more of the following:  Diarrhea, constipation, or both.  Abdominal swelling or bloating.  Feeling full or sick after eating a small or regular-size meal.  Frequent gas.  Mucus in the stool.  A feeling of having more stool left after a bowel movement.  Symptoms tend to come and go. They may be associated with stress, psychiatric conditions, or nothing at all. How is this diagnosed? There is no specific test to diagnose IBS. Your health care provider will make a diagnosis based on a physical exam, medical history, and your symptoms. You may have other tests to rule out other conditions that may be causing your symptoms. These may include:  Blood tests.  X-rays.  CT scan.  Endoscopy and colonoscopy. This is a test in which your GI tract is viewed with a long, thin, flexible tube.  How is this treated? There is no cure for IBS, but treatment can help relieve symptoms. IBS treatment often includes:  Changes to your diet, such as: ? Eating more fiber. ? Avoiding foods that cause symptoms. ? Drinking more water. ? Eating regular, medium-sized portioned meals.  Medicines. These may include: ? Fiber supplements if you have constipation. ? Medicine to control diarrhea (antidiarrheal medicines). ? Medicine to help control muscle spasms in your GI   tract (antispasmodic medicines). ? Medicines to help with any mental health issues, such as antidepressants or tranquilizers.  Therapy. ? Talk therapy may help with anxiety, depression, or other mental health issues that can make IBS symptoms worse.  Stress reduction. ? Managing your stress can help keep symptoms under control.  Follow these instructions at home:  Take medicines only as directed by your health care  provider.  Eat a healthy diet. ? Avoid foods and drinks with added sugar. ? Include more whole grains, fruits, and vegetables gradually into your diet. This may be especially helpful if you have IBS with constipation. ? Avoid any foods and drinks that make your symptoms worse. These may include dairy products and caffeinated or carbonated drinks. ? Do not eat large meals. ? Drink enough fluid to keep your urine clear or pale yellow.  Exercise regularly. Ask your health care provider for recommendations of good activities for you.  Keep all follow-up visits as directed by your health care provider. This is important. Contact a health care provider if:  You have constant pain.  You have trouble or pain with swallowing.  You have worsening diarrhea. Get help right away if:  You have severe and worsening abdominal pain.  You have diarrhea and: ? You have a rash, stiff neck, or severe headache. ? You are irritable, sleepy, or difficult to awaken. ? You are weak, dizzy, or extremely thirsty.  You have bright red blood in your stool or you have black tarry stools.  You have unusual abdominal swelling that is painful.  You vomit continuously.  You vomit blood (hematemesis).  You have both abdominal pain and a fever. This information is not intended to replace advice given to you by your health care provider. Make sure you discuss any questions you have with your health care provider. Document Released: 11/07/2005 Document Revised: 04/08/2016 Document Reviewed: 07/25/2014 Elsevier Interactive Patient Education  2018 Elsevier Inc.  

## 2018-04-27 NOTE — Progress Notes (Signed)
Chief Complaint  Patient presents with  . Follow-up    recheck on urine and medication refill    HPI  Abnormal Urine Pt is here to get a repeat UA.  She states that she had UTI symptoms that cleared up at home. She noticed that the urine still had a strong odor She has chronic back pain and spasms but no new pain She denies fevers or chills  Encounter For Medication Refills IBS She reports that she has noticed by avoiding certain foods her IBS is better She takes Hyoscyamine as needed She denies blood BM She denies unintentional weight loss  GERD She reports that she occasionally stops protonix  But takes it when her reflux flares back up Risk factors: NSAID use. She has arthritis and takes meloxicam  She needs refills of her Protonix which she is tolerating well.    4 review of systems  Past Medical History:  Diagnosis Date  . Abnormal Pap smear   . Allergy   . Anemia   . Anxiety   . Arthritis   . Cyst of breast    right breast  . Encounter for insertion of mirena IUD 09/27/2010  . Fibroid   . GERD (gastroesophageal reflux disease)   . H/O cervicitis   . H/O menorrhagia   . History of allergic rhinitis   . History of iron deficiency   . History of vitamin D deficiency   . Hypoglycemia   . IBS (irritable bowel syndrome)   . Pelvic pain in female    h/o  . UTI (urinary tract infection)    h/o    Current Outpatient Medications  Medication Sig Dispense Refill  . ALPRAZolam (XANAX) 0.25 MG tablet Take 1 tablet (0.25 mg total) by mouth at bedtime as needed for anxiety. 30 tablet 0  . cyclobenzaprine (FLEXERIL) 5 MG tablet Take 1-2 tablets at bedtime for back pain and spasms. 60 tablet 5  . gabapentin (NEURONTIN) 600 MG tablet Take 1 tablet (600 mg total) by mouth 2 (two) times daily. 60 tablet 3  . hyoscyamine (LEVSIN, ANASPAZ) 0.125 MG tablet TAKE 1 TABLET BY MOUTH EVERY 6 HOURS AS NEEDED FOR CRAMPING 30 tablet 5  . levonorgestrel (MIRENA) 20 MCG/24HR IUD 1  each by Intrauterine route once.    . meloxicam (MOBIC) 7.5 MG tablet Take 1 tablet (7.5 mg total) by mouth daily. TAKE 1 TABLET BY MOUTH DAILY AS NEEDED FOR PAIN. TAKE WITH FOOD. NO OTHER NSAIDS 30 tablet 2  . Multiple Vitamin (MULTIVITAMIN) tablet Take 1 tablet by mouth daily.    . pantoprazole (PROTONIX) 40 MG tablet Take 1 tablet (40 mg total) by mouth daily. 90 tablet 3  . Vitamin D, Ergocalciferol, (DRISDOL) 50000 units CAPS capsule Take 1 capsule (50,000 Units total) by mouth every 7 (seven) days. 12 capsule 3   No current facility-administered medications for this visit.     Allergies:  Allergies  Allergen Reactions  . Other     Past Surgical History:  Procedure Laterality Date  . ANKLE SURGERY  06/2011  . DILATION AND CURETTAGE OF UTERUS  09/29/2005  . HERNIA REPAIR    . HYSTEROSCOPY  09/29/2005  . MYOMECTOMY      Social History   Socioeconomic History  . Marital status: Married    Spouse name: Will  . Number of children: 2  . Years of education: 12+  . Highest education level: Not on file  Occupational History  . Occupation: TITLE Scientist, clinical (histocompatibility and immunogenetics)  Employer: [employer]  Social Needs  . Financial resource strain: Not on file  . Food insecurity:    Worry: Not on file    Inability: Not on file  . Transportation needs:    Medical: Not on file    Non-medical: Not on file  Tobacco Use  . Smoking status: Never Smoker  . Smokeless tobacco: Never Used  Substance and Sexual Activity  . Alcohol use: No  . Drug use: No  . Sexual activity: Yes    Birth control/protection: IUD  Lifestyle  . Physical activity:    Days per week: Not on file    Minutes per session: Not on file  . Stress: Not on file  Relationships  . Social connections:    Talks on phone: Not on file    Gets together: Not on file    Attends religious service: Not on file    Active member of club or organization: Not on file    Attends meetings of clubs or organizations: Not on file     Relationship status: Not on file  Other Topics Concern  . Not on file  Social History Narrative   Lives with her husband and their 2 children, when they are not at school.    Family History  Problem Relation Age of Onset  . Breast cancer Mother   . Hypertension Mother   . Cancer Mother 57       breast  . Hypertension Father   . Hyperlipidemia Father   . Cancer Maternal Grandfather 49       Prostate     ROS Review of Systems See HPI Constitution: No fevers or chills No malaise No diaphoresis Skin: No rash or itching Eyes: no blurry vision, no double vision GU: no dysuria or hematuria Neuro: no dizziness or headaches  all others reviewed and negative   Objective: Vitals:   04/27/18 0846  BP: 116/68  Pulse: 86  Temp: 98.4 F (36.9 C)  TempSrc: Oral  SpO2: 100%  Weight: 158 lb (71.7 kg)  Height: 5\' 4"  (1.626 m)  . Wt Readings from Last 3 Encounters:  04/27/18 158 lb (71.7 kg)  12/27/17 156 lb (70.8 kg)  12/13/17 158 lb 3.2 oz (71.8 kg)   Body mass index is 27.12 kg/m.   Physical Exam  Physical Exam  Constitutional: She is oriented to person, place, and time. She appears well-developed and well-nourished.  HENT:  Head: Normocephalic and atraumatic.  Eyes: Conjunctivae and EOM are normal.  Cardiovascular: Normal rate, regular rhythm and normal heart sounds.   Pulmonary/Chest: Effort normal and breath sounds normal. No respiratory distress. She has no wheezes.  Abdominal: Normal appearance and bowel sounds are normal. There is no tenderness. There is no CVA tenderness.  Neurological: She is alert and oriented to person, place, and time.    Assessment and Plan Jonne was seen today for follow-up.  Diagnoses and all orders for this visit:  Abnormal urine- confirmed acute UTI -     POCT urinalysis dipstick  Microscopic hematuria- urine culture showed E. Coli susceptible to everything including macrobid Will treat with macrobid -     Urine Culture -      POCT Microscopic Urinalysis ([facility])  Gastroesophageal reflux disease without esophagitis- stable on Protonix -     pantoprazole (PROTONIX) 40 MG tablet; Take 1 tablet (40 mg total) by mouth daily.  Irritable bowel syndrome with diarrhea- stable on Levsin Continue to avoid triggers Stress management and probiotic  discussed  -     hyoscyamine (LEVSIN, ANASPAZ) 0.125 MG tablet; TAKE 1 TABLET BY MOUTH EVERY 6 HOURS AS NEEDED FOR CRAMPING       Caulin Begley A Len Azeez

## 2018-04-29 LAB — URINE CULTURE

## 2018-04-30 MED ORDER — NITROFURANTOIN MONOHYD MACRO 100 MG PO CAPS: 100.0000 mg | ORAL_CAPSULE | Freq: Two times a day (BID) | ORAL | 0 refills | Status: AC

## 2018-05-08 ENCOUNTER — Encounter: Payer: Self-pay | Admitting: Podiatry

## 2018-05-08 ENCOUNTER — Ambulatory Visit (INDEPENDENT_AMBULATORY_CARE_PROVIDER_SITE_OTHER): Payer: BLUE CROSS/BLUE SHIELD | Admitting: Podiatry

## 2018-05-08 DIAGNOSIS — G5791 Unspecified mononeuropathy of right lower limb: Secondary | ICD-10-CM | POA: Diagnosis not present

## 2018-05-08 NOTE — Progress Notes (Signed)
She presents today for follow-up of neuropathy to the right lower extremity.  She states that is not improving with any of the medication is not improving with anything that we have done currently.  Objective: Vital signs are stable alert oriented x3 pulses are palpable.  No change in neurovascular status does pain across the dorsal aspect of the foot.  Assessment: Neuritis neuropathy.  Plan: I feel is necessary to send to neurology for evaluation and treatment.  Until then she is to continue her gabapentin 300 mg at lunchtime 600 mg at bedtime.

## 2018-05-09 ENCOUNTER — Telehealth: Payer: Self-pay | Admitting: *Deleted

## 2018-05-09 DIAGNOSIS — G629 Polyneuropathy, unspecified: Secondary | ICD-10-CM

## 2018-05-09 NOTE — Telephone Encounter (Signed)
-----   Message from Rip Harbour, [facility] sent at 05/08/2018  3:37 PM EDT ----- Regarding: Neuro referral Neurologist referral - evaluate neuropathy right lower extremity

## 2018-05-09 NOTE — Telephone Encounter (Signed)
-----   Message from Rip Harbour, [facility] sent at 05/08/2018  3:37 PM EDT ----- Regarding: Neuro referral Neurologist referral - evaluate neuropathy right lower extremity

## 2018-05-09 NOTE — Telephone Encounter (Signed)
Referral, clinicals and demographics faxed to [facility]. 

## 2018-05-10 ENCOUNTER — Encounter: Payer: Self-pay | Admitting: Neurology

## 2018-06-25 ENCOUNTER — Ambulatory Visit: Payer: BLUE CROSS/BLUE SHIELD | Admitting: Neurology

## 2018-06-25 ENCOUNTER — Encounter: Payer: Self-pay | Admitting: Neurology

## 2018-06-25 VITALS — BP 120/70 | HR 86 | Ht 64.0 in | Wt 160.4 lb

## 2018-06-25 DIAGNOSIS — M79671 Pain in right foot: Secondary | ICD-10-CM

## 2018-06-25 DIAGNOSIS — R202 Paresthesia of skin: Secondary | ICD-10-CM | POA: Diagnosis not present

## 2018-06-25 NOTE — Progress Notes (Signed)
[facility] Clinic Note - Initial Visit   Date: 06/25/18  Sandra Booker MRN: [id number] DOB: 29-Jan-1971   Dear Dr. Milinda Pointer:  Thank you for your kind referral of Sandra Booker for consultation of right foot pain. Although her history is well known to you, please allow [name] to reiterate it for the purpose of our medical record. The patient was accompanied to the clinic by self.   History of Present Illness: Sandra Booker is a 47 y.o. right-handed African American female with GERD and low back pain presenting for evaluation of right foot pain.    She had right ankle surgery in 2011 and again in 2012.  Since this time, she has numbness/tingling and throbbing pain in the right ankle which can radiate into the lower leg and toes.  It is worse with prolonged standing or wearing tight shoes.  She does not have left foot pain.  She is taking gabapentin 600mg  at bedtime without any relief.  She also takes meloxicam for pain, which eases some of her pain.  She denies weakness or falls.    Out-side paper records, electronic medical record, and images have been reviewed where available and summarized as:  Lab Results  Component Value Date   TSH 0.58 09/30/2016   Lab Results  Component Value Date   [id number] 681 12/23/2015   Lab Results  Component Value Date   HGBA1C 5.9 (H) 01/24/2014     Past Medical History:  Diagnosis Date  . Abnormal Pap smear   . Allergy   . Anemia   . Anxiety   . Arthritis   . Cyst of breast    right breast  . Encounter for insertion of mirena IUD 09/27/2010  . Fibroid   . GERD (gastroesophageal reflux disease)   . H/O cervicitis   . H/O menorrhagia   . History of allergic rhinitis   . History of iron deficiency   . History of vitamin D deficiency   . Hypoglycemia   . IBS (irritable bowel syndrome)   . Pelvic pain in female    h/o  . UTI (urinary tract infection)    h/o    Past Surgical History:  Procedure  Laterality Date  . ANKLE SURGERY  06/2011  . DILATION AND CURETTAGE OF UTERUS  09/29/2005  . HERNIA REPAIR    . HYSTEROSCOPY  09/29/2005  . MYOMECTOMY       Medications:  Outpatient Encounter Medications as of 06/25/2018  Medication Sig Note  . ALPRAZolam (XANAX) 0.25 MG tablet Take 1 tablet (0.25 mg total) by mouth at bedtime as needed for anxiety.   . cyclobenzaprine (FLEXERIL) 5 MG tablet Take 1-2 tablets at bedtime for back pain and spasms.   Marland Kitchen gabapentin (NEURONTIN) 600 MG tablet Take 1 tablet (600 mg total) by mouth 2 (two) times daily.   . hyoscyamine (LEVSIN, ANASPAZ) 0.125 MG tablet TAKE 1 TABLET BY MOUTH EVERY 6 HOURS AS NEEDED FOR CRAMPING   . levonorgestrel (MIRENA) 20 MCG/24HR IUD 1 each by Intrauterine route once. 12/23/2015: implant  . meloxicam (MOBIC) 7.5 MG tablet Take 1 tablet (7.5 mg total) by mouth daily. TAKE 1 TABLET BY MOUTH DAILY AS NEEDED FOR PAIN. TAKE WITH FOOD. NO OTHER NSAIDS   . Multiple Vitamin (MULTIVITAMIN) tablet Take 1 tablet by mouth daily.   . pantoprazole (PROTONIX) 40 MG tablet Take 1 tablet (40 mg total) by mouth daily.   . Vitamin D, Ergocalciferol, (DRISDOL) 50000 units CAPS capsule Take  1 capsule (50,000 Units total) by mouth every 7 (seven) days.    No facility-administered encounter medications on file as of 06/25/2018.      Allergies:  Allergies  Allergen Reactions  . Other     Family History: Family History  Problem Relation Age of Onset  . Breast cancer Mother   . Hypertension Mother   . Cancer Mother 37       breast  . Hypertension Father   . Hyperlipidemia Father   . Cancer Maternal Grandfather 65       Prostate    Social History: Social History   Tobacco Use  . Smoking status: Never Smoker  . Smokeless tobacco: Never Used  Substance Use Topics  . Alcohol use: No  . Drug use: No   Social History   Social History Narrative   Lives with her husband and their 2 children, when they are not at school.   She works as an  Barista.   Highest level of education: some college    Review of Systems:  CONSTITUTIONAL: No fevers, chills, night sweats, or weight loss.   EYES: No visual changes or eye pain ENT: No hearing changes.  No history of nose bleeds.   RESPIRATORY: No cough, wheezing and shortness of breath.   CARDIOVASCULAR: Negative for chest pain, and palpitations.   GI: Negative for abdominal discomfort, blood in stools or black stools.  No recent change in bowel habits.   GU:  No history of incontinence.   MUSCLOSKELETAL: +history of joint pain or swelling.  No myalgias.   SKIN: Negative for lesions, rash, and itching.   HEMATOLOGY/ONCOLOGY: Negative for prolonged bleeding, bruising easily, and swollen nodes.  No history of cancer.   ENDOCRINE: Negative for cold or heat intolerance, polydipsia or goiter.   PSYCH:  No depression or anxiety symptoms.   NEURO: As Above.   Vital Signs:  BP 120/70   Pulse 86   Ht 5\' 4"  (1.626 m)   Wt 160 lb 6 oz (72.7 kg)   SpO2 99%   BMI 27.53 kg/m    General Medical Exam:   General:  Well appearing, comfortable.   Eyes/ENT: see cranial nerve examination.   Neck: No masses appreciated.  Full range of motion without tenderness.  No carotid bruits. Respiratory:  Clear to auscultation, good air entry bilaterally.   Cardiac:  Regular rate and rhythm, no murmur.   Extremities:  No deformities, edema, or skin discoloration.  Skin: Right lateral malleolus with overlying old scar.  No rashes or lesions.  Neurological Exam: MENTAL STATUS including orientation to time, place, person, recent and remote memory, attention span and concentration, language, and fund of knowledge is normal.  Speech is not dysarthric.  CRANIAL NERVES: II:  No visual field defects.  Unremarkable fundi.   III-IV-VI: Pupils equal round and reactive to light.  Normal conjugate, extra-ocular eye movements in all directions of gaze.  No nystagmus. Mild left ptosis (old).   V:  Normal facial  sensation.     VII:  Normal facial symmetry and movements.  VIII:  Normal hearing and vestibular function.   IX-X:  Normal palatal movement.   XI:  Normal shoulder shrug and head rotation.   XII:  Normal tongue strength and range of motion, no deviation or fasciculation.  MOTOR:  No atrophy, fasciculations or abnormal movements.  No pronator drift.  Tone is normal.    Right Upper Extremity:    Left Upper Extremity:  Deltoid  5/5   Deltoid  5/5   Biceps  5/5   Biceps  5/5   Triceps  5/5   Triceps  5/5   Wrist extensors  5/5   Wrist extensors  5/5   Wrist flexors  5/5   Wrist flexors  5/5   Finger extensors  5/5   Finger extensors  5/5   Finger flexors  5/5   Finger flexors  5/5   Dorsal interossei  5/5   Dorsal interossei  5/5   Abductor pollicis  5/5   Abductor pollicis  5/5   Tone (Ashworth scale)  0  Tone (Ashworth scale)  0   Right Lower Extremity:    Left Lower Extremity:    Hip flexors  5/5   Hip flexors  5/5   Hip extensors  5/5   Hip extensors  5/5   Knee flexors  5/5   Knee flexors  5/5   Knee extensors  5/5   Knee extensors  5/5   Dorsiflexors  5/5   Dorsiflexors  5/5   Plantarflexors  5/5   Plantarflexors  5/5   Toe extensors  5/5   Toe extensors  5/5   Toe flexors  5/5   Toe flexors  5/5   Tone (Ashworth scale)  0  Tone (Ashworth scale)  0   MSRs:  Right                                                                 Left brachioradialis 2+  brachioradialis 2+  biceps 2+  biceps 2+  triceps 2+  triceps 2+  patellar 2+  patellar 2+  ankle jerk 1+  ankle jerk 1+  Hoffman no  Hoffman no  plantar response down  plantar response down   SENSORY:  Reduced pin prick over the lateral surface of the right foot. Vibration and temperature intact  COORDINATION/GAIT: Normal finger-to- nose-finger.  Intact rapid alternating movements bilaterally.   Gait narrow based and stable. Heel and toe walking is impaired on the right. Tandem gait intact.    IMPRESSION: Right ankle  pain with paresthesia involving the lateral ankle and foot s/p ankle surgery (2011, 2012).  Recommend NCS/EMG of the right > left leg to evaluate for localized nerve pathology, such as sural neuropathy vs more of a widespread peroneal mononeuropathy.  Some of her localized pain has MSK quality (throbbing, achy) and if her nerve testing is normal, she may have more of a pain syndrome, such as complex regional pain syndrome.  Continue gabapentin 600mg  at bedtime.   Further recommendations pending results   Thank you for allowing me to participate in patient's care.  If I can answer any additional questions, I would be pleased to do so.    Sincerely,    Jamiee Milholland K. Posey Pronto, DO

## 2018-06-25 NOTE — Patient Instructions (Signed)
NCS/EMG of the right > left leg  ELECTROMYOGRAM AND NERVE CONDUCTION STUDIES (EMG/NCS) INSTRUCTIONS  How to Prepare The neurologist conducting the EMG will need to know if you have certain medical conditions. Tell the neurologist and other EMG lab personnel if you: . Have a pacemaker or any other electrical medical device . Take blood-thinning medications . Have hemophilia, a blood-clotting disorder that causes prolonged bleeding Bathing Take a shower or bath shortly before your exam in order to remove oils from your skin. Don't apply lotions or creams before the exam.  What to Expect You'll likely be asked to change into a hospital gown for the procedure and lie down on an examination table. The following explanations can help you understand what will happen during the exam.  . Electrodes. The neurologist or a technician places surface electrodes at various locations on your skin depending on where you're experiencing symptoms. Or the neurologist may insert needle electrodes at different sites depending on your symptoms.  . Sensations. The electrodes will at times transmit a tiny electrical current that you may feel as a twinge or spasm. The needle electrode may cause discomfort or pain that usually ends shortly after the needle is removed. If you are concerned about discomfort or pain, you may want to talk to the neurologist about taking a short break during the exam.  . Instructions. During the needle EMG, the neurologist will assess whether there is any spontaneous electrical activity when the muscle is at rest - activity that isn't present in healthy muscle tissue - and the degree of activity when you slightly contract the muscle.  He or she will give you instructions on resting and contracting a muscle at appropriate times. Depending on what muscles and nerves the neurologist is examining, he or she may ask you to change positions during the exam.  After your EMG You may experience some  temporary, minor bruising where the needle electrode was inserted into your muscle. This bruising should fade within several days. If it persists, contact your primary care doctor.

## 2018-06-26 ENCOUNTER — Ambulatory Visit (INDEPENDENT_AMBULATORY_CARE_PROVIDER_SITE_OTHER): Payer: BLUE CROSS/BLUE SHIELD | Admitting: Neurology

## 2018-06-26 DIAGNOSIS — G5781 Other specified mononeuropathies of right lower limb: Secondary | ICD-10-CM | POA: Diagnosis not present

## 2018-06-26 DIAGNOSIS — R202 Paresthesia of skin: Secondary | ICD-10-CM

## 2018-06-26 DIAGNOSIS — Z9889 Other specified postprocedural states: Secondary | ICD-10-CM | POA: Diagnosis not present

## 2018-06-26 DIAGNOSIS — M79671 Pain in right foot: Secondary | ICD-10-CM | POA: Diagnosis not present

## 2018-06-26 MED ORDER — NORTRIPTYLINE HCL 10 MG PO CAPS: ORAL_CAPSULE | ORAL | 3 refills | Status: DC

## 2018-06-26 NOTE — Progress Notes (Signed)
Follow-up Visit   Date: 06/26/18    Sandra Booker MRN: [id number] DOB: 1971-06-03   Interim History: Sandra Booker is a 47 y.o. African American female with GERD and low back pain returning to the clinic for electrodiagnostic testing of the right foot.  History of present illness: She had right ankle surgery in 2011 and again in 2012.  Since this time, she has numbness/tingling and throbbing pain in the right ankle which can radiate into the lower leg and toes.  It is worse with prolonged standing or wearing tight shoes.  She does not have left foot pain.  She is taking gabapentin 600mg  at bedtime without any relief.  She also takes meloxicam for pain, which eases some of her pain.  She denies weakness or falls.    UPDATE 8/6/82019:  She is here for electrodiagnostic testing and review results. Upon further questioning, she tells me that her second ankle surgery was performed because of a nerve problem.    Medications:  Current Outpatient Medications on File Prior to Visit  Medication Sig Dispense Refill  . ALPRAZolam (XANAX) 0.25 MG tablet Take 1 tablet (0.25 mg total) by mouth at bedtime as needed for anxiety. 30 tablet 0  . cyclobenzaprine (FLEXERIL) 5 MG tablet Take 1-2 tablets at bedtime for back pain and spasms. 60 tablet 5  . gabapentin (NEURONTIN) 600 MG tablet Take 1 tablet (600 mg total) by mouth 2 (two) times daily. 60 tablet 3  . hyoscyamine (LEVSIN, ANASPAZ) 0.125 MG tablet TAKE 1 TABLET BY MOUTH EVERY 6 HOURS AS NEEDED FOR CRAMPING 30 tablet 5  . levonorgestrel (MIRENA) 20 MCG/24HR IUD 1 each by Intrauterine route once.    . meloxicam (MOBIC) 7.5 MG tablet Take 1 tablet (7.5 mg total) by mouth daily. TAKE 1 TABLET BY MOUTH DAILY AS NEEDED FOR PAIN. TAKE WITH FOOD. NO OTHER NSAIDS 30 tablet 2  . Multiple Vitamin (MULTIVITAMIN) tablet Take 1 tablet by mouth daily.    . pantoprazole (PROTONIX) 40 MG tablet Take 1 tablet (40 mg total) by mouth daily. 90 tablet 3  .  Vitamin D, Ergocalciferol, (DRISDOL) 50000 units CAPS capsule Take 1 capsule (50,000 Units total) by mouth every 7 (seven) days. 12 capsule 3   No current facility-administered medications on file prior to visit.     Allergies:  Allergies  Allergen Reactions  . Other     Formal neurological exam is deferred  Data: NCS/EMG of the right leg 06/26/2018: 1. The electrophysiologic findings are most consistent with a right sural neuropathy. 2. Incidentally, there is a right accessory peroneal nerve, a normal variant.  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  She also complains of significant throbbing and achy pain localized to the lateral ankle.   Proceed with MRI right ankle without contrast will be ordered to evaluate for nerve entrapment by scar tissue, localized edema, or neuroma.  Start nortriptyline 10mg  at bedtime for 2 week, then increase to 2 tablet at bedtime Reduce gabapentin to 300mg  at bedtime to minimize cognitive side effects and sedation  Greater than 50% of this 20 minute visit was spent in counseling, explanation of diagnosis, planning of further management, and coordination of care.  Thank you for allowing me to participate in patient's care.  If I can answer any additional questions, I would be pleased to do so.    Sincerely,    Donika K. Posey Pronto, DO

## 2018-06-27 ENCOUNTER — Other Ambulatory Visit: Payer: Self-pay | Admitting: *Deleted

## 2018-06-27 DIAGNOSIS — M79671 Pain in right foot: Secondary | ICD-10-CM

## 2018-06-27 DIAGNOSIS — R202 Paresthesia of skin: Secondary | ICD-10-CM

## 2018-06-27 DIAGNOSIS — Z9889 Other specified postprocedural states: Secondary | ICD-10-CM

## 2018-06-27 DIAGNOSIS — G5781 Other specified mononeuropathies of right lower limb: Secondary | ICD-10-CM

## 2018-06-27 NOTE — Progress Notes (Signed)
MRI ordered and EMG linked.

## 2018-06-27 NOTE — Procedures (Signed)
[facility] Neurology  [place], [place]  [place], [place] [address] Tel: [phone] Fax:  [phone] Test Date:  06/26/2018  Patient: Sandra Booker DOB: 1971-04-06 Physician: Narda Amber, DO  Sex: Female Height: 5\' 4"  Ref Phys: Narda Amber, DO  ID#: [id number] Temp: 34.0C Technician:    Patient Complaints: This is a 47 year-old female s/p right ankle surgery x 2 referred for evaluation of right foot pain and numbness over the lateral foot.  NCV & EMG Findings: Extensive electrodiagnostic testing of the right lower extremity and additional studies of the left shows:  1. Right sural sensory response is asymmetrically reduced (7.3 V) as compared to the left (23.6 V). Bilateral superficial peroneal sensory responses are within normal limits. 2. Bilateral peroneal and tibial motor responses are within normal limits. Of note, the right peroneal motor response when stimulating proximally was reduced due to anomalous innervation by accessory peroneal nerve to the extensor digiti brevis muscle, which is a normal variant. 3. There is no evidence of active or chronic motor axon loss changes affecting any of the tested muscles. Motor unit configuration and recruitment pattern is within normal limits.  Impression: 1. The electrophysiologic findings are most consistent with a right sural neuropathy. 2. Incidentally, there is a right accessory peroneal nerve, a normal variant.   ___________________________ Narda Amber, DO    Nerve Conduction Studies Anti Sensory Summary Table   Site NR Peak (ms) Norm Peak (ms) P-T Amp (V) Norm P-T Amp  Right Sup Peroneal Anti Sensory (Ant Lat Mall)  34C  12 cm    2.3 <4.5 16.6 >5  Left Sural Anti Sensory (Lat Mall)  34C  Calf    2.8 <4.5 23.6 >5  Right Sural Anti Sensory (Lat Mall)  34C  Calf    2.7 <4.5 7.3 >5  Site 2    3.1  6.8    Motor Summary Table   Site NR Onset (ms) Norm Onset (ms) O-P Amp (mV) Norm O-P Amp Site1 Site2  Delta-0 (ms) Dist (cm) Vel (m/s) Norm Vel (m/s)  Right Peroneal Motor (Ext Dig Brev)  34C  Ankle    2.4 <5.5 6.2 >3 B Fib Ankle 7.2 40.0 56 >40  B Fib    9.6  4.5  Poplt B Fib 1.1 8.0 73 >40  Poplt    10.7  4.2         Medial malleolus    4.8  5.0         Right Peroneal TA Motor (Tib Ant)  34C  Fib Head    1.9 <4.0 7.0 >4 Poplit Fib Head 1.1 7.0 64 >40  Poplit    3.0  6.8         Right Tibial Motor (Abd Hall Brev)  34C  Ankle    3.1 <6.0 8.4 >8 Knee Ankle 7.5 42.0 56 >40  Knee    10.6  4.6          H Reflex Studies   NR H-Lat (ms) Lat Norm (ms) L-R H-Lat (ms)  Right Tibial (Gastroc)  34C     26.94 <35    EMG   Side Muscle Ins Act Fibs Psw Fasc Number Recrt Dur Dur. Amp Amp. Poly Poly. Comment  Right AntTibialis Nml Nml Nml Nml Nml Nml Nml Nml Nml Nml Nml Nml N/A  Right Gastroc Nml Nml Nml Nml Nml Nml Nml Nml Nml Nml Nml Nml N/A  Right Flex Dig Long Nml Nml Nml Nml Nml  Nml Nml Nml Nml Nml Nml Nml N/A  Right RectFemoris Nml Nml Nml Nml Nml Nml Nml Nml Nml Nml Nml Nml N/A  Right BicepsFemS Nml Nml Nml Nml Nml Nml Nml Nml Nml Nml Nml Nml N/A      Waveforms:

## 2018-06-27 NOTE — Progress Notes (Signed)
Patient notified

## 2018-06-29 ENCOUNTER — Other Ambulatory Visit: Payer: Self-pay | Admitting: *Deleted

## 2018-06-29 DIAGNOSIS — G5781 Other specified mononeuropathies of right lower limb: Secondary | ICD-10-CM

## 2018-06-29 DIAGNOSIS — M79671 Pain in right foot: Secondary | ICD-10-CM

## 2018-06-29 DIAGNOSIS — R202 Paresthesia of skin: Secondary | ICD-10-CM

## 2018-07-13 ENCOUNTER — Ambulatory Visit
Admission: RE | Admit: 2018-07-13 | Discharge: 2018-07-13 | Disposition: A | Discharge status: Home / Self Care | Payer: BLUE CROSS/BLUE SHIELD | Source: Ambulatory Visit | Attending: Neurology | Admitting: Neurology

## 2018-07-13 DIAGNOSIS — R202 Paresthesia of skin: Secondary | ICD-10-CM

## 2018-07-13 DIAGNOSIS — G5781 Other specified mononeuropathies of right lower limb: Secondary | ICD-10-CM

## 2018-07-13 DIAGNOSIS — R600 Localized edema: Secondary | ICD-10-CM | POA: Diagnosis not present

## 2018-07-13 DIAGNOSIS — M79671 Pain in right foot: Secondary | ICD-10-CM

## 2018-07-18 ENCOUNTER — Telehealth: Payer: Self-pay | Admitting: Neurology

## 2018-07-18 NOTE — Telephone Encounter (Signed)
Patient called and would like to speak with you regarding her MRI results and other options she may have as far as an Regulatory affairs officer. Please Call. Thanks

## 2018-07-18 NOTE — Telephone Encounter (Signed)
Called and discussed results of MRI foot with patient which does not show structural pathology of the sural nerve at this level. There is minimal swelling at the ankle, otherwise MSK structures are intact.  She was informed that there is nothing surgical that can help and management is symptomatic.  Patient has appreciated mild improvement of pain with nortriptyline 10mg  at bedtime and will be titrating to 20mg  at bedtime this weekend.    Return to clinic in 3-4 months.

## 2018-07-19 NOTE — Telephone Encounter (Signed)
Patient would like to speak with you regarding an ortho Referral? Please Call. Thanks

## 2018-07-20 NOTE — Telephone Encounter (Signed)
Left message for patient that I spoke with Dr. Posey Pronto and she does not recommend ortho referral.  Requested for her to call me if she has any further questions.

## 2018-08-02 ENCOUNTER — Encounter: Payer: BLUE CROSS/BLUE SHIELD | Admitting: Family Medicine

## 2018-08-07 ENCOUNTER — Other Ambulatory Visit: Payer: Self-pay

## 2018-08-07 ENCOUNTER — Encounter: Payer: Self-pay | Admitting: Family Medicine

## 2018-08-07 ENCOUNTER — Ambulatory Visit (INDEPENDENT_AMBULATORY_CARE_PROVIDER_SITE_OTHER): Payer: BLUE CROSS/BLUE SHIELD | Admitting: Family Medicine

## 2018-08-07 VITALS — BP 117/78 | HR 88 | Temp 99.5°F | Resp 17 | Ht 64.0 in | Wt 160.8 lb

## 2018-08-07 DIAGNOSIS — Z0000 Encounter for general adult medical examination without abnormal findings: Secondary | ICD-10-CM | POA: Diagnosis not present

## 2018-08-07 DIAGNOSIS — M5386 Other specified dorsopathies, lumbar region: Secondary | ICD-10-CM

## 2018-08-07 DIAGNOSIS — Z131 Encounter for screening for diabetes mellitus: Secondary | ICD-10-CM

## 2018-08-07 DIAGNOSIS — Z13220 Encounter for screening for lipoid disorders: Secondary | ICD-10-CM

## 2018-08-07 DIAGNOSIS — E559 Vitamin D deficiency, unspecified: Secondary | ICD-10-CM

## 2018-08-07 DIAGNOSIS — M62830 Muscle spasm of back: Secondary | ICD-10-CM

## 2018-08-07 MED ORDER — HYOSCYAMINE SULFATE 0.125 MG PO TABS: ORAL_TABLET | ORAL | 5 refills | Status: DC

## 2018-08-07 MED ORDER — GABAPENTIN 600 MG PO TABS: 600.0000 mg | ORAL_TABLET | Freq: Two times a day (BID) | ORAL | 3 refills | Status: DC

## 2018-08-07 MED ORDER — NORTRIPTYLINE HCL 10 MG PO CAPS: ORAL_CAPSULE | ORAL | 3 refills | Status: DC

## 2018-08-07 MED ORDER — PANTOPRAZOLE SODIUM 40 MG PO TBEC: 40.0000 mg | DELAYED_RELEASE_TABLET | Freq: Every day | ORAL | 3 refills | Status: DC

## 2018-08-07 MED ORDER — CYCLOBENZAPRINE HCL 5 MG PO TABS: ORAL_TABLET | ORAL | 5 refills | Status: DC

## 2018-08-07 MED ORDER — ALPRAZOLAM 0.25 MG PO TABS: 0.2500 mg | ORAL_TABLET | Freq: Every evening | ORAL | 0 refills | Status: DC | PRN

## 2018-08-07 MED ORDER — MELOXICAM 7.5 MG PO TABS: 7.5000 mg | ORAL_TABLET | Freq: Every day | ORAL | 2 refills | Status: DC

## 2018-08-07 NOTE — Progress Notes (Signed)
Chief Complaint  Patient presents with  . Annual Exam    cpe with no pap  . Medication Refill    xanax, flexeril, neurontin, levsin, mobic, pamelor, protonix    Subjective:  Sandra Booker is a 47 y.o. female here for a health maintenance visit.  Patient is established pt   Sciatica and Back Spasm Patient reports that she gets flares of her chronic back pain She reports that her back pain also radiates down to her leg and is worse on the right There is no numbness just tingling Denies foot drop She has no limp This episode started 07/22/18 She would like to try some additional treatments to help this   Patient Active Problem List   Diagnosis Date Noted  . Arthritis 03/01/2018  . Menorrhagia 03/01/2018  . History of iron deficiency   . H/O cervicitis   . H/O menorrhagia   . Fibroid   . Hypoglycemia   . UTI (urinary tract infection)   . Pelvic pain in female   . ASCUS favor benign   . GERD (gastroesophageal reflux disease)   . IBS (irritable bowel syndrome)   . History of vitamin D deficiency   . History of allergic rhinitis     Past Medical History:  Diagnosis Date  . Abnormal Pap smear   . Allergy   . Anemia   . Anxiety   . Arthritis   . Cyst of breast    right breast  . Encounter for insertion of mirena IUD 09/27/2010  . Fibroid   . GERD (gastroesophageal reflux disease)   . H/O cervicitis   . H/O menorrhagia   . History of allergic rhinitis   . History of iron deficiency   . History of vitamin D deficiency   . Hypoglycemia   . IBS (irritable bowel syndrome)   . Pelvic pain in female    h/o  . UTI (urinary tract infection)    h/o    Past Surgical History:  Procedure Laterality Date  . ANKLE SURGERY  06/2011  . DILATION AND CURETTAGE OF UTERUS  09/29/2005  . HERNIA REPAIR    . HYSTEROSCOPY  09/29/2005  . MYOMECTOMY       Outpatient Medications Prior to Visit  Medication Sig Dispense Refill  . levonorgestrel (MIRENA) 20 MCG/24HR IUD 1 each by  Intrauterine route once.    . Multiple Vitamin (MULTIVITAMIN) tablet Take 1 tablet by mouth daily.    Marland Kitchen ALPRAZolam (XANAX) 0.25 MG tablet Take 1 tablet (0.25 mg total) by mouth at bedtime as needed for anxiety. 30 tablet 0  . cyclobenzaprine (FLEXERIL) 5 MG tablet Take 1-2 tablets at bedtime for back pain and spasms. 60 tablet 5  . gabapentin (NEURONTIN) 600 MG tablet Take 1 tablet (600 mg total) by mouth 2 (two) times daily. 60 tablet 3  . hyoscyamine (LEVSIN, ANASPAZ) 0.125 MG tablet TAKE 1 TABLET BY MOUTH EVERY 6 HOURS AS NEEDED FOR CRAMPING 30 tablet 5  . meloxicam (MOBIC) 7.5 MG tablet Take 1 tablet (7.5 mg total) by mouth daily. TAKE 1 TABLET BY MOUTH DAILY AS NEEDED FOR PAIN. TAKE WITH FOOD. NO OTHER NSAIDS 30 tablet 2  . nortriptyline (PAMELOR) 10 MG capsule Start nortriptyline 10mg  at bedtime for 2 week, then increase to 2 tablet at bedtime 60 capsule 3  . pantoprazole (PROTONIX) 40 MG tablet Take 1 tablet (40 mg total) by mouth daily. 90 tablet 3  . Vitamin D, Ergocalciferol, (DRISDOL) 50000 units CAPS capsule Take 1  capsule (50,000 Units total) by mouth every 7 (seven) days. (Patient not taking: Reported on 08/07/2018) 12 capsule 3   No facility-administered medications prior to visit.     Allergies  Allergen Reactions  . Other      Family History  Problem Relation Age of Onset  . Breast cancer Mother   . Hypertension Mother   . Cancer Mother 47       breast  . Hypertension Father   . Hyperlipidemia Father   . Cancer Maternal Grandfather 47       Prostate     Health Habits: Dental Exam: up to date Eye Exam: up to date Exercise: 2 times/week on average Current exercise activities: walking/running Diet: balance  Social History   Socioeconomic History  . Marital status: Married    Spouse name: Will  . Number of children: 2  . Years of education: 12+  . Highest education level: Not on file  Occupational History  . Occupation: TITLE Armed forces operational officer: [employer]  Social Needs  . Financial resource strain: Not on file  . Food insecurity:    Worry: Not on file    Inability: Not on file  . Transportation needs:    Medical: Not on file    Non-medical: Not on file  Tobacco Use  . Smoking status: Never Smoker  . Smokeless tobacco: Never Used  Substance and Sexual Activity  . Alcohol use: No  . Drug use: No  . Sexual activity: Yes    Birth control/protection: IUD  Lifestyle  . Physical activity:    Days per week: Not on file    Minutes per session: Not on file  . Stress: Not on file  Relationships  . Social connections:    Talks on phone: Not on file    Gets together: Not on file    Attends religious service: Not on file    Active member of club or organization: Not on file    Attends meetings of clubs or organizations: Not on file    Relationship status: Not on file  . Intimate partner violence:    Fear of current or ex partner: Not on file    Emotionally abused: Not on file    Physically abused: Not on file    Forced sexual activity: Not on file  Other Topics Concern  . Not on file  Social History Narrative   Lives with her husband and their 2 children, when they are not at school.   She works as an Barista.   Highest level of education: some college   Social History   Substance and Sexual Activity  Alcohol Use No   Social History   Tobacco Use  Smoking Status Never Smoker  Smokeless Tobacco Never Used   Social History   Substance and Sexual Activity  Drug Use No    GYN: Sexual Health Menstrual status: regular menses LMP: No LMP recorded. (Menstrual status: IUD). Last pap smear: see HM section History of abnormal pap smears:   Health Maintenance: See under health Maintenance activity for review of completion dates as well. Immunization History  Administered Date(s) Administered  . Influenza Split 09/21/2013, 08/22/2015  . Tdap 01/24/2014      Depression Screen-PHQ2/9 Depression screen  [facility] 2/9 08/07/2018 08/07/2018 04/27/2018 12/27/2017 09/13/2017  Decreased Interest 0 0 0 0 0  Down, Depressed, Hopeless 0 0 0 0 0  PHQ - 2 Score 0 0 0 0 0  Depression Severity and Treatment Recommendations:  0-4= None  5-9= Mild / Treatment: Support, educate to call if worse; return in one month  10-14= Moderate / Treatment: Support, watchful waiting; Antidepressant or Psycotherapy  15-19= Moderately severe / Treatment: Antidepressant OR Psychotherapy  >= 20 = Major depression, severe / Antidepressant AND Psychotherapy    Review of Systems   Review of Systems  Constitutional: Negative for chills, fever and weight loss.  Respiratory: Negative for cough, shortness of breath and wheezing.   Cardiovascular: Negative for chest pain and palpitations.  Gastrointestinal: Negative for abdominal pain, nausea and vomiting.  Genitourinary: Negative for dysuria, frequency and urgency.  Skin: Negative for itching and rash.  Neurological: Negative for dizziness, tingling and headaches.  Psychiatric/Behavioral: Negative for depression. The patient is not nervous/anxious and does not have insomnia.       Objective:   Vitals:   08/07/18 0815  BP: 117/78  Pulse: 88  Resp: 17  Temp: 99.5 F (37.5 C)  TempSrc: Oral  SpO2: 100%  Weight: 160 lb 12.8 oz (72.9 kg)  Height: 5\' 4"  (1.626 m)    Body mass index is 27.6 kg/m.  Physical Exam  Constitutional: She is oriented to person, place, and time. She appears well-developed and well-nourished.  HENT:  Head: Normocephalic and atraumatic.  Right Ear: External ear normal.  Left Ear: External ear normal.  Eyes: Conjunctivae and EOM are normal.  Neck: Normal range of motion. No thyromegaly present.  Cardiovascular: Normal rate, regular rhythm, normal heart sounds and intact distal pulses.  No murmur heard. Pulmonary/Chest: Effort normal and breath sounds normal. No stridor. No respiratory distress. She has no wheezes.  Abdominal: Soft. Bowel  sounds are normal. She exhibits no distension and no mass. There is no tenderness. There is no rebound and no guarding. No hernia.  Neurological: She is alert and oriented to person, place, and time. She displays normal reflexes. She exhibits normal muscle tone. Coordination normal.  Skin: Skin is warm. Capillary refill takes less than 2 seconds.  Psychiatric: She has a normal mood and affect. Her behavior is normal. Judgment and thought content normal.   Musculoskeletal exam Right SI joint tenderness, left SI joint nontender Straight leg raise test shows tight quads Normal hip range of motion Log roll negative Legs symmetric Patellar tendon reflex 2+  LUMBAR SPINE - 2-3 VIEW  COMPARISON:  Abdominal CT 09/01/2008.  FINDINGS: There are 5 lumbar type vertebral bodies with a somewhat transitional S1 segment. The alignment is normal. The disc spaces are preserved. There is no evidence of fracture or pars defect. Intrauterine device is noted. Right pelvic phlebolith appears unchanged.  IMPRESSION: Negative lumbar spine radiographs.   Electronically Signed   By: Camie Patience M.D.   On: 12/29/2013 13:56   Assessment/Plan:   Patient was seen for a health maintenance exam.  Counseled the patient on health maintenance issues. Reviewed her health mainteance schedule and ordered appropriate tests (see orders.) Counseled on regular exercise and weight management. Recommend regular eye exams and dental cleaning.   The following issues were addressed today for health maintenance:   Okema was seen today for annual exam and medication refill.  Diagnoses and all orders for this visit:  Encounter for health maintenance examination Women's Health Maintenance Plan Advised monthly breast exam and annual mammogram Advised dental exam every six months Discussed stress management Discussed pap smear screening guidelines   Vitamin D deficiency -     Vitamin D,  25-hydroxy  Screening, lipid -  Lipid panel  Screening for diabetes mellitus  -     Comprehensive metabolic panel  Muscle spasm of back Sciatica associated with disorder of lumbar spine -     Ambulatory referral to Physical Therapy Referral placed for physical therapy Refilled neurontin, meloxicam, nortriptyline  Other orders -     gabapentin (NEURONTIN) 600 MG tablet; Take 1 tablet (600 mg total) by mouth 2 (two) times daily. -     hyoscyamine (LEVSIN, ANASPAZ) 0.125 MG tablet; TAKE 1 TABLET BY MOUTH EVERY 6 HOURS AS NEEDED FOR CRAMPING -     ALPRAZolam (XANAX) 0.25 MG tablet; Take 1 tablet (0.25 mg total) by mouth at bedtime as needed for anxiety. -     cyclobenzaprine (FLEXERIL) 5 MG tablet; Take 1-2 tablets at bedtime for back pain and spasms. -     meloxicam (MOBIC) 7.5 MG tablet; Take 1 tablet (7.5 mg total) by mouth daily. TAKE 1 TABLET BY MOUTH DAILY AS NEEDED FOR PAIN. TAKE WITH FOOD. NO OTHER NSAIDS -     pantoprazole (PROTONIX) 40 MG tablet; Take 1 tablet (40 mg total) by mouth daily. -     nortriptyline (PAMELOR) 10 MG capsule; Start nortriptyline 10mg  at bedtime for 2 week, then increase to 2 tablet at bedtime    No follow-ups on file.    Body mass index is 27.6 kg/m.:  Discussed the patient's BMI with patient. The BMI body mass index is 27.6 kg/m.     Future Appointments  Date Time Provider [place]  11/23/2018  8:00 AM Alda Berthold, DO [facility] None    Patient Instructions       If you have lab work done today you will be contacted with your lab results within the next 2 weeks.  If you have not heard from [name] then please contact us. The fastest way to get your results is to register for My Chart.   IF you received an x-ray today, you will receive an invoice from [facility]. Please contact [facility] at [phone] with questions or concerns regarding your invoice.   IF you received labwork today, you will receive an  invoice from [facility]. Please contact [facility] at [phone] with questions or concerns regarding your invoice.   Our billing staff will not be able to assist you with questions regarding bills from these companies.  You will be contacted with the lab results as soon as they are available. The fastest way to get your results is to activate your My Chart account. Instructions are located on the last page of this paperwork. If you have not heard from [name] regarding the results in 2 weeks, please contact this office.

## 2018-08-07 NOTE — Patient Instructions (Signed)
° ° ° °  If you have lab work done today you will be contacted with your lab results within the next 2 weeks.  If you have not heard from us then please contact us. The fastest way to get your results is to register for My Chart. ° ° °IF you received an x-ray today, you will receive an invoice from [facility]. Please contact [facility] at [phone] with questions or concerns regarding your invoice.  ° °IF you received labwork today, you will receive an invoice from [facility]. Please contact [facility] at [phone] with questions or concerns regarding your invoice.  ° °Our billing staff will not be able to assist you with questions regarding bills from these companies. ° °You will be contacted with the lab results as soon as they are available. The fastest way to get your results is to activate your My Chart account. Instructions are located on the last page of this paperwork. If you have not heard from us regarding the results in 2 weeks, please contact this office. °  ° ° ° °

## 2018-08-08 LAB — LIPID PANEL
Chol/HDL Ratio: 4.3 ratio (ref 0.0–4.4)
Cholesterol, Total: 204 mg/dL — ABNORMAL HIGH (ref 100–199)
HDL: 47 mg/dL (ref >39)
LDL Calculated: 128 mg/dL — ABNORMAL HIGH (ref 0–99)
Triglycerides: 145 mg/dL (ref 0–149)
VLDL Cholesterol Cal: 29 mg/dL (ref 5–40)

## 2018-08-08 LAB — COMPREHENSIVE METABOLIC PANEL
ALBUMIN: 4.5 g/dL (ref 3.5–5.5)
ALT: 13 IU/L (ref 0–32)
AST: 16 IU/L (ref 0–40)
Albumin/Globulin Ratio: 1.8 (ref 1.2–2.2)
Alkaline Phosphatase: 76 IU/L (ref 39–117)
BILIRUBIN TOTAL: 0.2 mg/dL (ref 0.0–1.2)
BUN / CREAT RATIO: 11 (ref 9–23)
BUN: 8 mg/dL (ref 6–24)
CHLORIDE: 100 mmol/L (ref 96–106)
CO2: 23 mmol/L (ref 20–29)
Calcium: 10.1 mg/dL (ref 8.7–10.2)
Creatinine, Ser: 0.71 mg/dL (ref 0.57–1.00)
GFR calc non Af Amer: 100 mL/min/{1.73_m2} (ref >59)
GFR, EST AFRICAN AMERICAN: 116 mL/min/{1.73_m2} (ref >59)
Globulin, Total: 2.5 g/dL (ref 1.5–4.5)
Glucose: 115 mg/dL — ABNORMAL HIGH (ref 65–99)
Potassium: 4.7 mmol/L (ref 3.5–5.2)
Sodium: 140 mmol/L (ref 134–144)
TOTAL PROTEIN: 7 g/dL (ref 6.0–8.5)

## 2018-08-08 LAB — VITAMIN D 25 HYDROXY (VIT D DEFICIENCY, FRACTURES): Vit D, 25-Hydroxy: 16.5 ng/mL — ABNORMAL LOW (ref 30.0–100.0)

## 2018-08-13 ENCOUNTER — Ambulatory Visit: Payer: BLUE CROSS/BLUE SHIELD | Admitting: Neurology

## 2018-08-13 ENCOUNTER — Encounter

## 2018-08-15 ENCOUNTER — Other Ambulatory Visit: Payer: Self-pay

## 2018-08-15 ENCOUNTER — Ambulatory Visit: Payer: BLUE CROSS/BLUE SHIELD | Attending: Family Medicine | Admitting: Physical Therapy

## 2018-08-15 ENCOUNTER — Encounter: Payer: Self-pay | Admitting: Physical Therapy

## 2018-08-15 DIAGNOSIS — R262 Difficulty in walking, not elsewhere classified: Secondary | ICD-10-CM | POA: Diagnosis not present

## 2018-08-15 DIAGNOSIS — M62830 Muscle spasm of back: Secondary | ICD-10-CM | POA: Diagnosis not present

## 2018-08-15 DIAGNOSIS — M5441 Lumbago with sciatica, right side: Secondary | ICD-10-CM | POA: Diagnosis not present

## 2018-08-15 DIAGNOSIS — M25671 Stiffness of right ankle, not elsewhere classified: Secondary | ICD-10-CM

## 2018-08-15 DIAGNOSIS — G8929 Other chronic pain: Secondary | ICD-10-CM

## 2018-08-15 DIAGNOSIS — M5442 Lumbago with sciatica, left side: Secondary | ICD-10-CM | POA: Diagnosis not present

## 2018-08-16 NOTE — Therapy (Signed)
[place], [place], [address] Phone: [phone]   Fax:  [phone]  Physical Therapy Evaluation  Patient Details  Name: Sandra Booker MRN: [id number] Date of Birth: [birth year] Referring Provider (PT): Dr Delia Chimes    Encounter Date: 08/15/2018  PT End of Session - 08/15/18 1648    Visit Number  1    Number of Visits  12    Date for PT Re-Evaluation  09/27/18    Authorization Type  [place] BC[employer]     PT Start Time  1630    PT Stop Time  1712    PT Time Calculation (min)  42 min    Activity Tolerance  Patient tolerated treatment well    Behavior During Therapy  [facility] for tasks assessed/performed       Past Medical History:  Diagnosis Date  . Abnormal Pap smear   . Allergy   . Anemia   . Anxiety   . Arthritis   . Cyst of breast    right breast  . Encounter for insertion of mirena IUD 09/27/2010  . Fibroid   . GERD (gastroesophageal reflux disease)   . H/O cervicitis   . H/O menorrhagia   . History of allergic rhinitis   . History of iron deficiency   . History of vitamin D deficiency   . Hypoglycemia   . IBS (irritable bowel syndrome)   . Pelvic pain in female    h/o  . UTI (urinary tract infection)    h/o    Past Surgical History:  Procedure Laterality Date  . ANKLE SURGERY  06/2011  . DILATION AND CURETTAGE OF UTERUS  09/29/2005  . HERNIA REPAIR    . HYSTEROSCOPY  09/29/2005  . MYOMECTOMY      There were no vitals filed for this visit.   Subjective Assessment - 08/15/18 1640    Subjective  Patient has a long history of intermittent low back pain. This loast episode has lasted for almost a month. It has been on and off for several months. When her pain flairs up she has ppain into both legs right > left. She also has a history of right ankle surgery in 2011 that has been swollen since.     Limitations  Standing;Walking    How long can you sit comfortably?  < 5 min     How long can  you stand comfortably?  difficulty standing for more then 5 min     How long can you walk comfortably?  difficulty walking community distances     Diagnostic tests  X-ray    Currently in Pain?  Yes    Pain Score  6     Pain Location  Back    Pain Orientation  Right;Left;Lower    Pain Descriptors / Indicators  Aching    Pain Type  Chronic pain    Pain Onset  More than a month ago    Pain Frequency  Constant    Aggravating Factors   standing, walking, pain at night lying down     Pain Relieving Factors  has tried several things but it hasnt helped; has a heating pad, icy hot,     Effect of Pain on Daily Activities  difficulty performing dialy activity          [facility] PT Assessment - 08/16/18 0001      Assessment   Medical Diagnosis  Low Back Pain     Onset Date/Surgical  Date  07/22/18    Hand Dominance  Right    Next MD Visit  none scheduled     Prior Therapy  Patient has had PT for her back 7-8 years ago      Precautions   Precautions  None      Restrictions   Weight Bearing Restrictions  No      Balance Screen   Has the patient fallen in the past 6 months  No    Has the patient had a decrease in activity level because of a fear of falling?   No    Is the patient reluctant to leave their home because of a fear of falling?   No      Home Environment   Living Environment  Private residence    Additional Comments  Steps in her house. 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     Leisure  walking      Cognition   Overall Cognitive Status  Within Functional Limits for tasks assessed    Attention  Focused    Focused Attention  Appears intact    Memory  Appears intact    Awareness  Appears intact    Problem Solving  Appears intact      Observation/Other Assessments   Observations  swelling per visual inpsection  of the right ankle     Focus on Therapeutic Outcomes (FOTO)   68% limitation       Sensation   Light Touch  Appears Intact    Additional Comments  radiates into the back of the leg R> L The pain can raidate past her knee at times       Coordination   Gross Motor Movements are Fluid and Coordinated  Yes    Fine Motor Movements are Fluid and Coordinated  Yes      AROM   Lumbar Flexion  limited 75% with pain     Lumbar Extension  limited 50 % with pain     Lumbar - Right Side Bend  Pain to the right     Lumbar - Left Side Bend  No pain to the left     Lumbar - Right Rotation  limited 25% with pain     Lumbar - Left Rotation  limited 25% wih pain       Ambulation/Gait   Gait Comments  decreased weight bearing on the right. patient shifts lateraly off the right leg. Lateral movement at the spine.                 Objective measurements completed on examination: See above findings.      [facility] Adult PT Treatment/Exercise - 08/16/18 0001      Lumbar Exercises: Stretches   Lower Trunk Rotation Limitations  x10     Piriformis Stretch  2 reps;30 seconds;Right;Left      Ankle Exercises: Stretches   Gastroc Stretch  2 reps;30 seconds             PT Education - 08/16/18 1059    Person(s) Educated  Patient    Methods  Explanation;Demonstration;Tactile cues;Verbal cues    Comprehension  Returned demonstration;Verbalized understanding       PT Short Term Goals - 08/16/18 1118      PT  SHORT TERM GOAL #1   Title  Patient will increase passive ankle Dorsi flexion by 10 degrees     Time  4    Period  Weeks    Status  New    Target Date  09/13/18      PT SHORT TERM GOAL #2   Title  Patient will improve lumbar flexion by 25% without increased pain     Time  4    Period  Weeks    Status  New    Target Date  09/13/18      PT SHORT TERM GOAL #3   Title  Patient will be independent with basic HEP    Time  4    Period  Weeks    Status  New    Target Date  09/13/18         PT Long Term Goals - 08/16/18 1122      PT LONG TERM GOAL #1   Title  Patient will mabulate 3000' with improved right single leg stance time and decreased lateral movement in order to improve community ambualtion.     Time  8    Period  Weeks    Status  New    Target Date  10/11/18      PT LONG TERM GOAL #2   Title  Patient will stand for 1 hour without increased low back pain in order to perfrom ADL's     Time  8    Period  Weeks    Status  New    Target Date  10/11/18      PT LONG TERM GOAL #3   Title  Patient will sit for 2 hours without self reported low back pain in order to work     Time  8    Period  Weeks    Status  New    Target Date  10/11/18             Plan - 08/16/18 1103    Clinical Impression Statement  Patient is a 47 year old female with lower back pain. He pain often radiates into her right leg when she stands or walks. She also has pain when she sits for too long. She has limited lumbar and hip flexion. She has significant spasming in her paraspinals and into her glutes. She also has limitations in strength and motion in her ankle. These restirctions appear to be effecting her gait and causing lateral movement at her trunk. She would benefit from skilled therapy to improve core and hip stability and improve turnk and hip mobility. She would also benefit from light ankle ROM and strengtheing.     History and Personal Factors relevant to plan of care:  2 ankle surgeries; anxiety; long standing low back pain;     Clinical Presentation  Evolving    Clinical Presentation due to:  pain that is progressivley limiting her mobility     Clinical Decision Making  Low    Rehab Potential  Good    Clinical Impairments Affecting Rehab Potential  right ankle instability and stiffness into dorsi flexion     PT Frequency  2x / week    PT Duration  8 weeks    PT Treatment/Interventions  ADLs/Self Care Home Management;Cryotherapy;Psychologist, clinical;Therapeutic activities;Therapeutic exercise;Patient/family education;Neuromuscular re-education;Manual techniques;Taping;Passive range of motion;Dry needling    PT Next Visit Plan  begin light ankle stretching; begin manual therapy to back and hip; review HEP; add light  core strengthening; review abdominal breathing; add march, ball squeeze and clam if able.     PT Home Exercise Plan  glute stretch; lateral trunk rotation; gastroc stretch; tennis ball release     Consulted and Agree with Plan of Care  Patient       Patient will benefit from skilled therapeutic intervention in order to improve the following deficits and impairments:  Abnormal gait, Pain, Postural dysfunction, Increased muscle spasms, Decreased activity tolerance, Decreased endurance, Decreased range of motion, Decreased strength, Difficulty walking  Visit Diagnosis: Chronic left-sided low back pain with bilateral sciatica - Plan: PT plan of care cert/re-cert  Muscle spasm of back - Plan: PT plan of care cert/re-cert  Difficulty in walking, not elsewhere classified - Plan: PT plan of care cert/re-cert  Stiffness of right ankle, not elsewhere classified - Plan: PT plan of care cert/re-cert     Problem List Patient Active Problem List   Diagnosis Date Noted  . Arthritis 03/01/2018  . Menorrhagia 03/01/2018  . History of iron deficiency   . H/O cervicitis   . H/O menorrhagia   . Fibroid   . Hypoglycemia   . UTI (urinary tract infection)   . Pelvic pain in female   . ASCUS favor benign   . GERD (gastroesophageal reflux disease)   . IBS (irritable bowel syndrome)   . History of vitamin D deficiency   . History of allergic rhinitis     Carney Living PT DPT  08/16/2018, 1:27 PM  [facility] [address] [place], [place], [address] Phone: [phone]   Fax:  [phone]  Name: Sandra Booker MRN: [id number] Date of Birth:  [birth year]

## 2018-08-24 ENCOUNTER — Encounter

## 2018-08-29 ENCOUNTER — Encounter: Payer: Self-pay | Admitting: Physical Therapy

## 2018-08-29 ENCOUNTER — Ambulatory Visit: Payer: BLUE CROSS/BLUE SHIELD | Attending: Family Medicine | Admitting: Physical Therapy

## 2018-08-29 DIAGNOSIS — M5442 Lumbago with sciatica, left side: Secondary | ICD-10-CM | POA: Diagnosis not present

## 2018-08-29 DIAGNOSIS — M5441 Lumbago with sciatica, right side: Secondary | ICD-10-CM | POA: Insufficient documentation

## 2018-08-29 DIAGNOSIS — M62830 Muscle spasm of back: Secondary | ICD-10-CM | POA: Diagnosis not present

## 2018-08-29 DIAGNOSIS — M25671 Stiffness of right ankle, not elsewhere classified: Secondary | ICD-10-CM | POA: Diagnosis not present

## 2018-08-29 DIAGNOSIS — R262 Difficulty in walking, not elsewhere classified: Secondary | ICD-10-CM | POA: Insufficient documentation

## 2018-08-29 DIAGNOSIS — G8929 Other chronic pain: Secondary | ICD-10-CM | POA: Diagnosis not present

## 2018-08-30 ENCOUNTER — Encounter: Payer: Self-pay | Admitting: Physical Therapy

## 2018-08-30 NOTE — Therapy (Signed)
[place], [place], [address] Phone: [phone]   Fax:  [phone]  Physical Therapy Treatment  Patient Details  Name: Sandra Booker MRN: [id number] Date of Birth: Apr 20, 1971 Referring Provider (PT): Dr Delia Chimes    Encounter Date: 08/29/2018  PT End of Session - 08/30/18 1218    Visit Number  2    Number of Visits  12    Date for PT Re-Evaluation  09/27/18    Authorization Type  Back [employer]     PT Start Time  1545    PT Stop Time  1636    PT Time Calculation (min)  51 min    Activity Tolerance  Patient tolerated treatment well    Behavior During Therapy  [facility] for tasks assessed/performed       Past Medical History:  Diagnosis Date  . Abnormal Pap smear   . Allergy   . Anemia   . Anxiety   . Arthritis   . Cyst of breast    right breast  . Encounter for insertion of mirena IUD 09/27/2010  . Fibroid   . GERD (gastroesophageal reflux disease)   . H/O cervicitis   . H/O menorrhagia   . History of allergic rhinitis   . History of iron deficiency   . History of vitamin D deficiency   . Hypoglycemia   . IBS (irritable bowel syndrome)   . Pelvic pain in female    h/o  . UTI (urinary tract infection)    h/o    Past Surgical History:  Procedure Laterality Date  . ANKLE SURGERY  06/2011  . DILATION AND CURETTAGE OF UTERUS  09/29/2005  . HERNIA REPAIR    . HYSTEROSCOPY  09/29/2005  . MYOMECTOMY      There were no vitals filed for this visit.  Subjective Assessment - 08/29/18 1550    Subjective  Patient is finding temporary relief with back stretches but reports pain can come on suddenly at certain times throughout the day. She had minor pain in her ankle with ankle stretching.     Limitations  Standing;Walking    How long can you sit comfortably?  < 5 min     How long can you stand comfortably?  difficulty standing for more then 5 min     How long can you walk comfortably?  difficulty  walking community distances     Diagnostic tests  X-ray    Currently in Pain?  Yes    Pain Score  4     Pain Orientation  Right;Left;Lower    Pain Descriptors / Indicators  Aching    Pain Type  Chronic pain    Pain Onset  More than a month ago    Pain Frequency  Constant    Aggravating Factors   standing, walking, pain at night and lying down    Pain Relieving Factors  has tried several                        [facility] Adult PT Treatment/Exercise - 08/30/18 0001      Exercises   Exercises  Lumbar      Lumbar Exercises: Stretches   Active Hamstring Stretch Limitations  90/90 3x20 sec hold     Lower Trunk Rotation Limitations  x10     Piriformis Stretch  3 reps;20 seconds      Lumbar Exercises: Supine   Clam Limitations  2x10 green  Bent Knee Raise Limitations  2x10     Other Supine Lumbar Exercises  2x10 ball squeeze with abdominal breathing       Manual Therapy   Manual Therapy  Soft tissue mobilization;Passive ROM;Manual Traction    Soft tissue mobilization  prone to lumbar parapisnlas and into upper gluteals with focus on right    Passive ROM  passive ankle stretch; Gentle passive hip flexion and IR     Manual Traction  LAD to bilateral LE with care not to grip right ankle              PT Education - 08/30/18 1218    Education Details  reviewed HEP and symptom management     Person(s) Educated  Patient    Methods  Explanation;Demonstration;Tactile cues;Verbal cues;Handout    Comprehension  Verbalized understanding;Returned demonstration;Verbal cues required;Tactile cues required       PT Short Term Goals - 08/30/18 1226      PT SHORT TERM GOAL #1   Title  Patient will increase passive ankle Dorsi flexion by 10 degrees     Time  4    Period  Weeks    Status  On-going      PT SHORT TERM GOAL #2   Title  Patient will improve lumbar flexion by 25% without increased pain     Time  4    Period  Weeks    Status  On-going      PT SHORT TERM  GOAL #3   Title  Patient will be independent with basic HEP    Time  4    Period  Weeks    Status  On-going        PT Long Term Goals - 08/16/18 1122      PT LONG TERM GOAL #1   Title  Patient will mabulate 3000' with improved right single leg stance time and decreased lateral movement in order to improve community ambualtion.     Time  8    Period  Weeks    Status  New    Target Date  10/11/18      PT LONG TERM GOAL #2   Title  Patient will stand for 1 hour without increased low back pain in order to perfrom ADL's     Time  8    Period  Weeks    Status  New    Target Date  10/11/18      PT LONG TERM GOAL #3   Title  Patient will sit for 2 hours without self reported low back pain in order to work     Time  8    Period  Weeks    Status  New    Target Date  10/11/18            Plan - 08/30/18 1220    Clinical Impression Statement  Patient tolerated treatment well.  She had no increase in pain. Her ankle motion improved with stretching. She appears to have significant ankle instability. She has spasming in her back. She would benefit from needling. Therapy switched her visit on Monday so we can do needling. Therapy added in light strengthening. She was given strengthening for her home program,.     Clinical Presentation  Evolving    Clinical Decision Making  Low    Clinical Impairments Affecting Rehab Potential  right ankle instability and stiffness into dorsi flexion     PT Frequency  2x / week  PT Duration  8 weeks    PT Treatment/Interventions  ADLs/Self Care Home Management;Cryotherapy;Occupational psychologist;Therapeutic activities;Therapeutic exercise;Patient/family education;Neuromuscular re-education;Manual techniques;Taping;Passive range of motion;Dry needling    PT Next Visit Plan  begin light ankle stretching; begin manual therapy to back and hip; review HEP; add light core strengthening; review abdominal breathing; add march, ball  squeeze and clam if able.     PT Home Exercise Plan  glute stretch; lateral trunk rotation; gastroc stretch; tennis ball release     Consulted and Agree with Plan of Care  Patient       Patient will benefit from skilled therapeutic intervention in order to improve the following deficits and impairments:  Abnormal gait, Pain, Postural dysfunction, Increased muscle spasms, Decreased activity tolerance, Decreased endurance, Decreased range of motion, Decreased strength, Difficulty walking  Visit Diagnosis: Chronic left-sided low back pain with bilateral sciatica  Muscle spasm of back  Difficulty in walking, not elsewhere classified  Stiffness of right ankle, not elsewhere classified     Problem List Patient Active Problem List   Diagnosis Date Noted  . Arthritis 03/01/2018  . Menorrhagia 03/01/2018  . History of iron deficiency   . H/O cervicitis   . H/O menorrhagia   . Fibroid   . Hypoglycemia   . UTI (urinary tract infection)   . Pelvic pain in female   . ASCUS favor benign   . GERD (gastroesophageal reflux disease)   . IBS (irritable bowel syndrome)   . History of vitamin D deficiency   . History of allergic rhinitis     Carney Living PT DPT  08/30/2018, 12:37 PM  [facility] [address] [place], [place], [address] Phone: [phone]   Fax:  [phone]  Name: BERNESE DOFFING MRN: [id number] Date of Birth: 1971/04/10

## 2018-09-03 ENCOUNTER — Ambulatory Visit: Payer: BLUE CROSS/BLUE SHIELD | Admitting: Physical Therapy

## 2018-09-03 DIAGNOSIS — M5442 Lumbago with sciatica, left side: Secondary | ICD-10-CM | POA: Diagnosis not present

## 2018-09-03 DIAGNOSIS — M25671 Stiffness of right ankle, not elsewhere classified: Secondary | ICD-10-CM

## 2018-09-03 DIAGNOSIS — G8929 Other chronic pain: Secondary | ICD-10-CM | POA: Diagnosis not present

## 2018-09-03 DIAGNOSIS — M62830 Muscle spasm of back: Secondary | ICD-10-CM

## 2018-09-03 DIAGNOSIS — R262 Difficulty in walking, not elsewhere classified: Secondary | ICD-10-CM | POA: Diagnosis not present

## 2018-09-03 DIAGNOSIS — M5441 Lumbago with sciatica, right side: Principal | ICD-10-CM

## 2018-09-04 ENCOUNTER — Encounter: Payer: Self-pay | Admitting: Physical Therapy

## 2018-09-04 NOTE — Therapy (Signed)
[place], [place], [address] Phone: [phone]   Fax:  [phone]  Physical Therapy Treatment  Patient Details  Name: Sandra Booker MRN: [id number] Date of Birth: 1971-06-11 Referring Provider (PT): Dr Delia Chimes    Encounter Date: 09/03/2018  PT End of Session - 09/04/18 0827    Visit Number  3    Number of Visits  12    Date for PT Re-Evaluation  09/27/18    Authorization Type  Back [employer]     PT Start Time  1545    PT Stop Time  1628    PT Time Calculation (min)  43 min    Activity Tolerance  Patient tolerated treatment well    Behavior During Therapy  [facility] for tasks assessed/performed       Past Medical History:  Diagnosis Date  . Abnormal Pap smear   . Allergy   . Anemia   . Anxiety   . Arthritis   . Cyst of breast    right breast  . Encounter for insertion of mirena IUD 09/27/2010  . Fibroid   . GERD (gastroesophageal reflux disease)   . H/O cervicitis   . H/O menorrhagia   . History of allergic rhinitis   . History of iron deficiency   . History of vitamin D deficiency   . Hypoglycemia   . IBS (irritable bowel syndrome)   . Pelvic pain in female    h/o  . UTI (urinary tract infection)    h/o    Past Surgical History:  Procedure Laterality Date  . ANKLE SURGERY  06/2011  . DILATION AND CURETTAGE OF UTERUS  09/29/2005  . HERNIA REPAIR    . HYSTEROSCOPY  09/29/2005  . MYOMECTOMY      There were no vitals filed for this visit.  Subjective Assessment - 09/03/18 1547    Subjective  [name] back flared up over the weekend. She was doing her hair and feels like that may have made it worse. She has been using her stretches to get the pain down but has been unable to do the exercises..     Limitations  Standing;Walking    How long can you sit comfortably?  < 5 min     How long can you stand comfortably?  difficulty standing for more then 5 min     How long can you walk  comfortably?  difficulty walking community distances     Diagnostic tests  X-ray    Currently in Pain?  Yes    Pain Score  7     Pain Orientation  Right;Left;Lower    Pain Descriptors / Indicators  Aching    Pain Onset  More than a month ago    Pain Frequency  Constant    Aggravating Factors   standing, walking, pain at night and lying down     Pain Relieving Factors  has tried several    Effect of Pain on Daily Activities  difficulty performing daily activity                        [facility] Adult PT Treatment/Exercise - 09/04/18 0001      Lumbar Exercises: Stretches   Active Hamstring Stretch Limitations  90/90 3x20 sec hold     Lower Trunk Rotation Limitations  x10     Piriformis Stretch  3 reps;20 seconds      Lumbar Exercises: Supine   Clam  Limitations  2x10 green       Manual Therapy   Manual Therapy  Soft tissue mobilization;Passive ROM;Manual Traction;Muscle Energy Technique    Soft tissue mobilization  prone to lumbar parapisnlas and into upper gluteals with focus on right; IASYM to upper glutes and lumbar paraspinals     Manual Traction  LAD to bilateral LE with care not to grip right ankle     Muscle Energy Technique  hamstring activation to the right; hip flexor on the left        Trigger Point Dry Needling - 09/04/18 0843    Consent Given?  Yes    Muscles Treated Upper Body  Upper trapezius;Longissimus    Upper Trapezius Response  Twitch reponse elicited    Longissimus Response  Twitch response elicited           PT Education - 09/04/18 0826    Education Details  Benefits and risk of TPDN; use of shoe lift    Person(s) Educated  Patient    Methods  Explanation;Demonstration;Tactile cues;Verbal cues    Comprehension  Verbalized understanding;Returned demonstration;Verbal cues required;Tactile cues required       PT Short Term Goals - 08/30/18 1226      PT SHORT TERM GOAL #1   Title  Patient will increase passive ankle Dorsi flexion by 10  degrees     Time  4    Period  Weeks    Status  On-going      PT SHORT TERM GOAL #2   Title  Patient will improve lumbar flexion by 25% without increased pain     Time  4    Period  Weeks    Status  On-going      PT SHORT TERM GOAL #3   Title  Patient will be independent with basic HEP    Time  4    Period  Weeks    Status  On-going        PT Long Term Goals - 08/16/18 1122      PT LONG TERM GOAL #1   Title  Patient will mabulate 3000' with improved right single leg stance time and decreased lateral movement in order to improve community ambualtion.     Time  8    Period  Weeks    Status  New    Target Date  10/11/18      PT LONG TERM GOAL #2   Title  Patient will stand for 1 hour without increased low back pain in order to perfrom ADL's     Time  8    Period  Weeks    Status  New    Target Date  10/11/18      PT LONG TERM GOAL #3   Title  Patient will sit for 2 hours without self reported low back pain in order to work     Time  8    Period  Weeks    Status  New    Target Date  10/11/18            Plan - 09/04/18 0829    Clinical Impression Statement  It was noted today that the patient had a left le length descrepency. Sh ewas given a showe lift and advised how to adjust it. She was also found to have a right anterior pelvic rotation or left posterior. Patient had no pain with MET for correction. Therapy perfromed trigger point dry needling to her parapsinals and her upper  gluteals. She had a good twticht response to both.  Therapy also worked on Dance movement psychotherapist reduce spamsing and inflammation.     Clinical Presentation  Evolving    Clinical Decision Making  Low    Rehab Potential  Good    Clinical Impairments Affecting Rehab Potential  right ankle instability and stiffness into dorsi flexion     PT Frequency  2x / week    PT Duration  8 weeks    PT Treatment/Interventions  ADLs/Self Care Home Management;Cryotherapy;Psychologist, clinical;Therapeutic activities;Therapeutic exercise;Patient/family education;Neuromuscular re-education;Manual techniques;Taping;Passive range of motion;Dry needling    PT Next Visit Plan  begin light ankle stretching; begin manual therapy to back and hip; review HEP; add light core strengthening; review abdominal breathing; add march, ball squeeze and clam if able. trigger point dry needling;     PT Home Exercise Plan  glute stretch; lateral trunk rotation; gastroc stretch; tennis ball release ; ball squeeze; clam shell; march;     Consulted and Agree with Plan of Care  Patient       Patient will benefit from skilled therapeutic intervention in order to improve the following deficits and impairments:  Abnormal gait, Pain, Postural dysfunction, Increased muscle spasms, Decreased activity tolerance, Decreased endurance, Decreased range of motion, Decreased strength, Difficulty walking  Visit Diagnosis: Chronic left-sided low back pain with bilateral sciatica  Muscle spasm of back  Difficulty in walking, not elsewhere classified  Stiffness of right ankle, not elsewhere classified     Problem List Patient Active Problem List   Diagnosis Date Noted  . Arthritis 03/01/2018  . Menorrhagia 03/01/2018  . History of iron deficiency   . H/O cervicitis   . H/O menorrhagia   . Fibroid   . Hypoglycemia   . UTI (urinary tract infection)   . Pelvic pain in female   . ASCUS favor benign   . GERD (gastroesophageal reflux disease)   . IBS (irritable bowel syndrome)   . History of vitamin D deficiency   . History of allergic rhinitis     Carney Living PT DPT  09/04/2018, 8:44 AM   Einar Crow SPT  09/04/2018  [facility] [address] [place], [place], [address] Phone: [phone]   Fax:  [phone]  Name: Sandra Booker MRN: [id number] Date of Birth: 20-Dec-1970

## 2018-09-10 ENCOUNTER — Ambulatory Visit: Payer: BLUE CROSS/BLUE SHIELD | Admitting: Physical Therapy

## 2018-09-14 ENCOUNTER — Other Ambulatory Visit: Payer: Self-pay | Admitting: Family Medicine

## 2018-09-14 DIAGNOSIS — E559 Vitamin D deficiency, unspecified: Secondary | ICD-10-CM

## 2018-09-14 MED ORDER — VITAMIN D (ERGOCALCIFEROL) 1.25 MG (50000 UNIT) PO CAPS: 50000.0000 [IU] | ORAL_CAPSULE | ORAL | 3 refills | Status: DC

## 2018-09-17 ENCOUNTER — Encounter: Payer: Self-pay | Admitting: Physical Therapy

## 2018-09-17 ENCOUNTER — Ambulatory Visit: Payer: BLUE CROSS/BLUE SHIELD | Admitting: Physical Therapy

## 2018-09-17 DIAGNOSIS — M25671 Stiffness of right ankle, not elsewhere classified: Secondary | ICD-10-CM | POA: Diagnosis not present

## 2018-09-17 DIAGNOSIS — M62830 Muscle spasm of back: Secondary | ICD-10-CM

## 2018-09-17 DIAGNOSIS — G8929 Other chronic pain: Secondary | ICD-10-CM | POA: Diagnosis not present

## 2018-09-17 DIAGNOSIS — M5441 Lumbago with sciatica, right side: Principal | ICD-10-CM

## 2018-09-17 DIAGNOSIS — M5442 Lumbago with sciatica, left side: Principal | ICD-10-CM

## 2018-09-17 DIAGNOSIS — R262 Difficulty in walking, not elsewhere classified: Secondary | ICD-10-CM | POA: Diagnosis not present

## 2018-09-17 NOTE — Therapy (Addendum)
[place], [place], [address] Phone: [phone]   Fax:  [phone]  Physical Therapy Treatment & Discharge  Patient Details  Name: Sandra Booker MRN: [id number] Date of Birth: May 11, 1971 Referring Provider (PT): Dr Delia Chimes    Encounter Date: 09/17/2018  PT End of Session - 09/17/18 1736    Visit Number  4    Number of Visits  12    Date for PT Re-Evaluation  09/27/18    Authorization Type  Back [employer]     PT Start Time  1545    PT Stop Time  1630    PT Time Calculation (min)  45 min    Activity Tolerance  Patient tolerated treatment well    Behavior During Therapy  [facility] for tasks assessed/performed       Past Medical History:  Diagnosis Date  . Abnormal Pap smear   . Allergy   . Anemia   . Anxiety   . Arthritis   . Cyst of breast    right breast  . Encounter for insertion of mirena IUD 09/27/2010  . Fibroid   . GERD (gastroesophageal reflux disease)   . H/O cervicitis   . H/O menorrhagia   . History of allergic rhinitis   . History of iron deficiency   . History of vitamin D deficiency   . Hypoglycemia   . IBS (irritable bowel syndrome)   . Pelvic pain in female    h/o  . UTI (urinary tract infection)    h/o    Past Surgical History:  Procedure Laterality Date  . ANKLE SURGERY  06/2011  . DILATION AND CURETTAGE OF UTERUS  09/29/2005  . HERNIA REPAIR    . HYSTEROSCOPY  09/29/2005  . MYOMECTOMY      There were no vitals filed for this visit.  Subjective Assessment - 09/17/18 1549    Subjective  Patient had increased pain yesturday but was able to decrease pain some with exercises and stretches. Had increased soreness 24-48 hours after dry needling session but believes it did help "spasms" once soreness ceased. Has got used to heel lift now and feels like it helped her feel less "wobbly"     Limitations  Standing;Walking    How long can you sit comfortably?  < 5 min     How  long can you stand comfortably?  difficulty standing for more then 5 min     How long can you walk comfortably?  difficulty walking community distances     Diagnostic tests  X-ray    Currently in Pain?  Yes    Pain Score  6     Pain Location  Back    Pain Orientation  Right;Left;Lower    Pain Descriptors / Indicators  Sore;Aching    Pain Type  Chronic pain    Pain Onset  More than a month ago    Pain Frequency  Constant    Aggravating Factors   standing, walking, pain at night and lying down     Pain Relieving Factors  has tried several     Effect of Pain on Daily Activities  difficulty performing daily activity          [facility] PT Assessment - 09/17/18 0001      AROM   Lumbar Flexion  limited 25%    Lumbar Extension  limited 50%    Lumbar - Right Side Bend  "pulling" in the back  Lumbar - Left Side Bend  "pulling" to the left    Lumbar - Right Rotation  [facility]    Lumbar - Left Rotation  [facility]      Strength   Strength Assessment Site  --   gross 4+/5 hip strength                  [facility] Adult PT Treatment/Exercise - 09/17/18 0001      Lumbar Exercises: Stretches   Active Hamstring Stretch Limitations  90/90 3x20 sec hold     Lower Trunk Rotation Limitations  x20    Piriformis Stretch  3 reps;20 seconds      Lumbar Exercises: Aerobic   Nustep  5 minutes L3      Lumbar Exercises: Supine   Clam Limitations  2x10 red    Bent Knee Raise Limitations  2x10 with red t-band    Other Supine Lumbar Exercises  2x10 ball squeeze with abdominal breathing ; bridges 2x10 on stability ball       Manual Therapy   Manual Therapy  Soft tissue mobilization;Manual Traction;Myofascial release    Soft tissue mobilization  prone to lumbar parapisnlas and into upper gluteals with focus on right;     Myofascial Release  R lumbar paraspinals     Manual Traction  LAD to bilateral LE with care not to grip right ankle              PT Education - 09/17/18 1735    Education  Details  Reviewed HEP; symptom management     Person(s) Educated  Patient    Methods  Explanation;Demonstration;Tactile cues;Verbal cues;Handout    Comprehension  Verbalized understanding;Returned demonstration       PT Short Term Goals - 09/17/18 1749      PT SHORT TERM GOAL #1   Title  Patient will increase passive ankle Dorsi flexion by 10 degrees     Time  4    Period  Weeks    Status  Not Met      PT SHORT TERM GOAL #2   Title  Patient will improve lumbar flexion by 25% without increased pain     Time  4    Period  Weeks    Status  Achieved      PT SHORT TERM GOAL #3   Title  Patient will be independent with basic HEP    Time  4    Period  Weeks    Status  Achieved        PT Long Term Goals - 09/17/18 1750      PT LONG TERM GOAL #1   Title  Patient will mabulate 3000' with improved right single leg stance time and decreased lateral movement in order to improve community ambualtion.     Time  8    Period  Weeks    Status  On-going      PT LONG TERM GOAL #2   Title  Patient will stand for 1 hour without increased low back pain in order to perfrom ADL's     Baseline  increased pain >10 minutes     Time  8    Period  Weeks    Status  On-going      PT LONG TERM GOAL #3   Title  Patient will sit for 2 hours without self reported low back pain in order to work     Baseline  constant low back pain when seated  Time  8    Period  Weeks    Status  On-going            Plan - 09/17/18 1737    Clinical Impression Statement  Pt reports some improvement in pain with PT and HEP but unable to keep pain down with ambulation and standing activities. Pt believes she has met max potential with therapy at this time secondary to believing R ankle pain may be biggest contributer to low back symptoms. Despite only having 4 sessions, pt demonstrated improvements in overall Lumbar ROM and decreased glut tightness. Unable to progress pt into standing exercises due to increased  pain in right ankle with weight bearing. Progressed core stabilization exercises with resisted hip marching and bridges. Discharging pt at this time as further assessment of right ankle is warrented.     History and Personal Factors relevant to plan of care:  2 ankle surgeries; anxiety; long standing low back pain    Clinical Presentation  Evolving    Clinical Presentation due to:  pain that is progressively limiting her mobility     Clinical Decision Making  Low    Rehab Potential  Good    Clinical Impairments Affecting Rehab Potential  right ankle instability and stiffness into dorsi flexion     PT Frequency  2x / week    PT Duration  8 weeks    PT Treatment/Interventions  ADLs/Self Care Home Management;Cryotherapy;Occupational psychologist;Therapeutic activities;Therapeutic exercise;Patient/family education;Neuromuscular re-education;Manual techniques;Taping;Passive range of motion;Dry needling    PT Next Visit Plan  begin light ankle stretching; begin manual therapy to back and hip; review HEP; add light core strengthening; review abdominal breathing; add march, ball squeeze and clam if able. trigger point dry needling;     PT Home Exercise Plan  glute stretch; lateral trunk rotation; gastroc stretch; tennis ball release ; ball squeeze; clam shell; march; bridges on stability ball    Consulted and Agree with Plan of Care  Patient       Patient will benefit from skilled therapeutic intervention in order to improve the following deficits and impairments:  Abnormal gait, Pain, Postural dysfunction, Increased muscle spasms, Decreased activity tolerance, Decreased endurance, Decreased range of motion, Decreased strength, Difficulty walking  Visit Diagnosis: Chronic left-sided low back pain with bilateral sciatica  Muscle spasm of back  Difficulty in walking, not elsewhere classified  Stiffness of right ankle, not elsewhere classified  PHYSICAL THERAPY DISCHARGE  SUMMARY  Visits from Start of Care: 4  Current functional level related to goals / functional outcomes Decreased pain intensity with activity    Remaining deficits: Pain can still reach high levels    Education / Equipment: HEP Plan: Patient agrees to discharge.  Patient goals were partially met. Patient is being discharged due to lack of progress.  ?????       Problem List Patient Active Problem List   Diagnosis Date Noted  . Arthritis 03/01/2018  . Menorrhagia 03/01/2018  . History of iron deficiency   . H/O cervicitis   . H/O menorrhagia   . Fibroid   . Hypoglycemia   . UTI (urinary tract infection)   . Pelvic pain in female   . ASCUS favor benign   . GERD (gastroesophageal reflux disease)   . IBS (irritable bowel syndrome)   . History of vitamin D deficiency   . History of allergic rhinitis     Carolyne Littles PT DPT  09/17/2018   Einar Crow SPT 09/17/2018, 5:52 PM  During this treatment session, the therapist was present, participating in and directing the treatment.  [place], [place], [address] Phone: [phone]   Fax:  [phone]  Name: Sandra Booker MRN: [id number] Date of Birth: [birth year]

## 2018-09-17 NOTE — Patient Instructions (Signed)
Hooklying Clamshell with Resistance reps: 10 sets: 3 daily: 1 weekly: 7   Exercise image step 1   Exercise image step 2 Setup  Begin by lying on your back with your knees bent, feet resting on the floor, and a resistance band or loop secured around your legs. Movement  Move your knees away from each other, creating tension in the band, then slowly return to the starting position and repeat. Tip  Make sure to not to arch your low back during the exercise. Supine March reps: 10 sets: 3 daily: 1 weekly: 7   Exercise image step 1   Exercise image step 2 Setup  Begin lying on your back with your arms resting at your sides, your knees bent and your feet flat on the ground. T-band resisting hip marching. Movement  Tighten your abdominals and slowly raise one of your legs off the floor, keeping your knee bent. Then return to the starting position and repeat with your other leg.  Tip  Make sure to keep your trunk stiff during the exercise and do not let your low back arch. Supine Bridge reps: 10 sets: 3 daily: 1 weekly: 7   Exercise image step 1   Exercise image step 2 Setup  Begin lying on your back with your arms resting at your sides, your legs bent at the knees and your feet flat on the ground. Perform on stability ball.  Movement  Tighten your abdominals and slowly lift your hips off the floor into a bridge position, keeping your back straight. Tip  Image: MedBridge logo Login URL: [email or web address] Access Code: [email or web address]  Disclaimer: This program provides exercises related to your condition that you can perform at home. As there is a risk of injury with any activity, use caution when performing exercises. If you experience any pain or discomfort, discontinue the exercises and contact your health care provider. Date printed: 09/17/2018  Make sure to keep your trunk stiff throughout the exercise and your arms flat on the floor.

## 2018-10-09 ENCOUNTER — Ambulatory Visit: Payer: BLUE CROSS/BLUE SHIELD | Admitting: Neurology

## 2018-11-02 ENCOUNTER — Other Ambulatory Visit: Payer: Self-pay | Admitting: Family Medicine

## 2018-11-02 DIAGNOSIS — Z1231 Encounter for screening mammogram for malignant neoplasm of breast: Secondary | ICD-10-CM

## 2018-11-09 ENCOUNTER — Ambulatory Visit
Admission: RE | Admit: 2018-11-09 | Discharge: 2018-11-09 | Disposition: A | Discharge status: Home / Self Care | Payer: BLUE CROSS/BLUE SHIELD | Source: Ambulatory Visit

## 2018-11-09 DIAGNOSIS — Z1231 Encounter for screening mammogram for malignant neoplasm of breast: Secondary | ICD-10-CM

## 2018-11-23 ENCOUNTER — Encounter

## 2018-11-23 ENCOUNTER — Ambulatory Visit: Payer: BLUE CROSS/BLUE SHIELD | Admitting: Neurology

## 2018-11-30 DIAGNOSIS — N920 Excessive and frequent menstruation with regular cycle: Secondary | ICD-10-CM | POA: Diagnosis not present

## 2018-11-30 DIAGNOSIS — D259 Leiomyoma of uterus, unspecified: Secondary | ICD-10-CM | POA: Diagnosis not present

## 2018-11-30 DIAGNOSIS — Z6827 Body mass index (BMI) 27.0-27.9, adult: Secondary | ICD-10-CM | POA: Diagnosis not present

## 2018-11-30 DIAGNOSIS — Z01411 Encounter for gynecological examination (general) (routine) with abnormal findings: Secondary | ICD-10-CM | POA: Diagnosis not present

## 2019-05-27 ENCOUNTER — Other Ambulatory Visit: Payer: Self-pay | Admitting: Family Medicine

## 2019-05-27 NOTE — Telephone Encounter (Signed)
Requested medication (s) are due for refill today: yes  Requested medication (s) are on the active medication list: yes  Last refill:  04/03/2019  Future visit scheduled: no  Notes to clinic: not delegated    Requested Prescriptions  Pending Prescriptions Disp Refills   cyclobenzaprine (FLEXERIL) 5 MG tablet [Pharmacy Med Name: CYCLOBENZAPRINE 5MG  TABLETS] 60 tablet 5    Sig: TAKE 1 TO 2 TABLETS BY MOUTH AT BEDTIME FOR BACK PAIN OR SPASMS     Not Delegated - Analgesics:  Muscle Relaxants Failed - 05/27/2019  1:36 PM      Failed - This refill cannot be delegated      Failed - Valid encounter within last 6 months    Recent Outpatient Visits          9 months ago Encounter for health maintenance examination   [facility] at [facility], Arlie Solomons, MD   1 year ago Abnormal urine   [facility] at [facility], Arlie Solomons, MD   1 year ago Sore throat   [facility] at [facility], Arlie Solomons, MD   1 year ago Irritable bowel syndrome with diarrhea   Primary Care at [facility], Arlie Solomons, MD   1 year ago Other chest pain   Primary Care at [facility], Arlie Solomons, MD

## 2019-07-08 ENCOUNTER — Telehealth: Payer: Self-pay | Admitting: Family Medicine

## 2019-07-08 NOTE — Telephone Encounter (Signed)
Pt had an insurance wellness check at her job and her glucose level was too high. She was advised to have her levels rechecked at this office and unsure if she needs an appt or just a nurse visit. Please advise.

## 2019-08-09 ENCOUNTER — Ambulatory Visit: Payer: BC Managed Care – PPO | Admitting: Family Medicine

## 2019-08-09 ENCOUNTER — Other Ambulatory Visit: Payer: Self-pay

## 2019-08-09 ENCOUNTER — Encounter: Payer: Self-pay | Admitting: Family Medicine

## 2019-08-09 VITALS — BP 113/79 | HR 92 | Temp 99.2°F | Resp 16 | Ht 64.0 in | Wt 154.8 lb

## 2019-08-09 DIAGNOSIS — Z5181 Encounter for therapeutic drug level monitoring: Secondary | ICD-10-CM | POA: Diagnosis not present

## 2019-08-09 DIAGNOSIS — Z1211 Encounter for screening for malignant neoplasm of colon: Secondary | ICD-10-CM | POA: Diagnosis not present

## 2019-08-09 DIAGNOSIS — Z23 Encounter for immunization: Secondary | ICD-10-CM

## 2019-08-09 DIAGNOSIS — E559 Vitamin D deficiency, unspecified: Secondary | ICD-10-CM

## 2019-08-09 DIAGNOSIS — R7309 Other abnormal glucose: Secondary | ICD-10-CM | POA: Diagnosis not present

## 2019-08-09 DIAGNOSIS — E789 Disorder of lipoprotein metabolism, unspecified: Secondary | ICD-10-CM

## 2019-08-09 DIAGNOSIS — R7301 Impaired fasting glucose: Secondary | ICD-10-CM | POA: Diagnosis not present

## 2019-08-09 DIAGNOSIS — Z114 Encounter for screening for human immunodeficiency virus [HIV]: Secondary | ICD-10-CM | POA: Diagnosis not present

## 2019-08-09 LAB — POCT GLYCOSYLATED HEMOGLOBIN (HGB A1C): Hemoglobin A1C: 8.2 % — AB (ref 4.0–5.6)

## 2019-08-09 MED ORDER — METFORMIN HCL ER 500 MG PO TB24: 500.0000 mg | ORAL_TABLET | Freq: Every day | ORAL | 3 refills | Status: DC

## 2019-08-09 NOTE — Patient Instructions (Addendum)
   If you have lab work done today you will be contacted with your lab results within the next 2 weeks.  If you have not heard from us then please contact us. The fastest way to get your results is to register for My Chart.   IF you received an x-ray today, you will receive an invoice from [facility]. Please contact [facility] at [phone] with questions or concerns regarding your invoice.   IF you received labwork today, you will receive an invoice from [facility]. Please contact [facility] at [phone] with questions or concerns regarding your invoice.   Our billing staff will not be able to assist you with questions regarding bills from these companies.  You will be contacted with the lab results as soon as they are available. The fastest way to get your results is to activate your My Chart account. Instructions are located on the last page of this paperwork. If you have not heard from us regarding the results in 2 weeks, please contact this office.     Diabetes Mellitus and Standards of Medical Care Managing diabetes (diabetes mellitus) can be complicated. Your diabetes treatment may be managed by a team of health care providers, including:  A physician who specializes in diabetes (endocrinologist).  A nurse practitioner or physician assistant.  Nurses.  A diet and nutrition specialist (registered dietitian).  A certified diabetes educator (CDE).  An exercise specialist.  A pharmacist.  An eye doctor.  A foot specialist (podiatrist).  A dentist.  A primary care provider.  A mental health provider. Your health care providers follow guidelines to help you get the best quality of care. The following schedule is a general guideline for your diabetes management plan. Your health care providers may give you more specific instructions. Physical exams Upon being diagnosed with diabetes mellitus, and each year after that, your health care provider will  ask about your medical and family history. He or she will also do a physical exam. Your exam may include:  Measuring your height, weight, and body mass index (BMI).  Checking your blood pressure. This will be done at every routine medical visit. Your target blood pressure may vary depending on your medical conditions, your age, and other factors.  Thyroid gland exam.  Skin exam.  Screening for damage to your nerves (peripheral neuropathy). This may include checking the pulse in your legs and feet and checking the level of sensation in your hands and feet.  A complete foot exam to inspect the structure and skin of your feet, including checking for cuts, bruises, redness, blisters, sores, or other problems.  Screening for blood vessel (vascular) problems, which may include checking the pulse in your legs and feet and checking your temperature. Blood tests Depending on your treatment plan and your personal needs, you may have the following tests done:  HbA1c (hemoglobin A1c). This test provides information about blood sugar (glucose) control over the previous 2-3 months. It is used to adjust your treatment plan, if needed. This test will be done: ? At least 2 times a year, if you are meeting your treatment goals. ? 4 times a year, if you are not meeting your treatment goals or if treatment goals have changed.  Lipid testing, including total, LDL, and HDL cholesterol and triglyceride levels. ? The goal for LDL is less than 100 mg/dL (5.5 mmol/L). If you are at high risk for complications, the goal is less than 70 mg/dL (3.9 mmol/L). ? The goal for   HDL is 40 mg/dL (2.2 mmol/L) or higher for men and 50 mg/dL (2.8 mmol/L) or higher for women. An HDL cholesterol of 60 mg/dL (3.3 mmol/L) or higher gives some protection against heart disease. ? The goal for triglycerides is less than 150 mg/dL (8.3 mmol/L).  Liver function tests.  Kidney function tests.  Thyroid function tests. Dental and eye  exams  Visit your dentist two times a year.  If you have type 1 diabetes, your health care provider may recommend an eye exam 3-5 years after you are diagnosed, and then once a year after your first exam. ? For children with type 1 diabetes, a health care provider may recommend an eye exam when your child is age 10 or older and has had diabetes for 3-5 years. After the first exam, your child should get an eye exam once a year.  If you have type 2 diabetes, your health care provider may recommend an eye exam as soon as you are diagnosed, and then once a year after your first exam. Immunizations   The yearly flu (influenza) vaccine is recommended for everyone 6 months or older who has diabetes.  The pneumonia (pneumococcal) vaccine is recommended for everyone 2 years or older who has diabetes. If you are 65 or older, you may get the pneumonia vaccine as a series of two separate shots.  The hepatitis B vaccine is recommended for adults shortly after being diagnosed with diabetes.  Adults and children with diabetes should receive all other vaccines according to age-specific recommendations from the Centers for Disease Control and Prevention (CDC). Mental and emotional health Screening for symptoms of eating disorders, anxiety, and depression is recommended at the time of diagnosis and afterward as needed. If your screening shows that you have symptoms (positive screening result), you may need more evaluation and you may work with a mental health care provider. Treatment plan Your treatment plan will be reviewed at every medical visit. You and your health care provider will discuss:  How you are taking your medicines, including insulin.  Any side effects you are experiencing.  Your blood glucose target goals.  The frequency of your blood glucose monitoring.  Lifestyle habits, such as activity level as well as tobacco, alcohol, and substance use. Diabetes self-management education Your  health care provider will assess how well you are monitoring your blood glucose levels and whether you are taking your insulin correctly. He or she may refer you to:  A certified diabetes educator to manage your diabetes throughout your life, starting at diagnosis.  A registered dietitian who can create or review your personal nutrition plan.  An exercise specialist who can discuss your activity level and exercise plan. Summary  Managing diabetes (diabetes mellitus) can be complicated. Your diabetes treatment may be managed by a team of health care providers.  Your health care providers follow guidelines in order to help you get the best quality of care.  Standards of care including having regular physical exams, blood tests, blood pressure monitoring, immunizations, screening tests, and education about how to manage your diabetes.  Your health care providers may also give you more specific instructions based on your individual health. This information is not intended to replace advice given to you by your health care provider. Make sure you discuss any questions you have with your health care provider. Document Released: 09/04/2009 Document Revised: 07/27/2018 Document Reviewed: 08/05/2016 Elsevier Patient Education  2020 Elsevier Inc.  

## 2019-08-09 NOTE — Progress Notes (Signed)
Established Patient Office Visit  Subjective:  Patient ID: Sandra Booker, female    DOB: [birth year]  Age: 48 y.o. MRN: [id number]  CC:  Chief Complaint  Patient presents with  . concerns about glucose levels and vitamin d  . Medication Refill    gabapentin and pantoprazole    HPI Sandra Booker presents for    Vitamin D deficiency Patient has not been taking Vitamin D supplement She denies any severe fatigue. Just feels like she gets tired at times.  She has some joint pains.    Working from home, she reports that she is working more for [employer]. She reports that she is sitting more and has more back and hip pains She tries to walk after work  [employer] from Last 3 Encounters:  08/09/19 154 lb 12.8 oz (70.2 kg)  08/07/18 160 lb 12.8 oz (72.9 kg)  06/25/18 160 lb 6 oz (72.7 kg)   She takes flexeril, gabapentin and protonix  Prediabetes She has a history of hyperglycemia She did a work physical and her fasting glucose was 126 mg/dl She denies history of diabetes No dysuria, no polydispia, polyphagia Lab Results  Component Value Date   HGBA1C 8.2 (A) 08/09/2019   She has a history of diabetes in her SISTER   Colon Cancer Screening she has never had a colonoscopy she denies blood in his stool, unexpected weight loss or pain with defecation No rectal itching she does not smoke she does not have a family history of colon cancer   Past Medical History:  Diagnosis Date  . Abnormal Pap smear   . Allergy   . Anemia   . Anxiety   . Arthritis   . Cyst of breast    right breast  . Encounter for insertion of mirena IUD 09/27/2010  . Fibroid   . GERD (gastroesophageal reflux disease)   . H/O cervicitis   . H/O menorrhagia   . History of allergic rhinitis   . History of iron deficiency   . History of vitamin D deficiency   . Hypoglycemia   . IBS (irritable bowel syndrome)   . Pelvic pain in female    h/o  . UTI (urinary tract infection)    h/o    Past Surgical History:  Procedure Laterality Date  . ANKLE SURGERY  06/2011  . DILATION AND CURETTAGE OF UTERUS  09/29/2005  . HERNIA REPAIR    . HYSTEROSCOPY  09/29/2005  . MYOMECTOMY      Family History  Problem Relation Age of Onset  . Breast cancer Mother   . Hypertension Mother   . Cancer Mother 74       breast  . Hypertension Father   . Hyperlipidemia Father   . Cancer Maternal Grandfather 16       Prostate    Social History   Socioeconomic History  . Marital status: Married    Spouse name: Will  . Number of children: 2  . Years of education: 12+  . Highest education level: Not on file  Occupational History  . Occupation: TITLE Armed forces operational officer: [employer]  Social Needs  . Financial resource strain: Not on file  . Food insecurity    Worry: Not on file    Inability: Not on file  . Transportation needs    Medical: Not on file    Non-medical: Not on file  Tobacco Use  . Smoking status: Never Smoker  . Smokeless  tobacco: Never Used  Substance and Sexual Activity  . Alcohol use: No  . Drug use: No  . Sexual activity: Yes    Birth control/protection: I.U.D.  Lifestyle  . Physical activity    Days per week: Not on file    Minutes per session: Not on file  . Stress: Not on file  Relationships  . Social Herbalist on phone: Not on file    Gets together: Not on file    Attends religious service: Not on file    Active member of club or organization: Not on file    Attends meetings of clubs or organizations: Not on file    Relationship status: Not on file  . Intimate partner violence    Fear of current or ex partner: Not on file    Emotionally abused: Not on file    Physically abused: Not on file    Forced sexual activity: Not on file  Other Topics Concern  . Not on file  Social History Narrative   Lives with her husband and their 2 children, when they are not at school.   She works as an Barista.   Highest level of education:  some college    Outpatient Medications Prior to Visit  Medication Sig Dispense Refill  . ALPRAZolam (XANAX) 0.25 MG tablet Take 1 tablet (0.25 mg total) by mouth at bedtime as needed for anxiety. 30 tablet 0  . cyclobenzaprine (FLEXERIL) 5 MG tablet TAKE 1 TO 2 TABLETS BY MOUTH AT BEDTIME FOR BACK PAIN OR SPASMS 60 tablet 5  . gabapentin (NEURONTIN) 600 MG tablet Take 1 tablet (600 mg total) by mouth 2 (two) times daily. 60 tablet 3  . hyoscyamine (LEVSIN, ANASPAZ) 0.125 MG tablet TAKE 1 TABLET BY MOUTH EVERY 6 HOURS AS NEEDED FOR CRAMPING 30 tablet 5  . levonorgestrel (MIRENA) 20 MCG/24HR IUD 1 each by Intrauterine route once.    . meloxicam (MOBIC) 7.5 MG tablet Take 1 tablet (7.5 mg total) by mouth daily. TAKE 1 TABLET BY MOUTH DAILY AS NEEDED FOR PAIN. TAKE WITH FOOD. NO OTHER NSAIDS 30 tablet 2  . Multiple Vitamin (MULTIVITAMIN) tablet Take 1 tablet by mouth daily.    . nortriptyline (PAMELOR) 10 MG capsule Start nortriptyline 10mg  at bedtime for 2 week, then increase to 2 tablet at bedtime 60 capsule 3  . pantoprazole (PROTONIX) 40 MG tablet Take 1 tablet (40 mg total) by mouth daily. 90 tablet 3  . Vitamin D, Ergocalciferol, (DRISDOL) 50000 units CAPS capsule Take 1 capsule (50,000 Units total) by mouth every 7 (seven) days. 12 capsule 3   No facility-administered medications prior to visit.     Allergies  Allergen Reactions  . Other     ROS Review of Systems Review of Systems  Constitutional: Negative for activity change, appetite change, chills and fever.  HENT: Negative for congestion, nosebleeds, trouble swallowing and voice change.   Respiratory: Negative for cough, shortness of breath and wheezing.   Gastrointestinal: Negative for diarrhea, nausea and vomiting.  Genitourinary: Negative for difficulty urinating, dysuria, flank pain and hematuria.  Musculoskeletal: Negative for back pain, joint swelling and neck pain.  Neurological: Negative for dizziness, speech  difficulty, light-headedness and numbness.  See HPI. All other review of systems negative.     Objective:    Physical Exam  BP 113/79 (BP Location: Right Arm, Patient Position: Sitting, Cuff Size: Normal)   Pulse 92   Temp 99.2 F (37.3 C) (Oral)  Resp 16   Ht 5\' 4"  (1.626 m)   Wt 154 lb 12.8 oz (70.2 kg)   SpO2 98%   BMI 26.57 kg/m  Wt Readings from Last 3 Encounters:  08/09/19 154 lb 12.8 oz (70.2 kg)  08/07/18 160 lb 12.8 oz (72.9 kg)  06/25/18 160 lb 6 oz (72.7 kg)   Physical Exam  Constitutional: Oriented to person, place, and time. Appears well-developed and well-nourished.  HENT:  Head: Normocephalic and atraumatic.  Eyes: Conjunctivae and EOM are normal.  Cardiovascular: Normal rate, regular rhythm, normal heart sounds and intact distal pulses.  No murmur heard. Pulmonary/Chest: Effort normal and breath sounds normal. No stridor. No respiratory distress. Has no wheezes.  Neurological: Is alert and oriented to person, place, and time.  Skin: Skin is warm. Capillary refill takes less than 2 seconds.  Psychiatric: Has a normal mood and affect. Behavior is normal. Judgment and thought content normal.    Health Maintenance Due  Topic Date Due  . URINE MICROALBUMIN  12/16/1978  . COLONOSCOPY  12/16/2018    There are no preventive care reminders to display for this patient.  Lab Results  Component Value Date   TSH 0.58 09/30/2016   Lab Results  Component Value Date   WBC 8.3 09/30/2016   HGB 14.1 09/30/2016   HCT 42.5 09/30/2016   MCV 90.0 09/30/2016   PLT 267 09/30/2016   Lab Results  Component Value Date   NA 137 08/09/2019   K 4.9 08/09/2019   CO2 22 08/09/2019   GLUCOSE 127 (H) 08/09/2019   BUN 8 08/09/2019   CREATININE 0.70 08/09/2019   BILITOT 0.3 08/09/2019   ALKPHOS 79 08/09/2019   AST 16 08/09/2019   ALT 12 08/09/2019   PROT 7.3 08/09/2019   ALBUMIN 4.7 08/09/2019   CALCIUM 10.4 (H) 08/09/2019   Lab Results  Component Value Date    CHOL 219 (H) 08/09/2019   Lab Results  Component Value Date   HDL 45 08/09/2019   Lab Results  Component Value Date   LDLCALC 128 (H) 08/07/2018   Lab Results  Component Value Date   TRIG 107 08/09/2019   Lab Results  Component Value Date   CHOLHDL 4.9 (H) 08/09/2019   Lab Results  Component Value Date   HGBA1C 8.2 (A) 08/09/2019      Assessment & Plan:   Problem List Items Addressed This Visit       Other Visit Diagnoses    Impaired fasting glucose    -  Primary Based on patient's blood glucose she has the diagnosis of diabetes Will start metformin Will do her newly diagnosed diabetes visit at the next visit for education and follow up    Relevant Orders   POCT glycosylated hemoglobin (Hb A1C) (Completed)   Borderline high cholesterol       Relevant Orders   Lipid panel (Completed)   Screening for colon cancer    - -discussed options for colon cancer screenings, reviewed family history, discussed options for testing.  -pt decided to proceed with colonoscopy    Relevant Orders   Ambulatory referral to Gastroenterology   Screening for HIV (human immunodeficiency virus)       Relevant Orders   HIV antibody (with reflex) (Completed)   Need for prophylactic vaccination and inoculation against influenza       Relevant Orders   Flu Vaccine QUAD 36+ mos IM (Completed)   Elevated glucose       Relevant Orders  POCT glycosylated hemoglobin (Hb A1C) (Completed)   Vitamin D deficiency    - will supplement with Vitamin D3 50,000    Relevant Orders   Vitamin D, 25-hydroxy (Completed)       Meds ordered this encounter  Medications  . metFORMIN (GLUCOPHAGE-XR) 500 MG 24 hr tablet    Sig: Take 1 tablet (500 mg total) by mouth daily with breakfast.    Dispense:  30 tablet    Refill:  3    Follow-up: Return in about 4 weeks (around 09/06/2019) for diabetes.   A total of 40 minutes were spent face-to-face with the patient during this encounter and over half of that  time was spent on counseling and coordination of care.   Forrest Moron, MD

## 2019-08-10 LAB — LIPID PANEL
Chol/HDL Ratio: 4.9 ratio — ABNORMAL HIGH (ref 0.0–4.4)
Cholesterol, Total: 219 mg/dL — ABNORMAL HIGH (ref 100–199)
HDL: 45 mg/dL (ref >39)
LDL Chol Calc (NIH): 155 mg/dL — ABNORMAL HIGH (ref 0–99)
Triglycerides: 107 mg/dL (ref 0–149)
VLDL Cholesterol Cal: 19 mg/dL (ref 5–40)

## 2019-08-10 LAB — COMPREHENSIVE METABOLIC PANEL WITH GFR
ALT: 12 IU/L (ref 0–32)
AST: 16 IU/L (ref 0–40)
Albumin/Globulin Ratio: 1.8 (ref 1.2–2.2)
Albumin: 4.7 g/dL (ref 3.8–4.8)
Alkaline Phosphatase: 79 IU/L (ref 39–117)
BUN/Creatinine Ratio: 11 (ref 9–23)
BUN: 8 mg/dL (ref 6–24)
Bilirubin Total: 0.3 mg/dL (ref 0.0–1.2)
CO2: 22 mmol/L (ref 20–29)
Calcium: 10.4 mg/dL — ABNORMAL HIGH (ref 8.7–10.2)
Chloride: 99 mmol/L (ref 96–106)
Creatinine, Ser: 0.7 mg/dL (ref 0.57–1.00)
GFR calc Af Amer: 117 mL/min/1.73
GFR calc non Af Amer: 101 mL/min/1.73
Globulin, Total: 2.6 g/dL (ref 1.5–4.5)
Glucose: 127 mg/dL — ABNORMAL HIGH (ref 65–99)
Potassium: 4.9 mmol/L (ref 3.5–5.2)
Sodium: 137 mmol/L (ref 134–144)
Total Protein: 7.3 g/dL (ref 6.0–8.5)

## 2019-08-10 LAB — VITAMIN D 25 HYDROXY (VIT D DEFICIENCY, FRACTURES): Vit D, 25-Hydroxy: 15.7 ng/mL — ABNORMAL LOW (ref 30.0–100.0)

## 2019-08-10 LAB — HIV ANTIBODY (ROUTINE TESTING W REFLEX): HIV Screen 4th Generation wRfx: NONREACTIVE

## 2019-08-11 MED ORDER — PRAVASTATIN SODIUM 10 MG PO TABS: 10.0000 mg | ORAL_TABLET | Freq: Every day | ORAL | 1 refills | Status: DC

## 2019-08-11 MED ORDER — VITAMIN D (ERGOCALCIFEROL) 1.25 MG (50000 UNIT) PO CAPS: 50000.0000 [IU] | ORAL_CAPSULE | ORAL | 3 refills | Status: DC

## 2019-08-13 ENCOUNTER — Encounter: Payer: Self-pay | Admitting: Internal Medicine

## 2019-08-15 ENCOUNTER — Other Ambulatory Visit: Payer: Self-pay | Admitting: Family Medicine

## 2019-08-29 ENCOUNTER — Other Ambulatory Visit: Payer: Self-pay

## 2019-08-29 ENCOUNTER — Encounter: Payer: Self-pay | Admitting: Internal Medicine

## 2019-08-29 ENCOUNTER — Ambulatory Visit (AMBULATORY_SURGERY_CENTER): Payer: Self-pay | Admitting: *Deleted

## 2019-08-29 VITALS — Temp 97.5°F | Ht 64.0 in | Wt 153.0 lb

## 2019-08-29 DIAGNOSIS — Z1211 Encounter for screening for malignant neoplasm of colon: Secondary | ICD-10-CM

## 2019-08-29 MED ORDER — SUPREP BOWEL PREP KIT 17.5-3.13-1.6 GM/177ML PO SOLN: 1.0000 | Freq: Once | ORAL | 0 refills | Status: AC

## 2019-08-29 NOTE — Progress Notes (Signed)
No egg or soy allergy known to patient  No issues with past sedation with any surgeries  or procedures, no intubation problems  No diet pills per patient No home 02 use per patient  No blood thinners per patient  Pt denies issues with constipation  No A fib or A flutter  EMMI video sent to pt's e mail   Due to the COVID-19 pandemic we are asking patients to follow these guidelines. Please only bring one care partner. Please be aware that your care partner may wait in the car in the parking lot or if they feel like they will be too hot to wait in the car, they may wait in the lobby on the 4th floor. All care partners are required to wear a mask the entire time (we do not have any that we can provide them), they need to practice social distancing, and we will do a Covid check for all patient's and care partners when you arrive. Also we will check their temperature and your temperature. If the care partner waits in their car they need to stay in the parking lot the entire time and we will call them on their cell phone when the patient is ready for discharge so they can bring the car to the front of the building. Also all patient's will need to wear a mask into building.  suprep $15 coupon to pt

## 2019-09-04 ENCOUNTER — Other Ambulatory Visit: Payer: Self-pay

## 2019-09-04 DIAGNOSIS — Z20828 Contact with and (suspected) exposure to other viral communicable diseases: Secondary | ICD-10-CM | POA: Diagnosis not present

## 2019-09-04 DIAGNOSIS — Z20822 Contact with and (suspected) exposure to covid-19: Secondary | ICD-10-CM

## 2019-09-05 LAB — NOVEL CORONAVIRUS, NAA: SARS-CoV-2, NAA: NOT DETECTED

## 2019-09-08 NOTE — Progress Notes (Signed)
Established Patient Office Visit  Subjective:  Patient ID: Sandra Booker, female    DOB: 1971/05/11  Age: 48 y.o. MRN: [id number]  CC: No chief complaint on file.   HPI Sandra Booker presents for   Patient has a history of diabetes mellitus type 2 that was diagnosed by hemoglobin a1c of 8.2%   She reports that she went to work on her weight and her lifestyle and is cutting out sugars, sodas and is exercising daily.  She is reading up on diabetes and checking food labels. She denies dry mouth, no polyuria, polydipsia, polyphagia She is taking the metformin and is working on reversing the diabetes. Lab Results  Component Value Date   HGBA1C 8.2 (A) 08/09/2019   Wt Readings from Last 3 Encounters:  09/09/19 150 lb (68 kg)  08/29/19 153 lb (69.4 kg)  08/09/19 154 lb 12.8 oz (70.2 kg)        Past Medical History:  Diagnosis Date  . Abnormal Pap smear   . Allergy   . Anemia   . Anxiety   . Arthritis   . Cyst of breast    right breast  . Encounter for insertion of mirena IUD 09/27/2010  . Fibroid   . GERD (gastroesophageal reflux disease)   . H/O cervicitis   . H/O menorrhagia   . History of allergic rhinitis   . History of iron deficiency   . History of vitamin D deficiency   . Hyperglycemia    on metformin but pt states no MD states DM  . Hyperlipidemia   . Hypoglycemia   . IBS (irritable bowel syndrome)   . Neuromuscular disorder ([place])    nerve damage in right ankle   . Pelvic pain in female    h/o  . Seizures ([place])    x 1 30 yrs ago with pregnancy - toxemia - none since   . UTI (urinary tract infection)    h/o    Past Surgical History:  Procedure Laterality Date  . ANKLE SURGERY  06/2011  . DILATION AND CURETTAGE OF UTERUS  09/29/2005  . HERNIA REPAIR    . HYSTEROSCOPY  09/29/2005  . MYOMECTOMY      Family History  Problem Relation Age of Onset  . Breast cancer Mother   . Hypertension Mother   . Cancer Mother 59       breast  .  Hypertension Father   . Hyperlipidemia Father   . Cancer Maternal Grandfather 84       Prostate  . Colon cancer Neg Hx   . Colon polyps Neg Hx   . Esophageal cancer Neg Hx   . Rectal cancer Neg Hx   . Stomach cancer Neg Hx     Social History   Socioeconomic History  . Marital status: Married    Spouse name: Will  . Number of children: 2  . Years of education: 12+  . Highest education level: Not on file  Occupational History  . Occupation: TITLE Armed forces operational officer: [employer]  Social Needs  . Financial resource strain: Not on file  . Food insecurity    Worry: Not on file    Inability: Not on file  . Transportation needs    Medical: Not on file    Non-medical: Not on file  Tobacco Use  . Smoking status: Never Smoker  . Smokeless tobacco: Never Used  Substance and Sexual Activity  . Alcohol use: No  .  Drug use: No  . Sexual activity: Yes    Birth control/protection: I.U.D.  Lifestyle  . Physical activity    Days per week: Not on file    Minutes per session: Not on file  . Stress: Not on file  Relationships  . Social Herbalist on phone: Not on file    Gets together: Not on file    Attends religious service: Not on file    Active member of club or organization: Not on file    Attends meetings of clubs or organizations: Not on file    Relationship status: Not on file  . Intimate partner violence    Fear of current or ex partner: Not on file    Emotionally abused: Not on file    Physically abused: Not on file    Forced sexual activity: Not on file  Other Topics Concern  . Not on file  Social History Narrative   Lives with her husband and their 2 children, when they are not at school.   She works as an Barista.   Highest level of education: some college    Outpatient Medications Prior to Visit  Medication Sig Dispense Refill  . acetaminophen (TYLENOL) 500 MG tablet Take 500 mg by mouth every 6 (six) hours as needed.    . ALPRAZolam  (XANAX) 0.25 MG tablet Take 1 tablet (0.25 mg total) by mouth at bedtime as needed for anxiety. (Patient not taking: Reported on 08/29/2019) 30 tablet 0  . cyclobenzaprine (FLEXERIL) 5 MG tablet TAKE 1 TO 2 TABLETS BY MOUTH AT BEDTIME FOR BACK PAIN OR SPASMS 60 tablet 5  . diphenhydrAMINE (BENADRYL ALLERGY) 25 mg capsule Take 25 mg by mouth every 8 (eight) hours as needed.    . gabapentin (NEURONTIN) 600 MG tablet Take 1 tablet (600 mg total) by mouth 2 (two) times daily. (Patient not taking: Reported on 08/29/2019) 60 tablet 3  . hyoscyamine (LEVSIN, ANASPAZ) 0.125 MG tablet TAKE 1 TABLET BY MOUTH EVERY 6 HOURS AS NEEDED FOR CRAMPING 30 tablet 5  . levonorgestrel (MIRENA) 20 MCG/24HR IUD 1 each by Intrauterine route once.    . meloxicam (MOBIC) 7.5 MG tablet Take 1 tablet (7.5 mg total) by mouth daily. TAKE 1 TABLET BY MOUTH DAILY AS NEEDED FOR PAIN. TAKE WITH FOOD. NO OTHER NSAIDS 30 tablet 2  . metFORMIN (GLUCOPHAGE-XR) 500 MG 24 hr tablet Take 1 tablet (500 mg total) by mouth daily with breakfast. 30 tablet 3  . Multiple Vitamin (MULTIVITAMIN) tablet Take 1 tablet by mouth daily.    . nortriptyline (PAMELOR) 10 MG capsule Start nortriptyline 10mg  at bedtime for 2 week, then increase to 2 tablet at bedtime 60 capsule 3  . pantoprazole (PROTONIX) 40 MG tablet TAKE 1 TABLET(40 MG) BY MOUTH DAILY 90 tablet 3  . pravastatin (PRAVACHOL) 10 MG tablet Take 1 tablet (10 mg total) by mouth daily. 90 tablet 1  . Vitamin D, Ergocalciferol, (DRISDOL) 1.25 MG (50000 UT) CAPS capsule Take 1 capsule (50,000 Units total) by mouth every 7 (seven) days. 12 capsule 3   No facility-administered medications prior to visit.     No Known Allergies  ROS Review of Systems  Review of Systems  Constitutional: Negative for activity change, appetite change, chills and fever.  HENT: Negative for congestion, nosebleeds, trouble swallowing and voice change.   Respiratory: Negative for cough, shortness of breath and  wheezing.   Gastrointestinal: Negative for diarrhea, nausea and vomiting.  Genitourinary: Negative  for difficulty urinating, dysuria, flank pain and hematuria.  Musculoskeletal: Negative for back pain, joint swelling and neck pain.  Neurological: Negative for dizziness, speech difficulty, light-headedness and numbness.  See HPI. All other review of systems negative.     Objective:    Physical Exam  There were no vitals taken for this visit. Wt Readings from Last 3 Encounters:  08/29/19 153 lb (69.4 kg)  08/09/19 154 lb 12.8 oz (70.2 kg)  08/07/18 160 lb 12.8 oz (72.9 kg)   Physical Exam  Constitutional: Oriented to person, place, and time. Appears well-developed and well-nourished.  HENT:  Head: Normocephalic and atraumatic.  Eyes: Conjunctivae and EOM are normal.  Cardiovascular: Normal rate, regular rhythm, normal heart sounds and intact distal pulses.  No murmur heard. Pulmonary/Chest: Effort normal and breath sounds normal. No stridor. No respiratory distress. Has no wheezes.  Neurological: Is alert and oriented to person, place, and time.  Skin: Skin is warm. Capillary refill takes less than 2 seconds.  Psychiatric: Has a normal mood and affect. Behavior is normal. Judgment and thought content normal.    Health Maintenance Due  Topic Date Due  . COLONOSCOPY  12/16/2018    There are no preventive care reminders to display for this patient.  Lab Results  Component Value Date   TSH 0.58 09/30/2016   Lab Results  Component Value Date   WBC 8.3 09/30/2016   HGB 14.1 09/30/2016   HCT 42.5 09/30/2016   MCV 90.0 09/30/2016   PLT 267 09/30/2016   Lab Results  Component Value Date   NA 137 08/09/2019   K 4.9 08/09/2019   CO2 22 08/09/2019   GLUCOSE 127 (H) 08/09/2019   BUN 8 08/09/2019   CREATININE 0.70 08/09/2019   BILITOT 0.3 08/09/2019   ALKPHOS 79 08/09/2019   AST 16 08/09/2019   ALT 12 08/09/2019   PROT 7.3 08/09/2019   ALBUMIN 4.7 08/09/2019   CALCIUM  10.4 (H) 08/09/2019   Lab Results  Component Value Date   CHOL 219 (H) 08/09/2019   Lab Results  Component Value Date   HDL 45 08/09/2019   Lab Results  Component Value Date   LDLCALC 155 (H) 08/09/2019   Lab Results  Component Value Date   TRIG 107 08/09/2019   Lab Results  Component Value Date   CHOLHDL 4.9 (H) 08/09/2019   Lab Results  Component Value Date   HGBA1C 8.2 (A) 08/09/2019      Assessment & Plan:   Problem List Items Addressed This Visit    None    Visit Diagnoses    Newly diagnosed diabetes ([place])    -  Primary     Patient diagnosed in September based on A1C who had dramatically changed her eating and lifestyle Today her a1c is showing very good improvement from 8.2% to 7.2% She received her pneumonia vaccine today based on diabetes standards of care No orders of the defined types were placed in this encounter.   Follow-up: No follow-ups on file.    Forrest Moron, MD

## 2019-09-09 ENCOUNTER — Encounter: Payer: Self-pay | Admitting: Family Medicine

## 2019-09-09 ENCOUNTER — Ambulatory Visit (INDEPENDENT_AMBULATORY_CARE_PROVIDER_SITE_OTHER): Payer: BC Managed Care – PPO | Admitting: Family Medicine

## 2019-09-09 ENCOUNTER — Other Ambulatory Visit: Payer: Self-pay

## 2019-09-09 VITALS — BP 105/67 | HR 76 | Temp 98.8°F | Ht 64.0 in | Wt 150.0 lb

## 2019-09-09 DIAGNOSIS — E119 Type 2 diabetes mellitus without complications: Secondary | ICD-10-CM

## 2019-09-09 DIAGNOSIS — Z23 Encounter for immunization: Secondary | ICD-10-CM | POA: Diagnosis not present

## 2019-09-09 LAB — POCT GLYCOSYLATED HEMOGLOBIN (HGB A1C): Hemoglobin A1C: 7.2 % — AB (ref 4.0–5.6)

## 2019-09-09 NOTE — Patient Instructions (Signed)
° ° ° °  If you have lab work done today you will be contacted with your lab results within the next 2 weeks.  If you have not heard from us then please contact us. The fastest way to get your results is to register for My Chart. ° ° °IF you received an x-ray today, you will receive an invoice from [facility]. Please contact [facility] at [phone] with questions or concerns regarding your invoice.  ° °IF you received labwork today, you will receive an invoice from [facility]. Please contact [facility] at [phone] with questions or concerns regarding your invoice.  ° °Our billing staff will not be able to assist you with questions regarding bills from these companies. ° °You will be contacted with the lab results as soon as they are available. The fastest way to get your results is to activate your My Chart account. Instructions are located on the last page of this paperwork. If you have not heard from us regarding the results in 2 weeks, please contact this office. °  ° ° ° °

## 2019-09-10 LAB — MICROALBUMIN, URINE: Microalbumin, Urine: 8.1 ug/mL

## 2019-09-12 ENCOUNTER — Telehealth: Payer: Self-pay

## 2019-09-12 NOTE — Telephone Encounter (Signed)
Covid-19 screening questions   Do you now or have you had a fever in the last 14 days?  Do you have any respiratory symptoms of shortness of breath or cough now or in the last 14 days?  Do you have any family members or close contacts with diagnosed or suspected Covid-19 in the past 14 days?  Have you been tested for Covid-19 and found to be positive?       

## 2019-09-12 NOTE — Telephone Encounter (Signed)
Pt responded "no" to all screening questions °

## 2019-09-13 ENCOUNTER — Other Ambulatory Visit: Payer: Self-pay

## 2019-09-13 ENCOUNTER — Ambulatory Visit (AMBULATORY_SURGERY_CENTER): Payer: BC Managed Care – PPO | Admitting: Internal Medicine

## 2019-09-13 ENCOUNTER — Encounter: Payer: Self-pay | Admitting: Internal Medicine

## 2019-09-13 VITALS — BP 112/74 | HR 74 | Temp 98.8°F | Resp 16 | Ht 64.0 in | Wt 153.0 lb

## 2019-09-13 DIAGNOSIS — Z1211 Encounter for screening for malignant neoplasm of colon: Secondary | ICD-10-CM | POA: Diagnosis not present

## 2019-09-13 DIAGNOSIS — D123 Benign neoplasm of transverse colon: Secondary | ICD-10-CM | POA: Diagnosis not present

## 2019-09-13 DIAGNOSIS — D122 Benign neoplasm of ascending colon: Secondary | ICD-10-CM

## 2019-09-13 MED ORDER — SODIUM CHLORIDE 0.9 % IV SOLN: 500.0000 mL | Freq: Once | INTRAVENOUS | Status: DC

## 2019-09-13 NOTE — Op Note (Signed)
[facility] Patient Name: Sandra Booker Procedure Date: 09/13/2019 8:42 AM MRN: [id number] Endoscopist: Docia Chuck. Henrene Pastor , MD Age: 48 Referring MD:  Date of Birth: 16-Jun-1971 Gender: Female Account #: [id number] Procedure:                Colonoscopy with cold snare polypectomy x 2 Indications:              Screening for colorectal malignant neoplasm Medicines:                Monitored Anesthesia Care Procedure:                Pre-Anesthesia Assessment:                           - Prior to the procedure, a History and Physical                            was performed, and patient medications and                            allergies were reviewed. The patient's tolerance of                            previous anesthesia was also reviewed. The risks                            and benefits of the procedure and the sedation                            options and risks were discussed with the patient.                            All questions were answered, and informed consent                            was obtained. Prior Anticoagulants: The patient has                            taken no previous anticoagulant or antiplatelet                            agents. ASA Grade Assessment: II - A patient with                            mild systemic disease. After reviewing the risks                            and benefits, the patient was deemed in                            satisfactory condition to undergo the procedure.                           After obtaining informed consent, the colonoscope  was passed under direct vision. Throughout the                            procedure, the patient's blood pressure, pulse, and                            oxygen saturations were monitored continuously. The                            Colonoscope was introduced through the anus and                            advanced to the the cecum, identified by        appendiceal orifice and ileocecal valve. The                            ileocecal valve, appendiceal orifice, and rectum                            were photographed. The quality of the bowel                            preparation was excellent. The colonoscopy was                            performed without difficulty. The patient tolerated                            the procedure well. The bowel preparation used was                            SUPREP via split dose instruction. Scope In: 8:59:05 AM Scope Out: 9:13:19 AM Scope Withdrawal Time: 0 hours 11 minutes 45 seconds  Total Procedure Duration: 0 hours 14 minutes 14 seconds  Findings:                 Two polyps were found in the transverse colon and                            ascending colon. The polyps were 1 to 3 mm in size.                            These polyps were removed with a cold snare.                            Resection and retrieval were complete.                           Internal hemorrhoids were found during                            retroflexion. The hemorrhoids were small.  The exam was otherwise without abnormality on                            direct and retroflexion views. Complications:            No immediate complications. Estimated blood loss:                            None. Estimated Blood Loss:     Estimated blood loss: none. Impression:               - Two 1 to 3 mm polyps in the transverse colon and                            in the ascending colon, removed with a cold snare.                            Resected and retrieved.                           - Internal hemorrhoids.                           - The examination was otherwise normal on direct                            and retroflexion views. Recommendation:           - Repeat colonoscopy in 7-10 years for surveillance.                           - Patient has a contact number available for                             emergencies. The signs and symptoms of potential                            delayed complications were discussed with the                            patient. Return to normal activities tomorrow.                            Written discharge instructions were provided to the                            patient.                           - Resume previous diet.                           - Continue present medications.                           - Await pathology results. Docia Chuck. Henrene Pastor, MD 09/13/2019 9:18:36 AM This report has been signed electronically.

## 2019-09-13 NOTE — Progress Notes (Signed)
PT taken to PACU. Monitors in place. VSS. Report given to RN. 

## 2019-09-13 NOTE — Patient Instructions (Signed)
HANDOUTS PROVIDED ON: POLYPS & HEMORRHOIDS  THE POLYPS REMOVED TODAY HAVE BEEN SENT FOR PATHOLOGY.  THE RESULTS WILL TAKE 2-3 WEEKS TO RECEIVE.  YOUR NEXT COLONOSCOPY WILL BE IN 7-10 YEARS  DEPENDING ON THE PATHOLOGY RESULTS.  YOU MAY RESUME YOUR PREVIOUS DIET AND MEDICATION SCHEDULE.  [place] YOU FOR ALLOWING [name] TO CARE FOR YOU TODAY!!  YOU HAD AN ENDOSCOPIC PROCEDURE TODAY AT [facility]:   Refer to the procedure report that was given to you for any specific questions about what was found during the examination.  If the procedure report does not answer your questions, please call your gastroenterologist to clarify.  If you requested that your care partner not be given the details of your procedure findings, then the procedure report has been included in a sealed envelope for you to review at your convenience later.  YOU SHOULD EXPECT: Some feelings of bloating in the abdomen. Passage of more gas than usual.  Walking can help get rid of the air that was put into your GI tract during the procedure and reduce the bloating. If you had a lower endoscopy (such as a colonoscopy or flexible sigmoidoscopy) you may notice spotting of blood in your stool or on the toilet paper. If you underwent a bowel prep for your procedure, you may not have a normal bowel movement for a few days.  Please Note:  You might notice some irritation and congestion in your nose or some drainage.  This is from the oxygen used during your procedure.  There is no need for concern and it should clear up in a day or so.  SYMPTOMS TO REPORT IMMEDIATELY:   Following lower endoscopy (colonoscopy or flexible sigmoidoscopy):  Excessive amounts of blood in the stool  Significant tenderness or worsening of abdominal pains  Swelling of the abdomen that is new, acute  Fever of 100F or higher  For urgent or emergent issues, a gastroenterologist can be reached at any hour by calling [phone].   DIET:  We do  recommend a small meal at first, but then you may proceed to your regular diet.  Drink plenty of fluids but you should avoid alcoholic beverages for 24 hours.  ACTIVITY:  You should plan to take it easy for the rest of today and you should NOT DRIVE or use heavy machinery until tomorrow (because of the sedation medicines used during the test).    FOLLOW UP: Our staff will call the number listed on your records 48-72 hours following your procedure to check on you and address any questions or concerns that you may have regarding the information given to you following your procedure. If we do not reach you, we will leave a message.  We will attempt to reach you two times.  During this call, we will ask if you have developed any symptoms of COVID 19. If you develop any symptoms (ie: fever, flu-like symptoms, shortness of breath, cough etc.) before then, please call [phone].  If you test positive for Covid 19 in the 2 weeks post procedure, please call and report this information to [name].    If any biopsies were taken you will be contacted by phone or by letter within the next 1-3 weeks.  Please call us at [phone] if you have not heard about the biopsies in 3 weeks.    SIGNATURES/CONFIDENTIALITY: You and/or your care partner have signed paperwork which will be entered into your electronic medical record.  These signatures attest to  the fact that that the information above on your After Visit Summary has been reviewed and is understood.  Full responsibility of the confidentiality of this discharge information lies with you and/or your care-partner.

## 2019-09-13 NOTE — Progress Notes (Signed)
Called to room to assist during endoscopic procedure.  Patient ID and intended procedure confirmed with present staff. Received instructions for my participation in the procedure from the performing physician.  

## 2019-09-13 NOTE — Progress Notes (Signed)
Pt's states no medical or surgical changes since previsit or office visit. 

## 2019-09-16 ENCOUNTER — Other Ambulatory Visit (INDEPENDENT_AMBULATORY_CARE_PROVIDER_SITE_OTHER): Payer: BC Managed Care – PPO

## 2019-09-16 ENCOUNTER — Telehealth: Payer: Self-pay | Admitting: Internal Medicine

## 2019-09-16 ENCOUNTER — Encounter: Payer: Self-pay | Admitting: Internal Medicine

## 2019-09-16 ENCOUNTER — Ambulatory Visit (INDEPENDENT_AMBULATORY_CARE_PROVIDER_SITE_OTHER): Payer: BC Managed Care – PPO | Admitting: Internal Medicine

## 2019-09-16 ENCOUNTER — Other Ambulatory Visit: Payer: Self-pay

## 2019-09-16 VITALS — BP 110/70 | HR 96 | Temp 98.3°F | Ht 64.0 in | Wt 153.0 lb

## 2019-09-16 DIAGNOSIS — D123 Benign neoplasm of transverse colon: Secondary | ICD-10-CM

## 2019-09-16 DIAGNOSIS — R109 Unspecified abdominal pain: Secondary | ICD-10-CM

## 2019-09-16 LAB — CBC WITH DIFFERENTIAL/PLATELET
Basophils Absolute: 0 10*3/uL (ref 0.0–0.1)
Basophils Relative: 0.8 % (ref 0.0–3.0)
Eosinophils Absolute: 0.1 10*3/uL (ref 0.0–0.7)
Eosinophils Relative: 1.7 % (ref 0.0–5.0)
HCT: 37.7 % (ref 36.0–46.0)
Hemoglobin: 12.7 g/dL (ref 12.0–15.0)
Lymphocytes Relative: 41.1 % (ref 12.0–46.0)
Lymphs Abs: 2.6 10*3/uL (ref 0.7–4.0)
MCHC: 33.6 g/dL (ref 30.0–36.0)
MCV: 90.1 fl (ref 78.0–100.0)
Monocytes Absolute: 0.8 10*3/uL (ref 0.1–1.0)
Monocytes Relative: 12.9 % — ABNORMAL HIGH (ref 3.0–12.0)
Neutro Abs: 2.7 10*3/uL (ref 1.4–7.7)
Neutrophils Relative %: 43.5 % (ref 43.0–77.0)
Platelets: 239 10*3/uL (ref 150.0–400.0)
RBC: 4.19 Mil/uL (ref 3.87–5.11)
RDW: 12.7 % (ref 11.5–15.5)
WBC: 6.2 10*3/uL (ref 4.0–10.5)

## 2019-09-16 LAB — URINALYSIS
Bilirubin Urine: NEGATIVE
Ketones, ur: NEGATIVE
Leukocytes,Ua: NEGATIVE
Nitrite: NEGATIVE
Specific Gravity, Urine: 1.01 (ref 1.000–1.030)
Total Protein, Urine: NEGATIVE
Urine Glucose: NEGATIVE
Urobilinogen, UA: 0.2 (ref 0.0–1.0)
pH: 6.5 (ref 5.0–8.0)

## 2019-09-16 NOTE — Telephone Encounter (Signed)
Pt states she had colon done on Friday and she has had soreness and tenderness on both sides of her lower abdomen since procedure. States her lower abdomen hurts all the time, rates the discomfort a 5 on a scale of 1-10. Pt feels like she has to hold her abdomen due to the discomfort. She has no fever. Please advise.

## 2019-09-16 NOTE — Patient Instructions (Signed)
Please follow up as needed 

## 2019-09-16 NOTE — Telephone Encounter (Signed)
Pt states the pain just started after the procedure. Pt to come for CBC and UA prior to appt today at 4pm with Dr. Henrene Pastor.

## 2019-09-16 NOTE — Telephone Encounter (Signed)
From my perspective the examination was uncomplicated.  Not sure why she is having the pain.  However, if this is new pain and began only after the procedure, then schedule her today to come in for an office evaluation at 4 PM.  Obtain CBC and and urinalysis this morning, prior to the evaluation

## 2019-09-16 NOTE — Progress Notes (Signed)
HISTORY OF PRESENT ILLNESS:  Sandra Booker is a 48 y.o. female who presents today for abdominal pain 3 days after having undergone colonoscopy.  On September 13, 2019 the patient underwent uneventful complete colonoscopy.  She was found to have 2 diminutive polyps which were removed.  Internal hemorrhoids.  The examination was otherwise unremarkable.  She states that since that time she has noticed a dull discomfort on her sides bilaterally.  This has not progressed.  This is not severe.  However, it persist.  She is eating well.  No fevers.  However, she has not had a bowel movement since her procedure.  She typically has diarrhea on a regular basis.  No other complaints.  She looks well.  We did obtain blood work prior to this visit.  CBC was unremarkable.  As well, unremarkable urinalysis.  The surgical pathology is pending  REVIEW OF SYSTEMS:  All non-GI ROS negative unless otherwise stated in the HPI except for sinus and allergy trouble, arthritis, back pain, headaches, sore throat, muscle cramps  Past Medical History:  Diagnosis Date  . Abnormal Pap smear   . Allergy   . Anemia   . Anxiety   . Arthritis   . Cyst of breast    right breast  . Encounter for insertion of mirena IUD 09/27/2010  . Fibroid   . GERD (gastroesophageal reflux disease)   . H/O cervicitis   . H/O menorrhagia   . History of allergic rhinitis   . History of iron deficiency   . History of vitamin D deficiency   . Hyperglycemia    on metformin but pt states no MD states DM  . Hyperlipidemia   . Hypoglycemia   . IBS (irritable bowel syndrome)   . Neuromuscular disorder ([place])    nerve damage in right ankle   . Pelvic pain in female    h/o  . Seizures ([place])    x 1 30 yrs ago with pregnancy - toxemia - none since   . UTI (urinary tract infection)    h/o    Past Surgical History:  Procedure Laterality Date  . ANKLE SURGERY  06/2011  . DILATION AND CURETTAGE OF UTERUS  09/29/2005  . HERNIA REPAIR    .  HYSTEROSCOPY  09/29/2005  . MYOMECTOMY      Social History Sandra Booker  reports that she has never smoked. She has never used smokeless tobacco. She reports that she does not drink alcohol or use drugs.  family history includes Breast cancer in her mother; Cancer (age of onset: 80) in her maternal grandfather and mother; Hyperlipidemia in her father; Hypertension in her father and mother.  No Known Allergies     PHYSICAL EXAMINATION: Vital signs: BP 110/70   Pulse 96   Temp 98.3 F (36.8 C)   Ht 5\' 4"  (1.626 m)   Wt 153 lb (69.4 kg)   BMI 26.26 kg/m   Constitutional: generally well-appearing, no acute distress Psychiatric: alert and oriented x3, cooperative Eyes: extraocular movements intact, anicteric, conjunctiva pink Mouth: oral pharynx moist, no lesions Neck: supple no lymphadenopathy Cardiovascular: heart regular rate and rhythm, no murmur Lungs: clear to auscultation bilaterally Abdomen: soft, nontender, nondistended, no obvious ascites, no peritoneal signs, normal bowel sounds, no organomegaly Rectal: Omitted Extremities: no clubbing, cyanosis, or lower extremity edema bilaterally Skin: no lesions on visible extremities Neuro: No focal deficits.  Cranial nerves intact  ASSESSMENT:  1.  Complaints of vague bilateral abdominal/side discomfort since her colonoscopy.  I suspect this is secondary to not having had a bowel movement in 3 days (which is unusual for her).  Her physical exam is entirely benign.  Laboratory and urinalysis unremarkable 2.  Diminutive colon polyps on colonoscopy.  Pathology pending   PLAN:  1.  Reassurance 2.  OTC laxative if necessary 3.  Follow-up colonoscopy in 7 to 10 years pending pathology 4.  Patient has been advised to contact the office should her problems with pain persist or worsen.  We could consider advanced imaging if such is the case..  She agrees.  She will resume general medical care with her PCP

## 2019-09-17 ENCOUNTER — Telehealth: Payer: Self-pay

## 2019-09-17 NOTE — Telephone Encounter (Signed)
Follow up call attempted.  NALM  

## 2019-09-18 ENCOUNTER — Encounter: Payer: Self-pay | Admitting: Internal Medicine

## 2019-10-29 ENCOUNTER — Other Ambulatory Visit: Payer: Self-pay | Admitting: Family Medicine

## 2019-10-29 DIAGNOSIS — Z1231 Encounter for screening mammogram for malignant neoplasm of breast: Secondary | ICD-10-CM

## 2019-11-19 ENCOUNTER — Ambulatory Visit
Admission: RE | Admit: 2019-11-19 | Discharge: 2019-11-19 | Disposition: A | Discharge status: Home / Self Care | Payer: BC Managed Care – PPO | Source: Ambulatory Visit | Attending: Family Medicine | Admitting: Family Medicine

## 2019-11-19 ENCOUNTER — Other Ambulatory Visit: Payer: Self-pay

## 2019-11-19 DIAGNOSIS — Z1231 Encounter for screening mammogram for malignant neoplasm of breast: Secondary | ICD-10-CM | POA: Diagnosis not present

## 2019-12-05 ENCOUNTER — Other Ambulatory Visit: Payer: Self-pay | Admitting: Family Medicine

## 2019-12-10 ENCOUNTER — Ambulatory Visit: Payer: BC Managed Care – PPO | Admitting: Family Medicine

## 2019-12-15 NOTE — Progress Notes (Signed)
Established Patient Office Visit  Subjective:  Patient ID: Sandra Booker, female    DOB: 12/18/1970  Age: 49 y.o. MRN: [id number]  CC:  Chief Complaint  Patient presents with  . Diabetes    3 month f/u  . Medication Refill    flexeril    HPI Sandra Booker presents for   Diabetes Mellitus: Patient presents for follow up of diabetes. She was diagnosed on 08/09/2019. She was also noted to have dyslipidemia and vitamin D deficiency.   Component     Latest Ref Rng & Units 08/09/2019 09/09/2019  Hemoglobin A1C     4.0 - 5.6 % 8.2 (A) 7.2 (A)   Lab Results  Component Value Date   HGBA1C 6.2 (A) 12/16/2019    She is on metformin She is taking pravastatin Exercises 3 days a week Eye Exam- up to date Foot Exam- up to date Immunizations- tdap and pneumo up to date COVID vaccine - not sure of vaccine  Dyslipidemia: Patient presents for evaluation of lipids.  Compliance with treatment thus far has been excellent.  A repeat fasting lipid profile was done.  The patient does not use medications that may worsen dyslipidemias (corticosteroids, progestins, anabolic steroids, diuretics, beta-blockers, amiodarone, cyclosporine, olanzapine). The patient exercises three times a week.  The patient is not known to have coexisting coronary artery disease.   The 10-year ASCVD risk score Mikey Bussing DC Brooke Bonito., et al., 2013) is: 6.1%   Values used to calculate the score:     Age: 78 years     Sex: Female     Is Non-Hispanic African American: Yes     Diabetic: Yes     Tobacco smoker: No     Systolic Blood Pressure: 282 mmHg     Is BP treated: No     HDL Cholesterol: 45 mg/dL     Total Cholesterol: 219 mg/dL   Wt Readings from Last 3 Encounters:  12/16/19 149 lb 3.2 oz (67.7 kg)  09/16/19 153 lb (69.4 kg)  09/13/19 153 lb (69.4 kg)     Her last vitamin D levels were good She is taking vitamin D supplement  Past Medical History:  Diagnosis Date  . Abnormal Pap smear   . Allergy   .  Anemia   . Anxiety   . Arthritis   . Cyst of breast    right breast  . Encounter for insertion of mirena IUD 09/27/2010  . Fibroid   . GERD (gastroesophageal reflux disease)   . H/O cervicitis   . H/O menorrhagia   . History of allergic rhinitis   . History of iron deficiency   . History of vitamin D deficiency   . Hyperglycemia    on metformin but pt states no MD states DM  . Hyperlipidemia   . Hypoglycemia   . IBS (irritable bowel syndrome)   . Neuromuscular disorder ([place])    nerve damage in right ankle   . Pelvic pain in female    h/o  . Seizures ([place])    x 1 30 yrs ago with pregnancy - toxemia - none since   . UTI (urinary tract infection)    h/o    Past Surgical History:  Procedure Laterality Date  . ANKLE SURGERY  06/2011  . DILATION AND CURETTAGE OF UTERUS  09/29/2005  . HERNIA REPAIR    . HYSTEROSCOPY  09/29/2005  . MYOMECTOMY      Family History  Problem Relation Age of Onset  .  Breast cancer Mother   . Hypertension Mother   . Cancer Mother 31       breast  . Hypertension Father   . Hyperlipidemia Father   . Cancer Maternal Grandfather 43       Prostate  . Colon cancer Neg Hx   . Colon polyps Neg Hx   . Esophageal cancer Neg Hx   . Rectal cancer Neg Hx   . Stomach cancer Neg Hx     Social History   Socioeconomic History  . Marital status: Married    Spouse name: Will  . Number of children: 2  . Years of education: 12+  . Highest education level: Not on file  Occupational History  . Occupation: TITLE Armed forces operational officer: [employer]  Tobacco Use  . Smoking status: Never Smoker  . Smokeless tobacco: Never Used  Substance and Sexual Activity  . Alcohol use: No  . Drug use: No  . Sexual activity: Yes    Birth control/protection: I.U.D.  Other Topics Concern  . Not on file  Social History Narrative   Lives with her husband and their 2 children, when they are not at school.   She works as an Barista.   Highest level of  education: some college   Social Determinants of Health   Financial Resource Strain:   . Difficulty of Paying Living Expenses: Not on file  Food Insecurity:   . Worried About Charity fundraiser in the Last Year: Not on file  . Ran Out of Food in the Last Year: Not on file  Transportation Needs:   . Lack of Transportation (Medical): Not on file  . Lack of Transportation (Non-Medical): Not on file  Physical Activity:   . Days of Exercise per Week: Not on file  . Minutes of Exercise per Session: Not on file  Stress:   . Feeling of Stress : Not on file  Social Connections:   . Frequency of Communication with Friends and Family: Not on file  . Frequency of Social Gatherings with Friends and Family: Not on file  . Attends Religious Services: Not on file  . Active Member of Clubs or Organizations: Not on file  . Attends Archivist Meetings: Not on file  . Marital Status: Not on file  Intimate Partner Violence:   . Fear of Current or Ex-Partner: Not on file  . Emotionally Abused: Not on file  . Physically Abused: Not on file  . Sexually Abused: Not on file    Outpatient Medications Prior to Visit  Medication Sig Dispense Refill  . acetaminophen (TYLENOL) 500 MG tablet Take 500 mg by mouth every 6 (six) hours as needed.    . cyclobenzaprine (FLEXERIL) 5 MG tablet TAKE 1 TO 2 TABLETS BY MOUTH AT BEDTIME FOR BACK PAIN OR SPASMS 60 tablet 5  . diphenhydrAMINE (BENADRYL ALLERGY) 25 mg capsule Take 25 mg by mouth every 8 (eight) hours as needed.    . hyoscyamine (LEVSIN, ANASPAZ) 0.125 MG tablet TAKE 1 TABLET BY MOUTH EVERY 6 HOURS AS NEEDED FOR CRAMPING 30 tablet 5  . levonorgestrel (MIRENA) 20 MCG/24HR IUD 1 each by Intrauterine route once.    . meloxicam (MOBIC) 7.5 MG tablet Take 1 tablet (7.5 mg total) by mouth daily. TAKE 1 TABLET BY MOUTH DAILY AS NEEDED FOR PAIN. TAKE WITH FOOD. NO OTHER NSAIDS 30 tablet 2  . metFORMIN (GLUCOPHAGE-XR) 500 MG 24 hr tablet TAKE 1 TABLET(500  MG)  BY MOUTH DAILY WITH BREAKFAST 30 tablet 3  . Multiple Vitamin (MULTIVITAMIN) tablet Take 1 tablet by mouth daily.    . nortriptyline (PAMELOR) 10 MG capsule Start nortriptyline '10mg'$  at bedtime for 2 week, then increase to 2 tablet at bedtime 60 capsule 3  . pantoprazole (PROTONIX) 40 MG tablet TAKE 1 TABLET(40 MG) BY MOUTH DAILY 90 tablet 3  . pravastatin (PRAVACHOL) 10 MG tablet Take 1 tablet (10 mg total) by mouth daily. 90 tablet 1  . Vitamin D, Ergocalciferol, (DRISDOL) 1.25 MG (50000 UT) CAPS capsule Take 1 capsule (50,000 Units total) by mouth every 7 (seven) days. 12 capsule 3   No facility-administered medications prior to visit.    No Known Allergies  ROS Review of Systems Review of Systems  Constitutional: Negative for activity change, appetite change, chills and fever.  HENT: Negative for congestion, nosebleeds, trouble swallowing and voice change.   Respiratory: Negative for cough, shortness of breath and wheezing.   Gastrointestinal: Negative for diarrhea, nausea and vomiting.  Genitourinary: Negative for difficulty urinating, dysuria, flank pain and hematuria.  Musculoskeletal: Negative for back pain, joint swelling and neck pain.  Neurological: Negative for dizziness, speech difficulty, light-headedness and numbness.  See HPI. All other review of systems negative.     Objective:    Physical Exam  BP 126/86 (BP Location: Left Arm, Patient Position: Sitting, Cuff Size: Normal)   Pulse 90   Temp 98.2 F (36.8 C) (Oral)   Resp 17   Ht '5\' 4"'$  (1.626 m)   Wt 149 lb 3.2 oz (67.7 kg)   SpO2 100%   BMI 25.61 kg/m  Wt Readings from Last 3 Encounters:  12/16/19 149 lb 3.2 oz (67.7 kg)  09/16/19 153 lb (69.4 kg)  09/13/19 153 lb (69.4 kg)   Physical Exam  Constitutional: Oriented to person, place, and time. Appears well-developed and well-nourished.  HENT:  Head: Normocephalic and atraumatic.  Eyes: Conjunctivae and EOM are normal.  Cardiovascular: Normal rate,  regular rhythm, normal heart sounds and intact distal pulses.  No murmur heard. Pulmonary/Chest: Effort normal and breath sounds normal. No stridor. No respiratory distress. Has no wheezes.  Neurological: Is alert and oriented to person, place, and time.  Skin: Skin is warm. Capillary refill takes less than 2 seconds.  Psychiatric: Has a normal mood and affect. Behavior is normal. Judgment and thought content normal.    There are no preventive care reminders to display for this patient.  There are no preventive care reminders to display for this patient.  Lab Results  Component Value Date   TSH 0.58 09/30/2016   Lab Results  Component Value Date   WBC 6.2 09/16/2019   HGB 12.7 09/16/2019   HCT 37.7 09/16/2019   MCV 90.1 09/16/2019   PLT 239.0 09/16/2019   Lab Results  Component Value Date   NA 137 08/09/2019   K 4.9 08/09/2019   CO2 22 08/09/2019   GLUCOSE 127 (H) 08/09/2019   BUN 8 08/09/2019   CREATININE 0.70 08/09/2019   BILITOT 0.3 08/09/2019   ALKPHOS 79 08/09/2019   AST 16 08/09/2019   ALT 12 08/09/2019   PROT 7.3 08/09/2019   ALBUMIN 4.7 08/09/2019   CALCIUM 10.4 (H) 08/09/2019   Lab Results  Component Value Date   CHOL 219 (H) 08/09/2019   Lab Results  Component Value Date   HDL 45 08/09/2019   Lab Results  Component Value Date   LDLCALC 155 (H) 08/09/2019   Lab Results  Component Value Date   TRIG 107 08/09/2019   Lab Results  Component Value Date   CHOLHDL 4.9 (H) 08/09/2019   Lab Results  Component Value Date   HGBA1C 6.2 (A) 12/16/2019      Assessment & Plan:   Problem List Items Addressed This Visit    None    Visit Diagnoses    Type 2 diabetes mellitus with hyperglycemia, without long-term current use of insulin ([place])    -  Primary  well controlled hemoglobin a1c is at goal Continue exercise Lipids monitored and renal function in range On metformin Reviewed diabetic foot care Emphasized importance of eye and dental exam       Relevant Orders   POCT glycosylated hemoglobin (Hb A1C) (Completed)   Lipid panel   CMP14+EGFR   Vitamin D deficiency    - will assess    Relevant Orders   VITAMIN D 25 Hydroxy (Vit-D Deficiency, Fractures)   Dyslipidemia    -  Discussed compliance with statin   Relevant Orders   Lipid panel   CMP14+EGFR   High priority for 2019-nCoV vaccine    -  Advised vaccine       No orders of the defined types were placed in this encounter.   Follow-up: No follow-ups on file.    Forrest Moron, MD

## 2019-12-16 ENCOUNTER — Other Ambulatory Visit: Payer: Self-pay

## 2019-12-16 ENCOUNTER — Ambulatory Visit: Payer: BC Managed Care – PPO | Admitting: Family Medicine

## 2019-12-16 ENCOUNTER — Encounter: Payer: Self-pay | Admitting: Family Medicine

## 2019-12-16 VITALS — BP 126/86 | HR 90 | Temp 98.2°F | Resp 17 | Ht 64.0 in | Wt 149.2 lb

## 2019-12-16 DIAGNOSIS — E785 Hyperlipidemia, unspecified: Secondary | ICD-10-CM | POA: Diagnosis not present

## 2019-12-16 DIAGNOSIS — E1165 Type 2 diabetes mellitus with hyperglycemia: Secondary | ICD-10-CM | POA: Diagnosis not present

## 2019-12-16 DIAGNOSIS — E559 Vitamin D deficiency, unspecified: Secondary | ICD-10-CM | POA: Diagnosis not present

## 2019-12-16 DIAGNOSIS — Z23 Encounter for immunization: Secondary | ICD-10-CM | POA: Diagnosis not present

## 2019-12-16 LAB — POCT GLYCOSYLATED HEMOGLOBIN (HGB A1C): Hemoglobin A1C: 6.2 % — AB (ref 4.0–5.6)

## 2019-12-16 MED ORDER — CYCLOBENZAPRINE HCL 5 MG PO TABS: ORAL_TABLET | ORAL | 5 refills | Status: DC

## 2019-12-16 NOTE — Patient Instructions (Signed)
° ° ° °  If you have lab work done today you will be contacted with your lab results within the next 2 weeks.  If you have not heard from us then please contact us. The fastest way to get your results is to register for My Chart. ° ° °IF you received an x-ray today, you will receive an invoice from [facility]. Please contact [facility] at [phone] with questions or concerns regarding your invoice.  ° °IF you received labwork today, you will receive an invoice from [facility]. Please contact [facility] at [phone] with questions or concerns regarding your invoice.  ° °Our billing staff will not be able to assist you with questions regarding bills from these companies. ° °You will be contacted with the lab results as soon as they are available. The fastest way to get your results is to activate your My Chart account. Instructions are located on the last page of this paperwork. If you have not heard from us regarding the results in 2 weeks, please contact this office. °  ° ° ° °

## 2019-12-17 LAB — CMP14+EGFR
ALT: 9 IU/L (ref 0–32)
AST: 15 IU/L (ref 0–40)
Albumin/Globulin Ratio: 2.1 (ref 1.2–2.2)
Albumin: 4.8 g/dL (ref 3.8–4.8)
Alkaline Phosphatase: 77 IU/L (ref 39–117)
BUN/Creatinine Ratio: 14 (ref 9–23)
BUN: 9 mg/dL (ref 6–24)
Bilirubin Total: 0.3 mg/dL (ref 0.0–1.2)
CO2: 20 mmol/L (ref 20–29)
Calcium: 10 mg/dL (ref 8.7–10.2)
Chloride: 102 mmol/L (ref 96–106)
Creatinine, Ser: 0.64 mg/dL (ref 0.57–1.00)
GFR calc Af Amer: 120 mL/min/{1.73_m2} (ref >59)
GFR calc non Af Amer: 104 mL/min/{1.73_m2} (ref >59)
Globulin, Total: 2.3 g/dL (ref 1.5–4.5)
Glucose: 85 mg/dL (ref 65–99)
Potassium: 4.6 mmol/L (ref 3.5–5.2)
Sodium: 138 mmol/L (ref 134–144)
Total Protein: 7.1 g/dL (ref 6.0–8.5)

## 2019-12-17 LAB — LIPID PANEL
Chol/HDL Ratio: 3.2 ratio (ref 0.0–4.4)
Cholesterol, Total: 149 mg/dL (ref 100–199)
HDL: 47 mg/dL (ref >39)
LDL Chol Calc (NIH): 91 mg/dL (ref 0–99)
Triglycerides: 49 mg/dL (ref 0–149)
VLDL Cholesterol Cal: 11 mg/dL (ref 5–40)

## 2019-12-17 LAB — VITAMIN D 25 HYDROXY (VIT D DEFICIENCY, FRACTURES): Vit D, 25-Hydroxy: 70.6 ng/mL (ref 30.0–100.0)

## 2020-01-10 ENCOUNTER — Other Ambulatory Visit: Payer: Self-pay

## 2020-01-10 ENCOUNTER — Ambulatory Visit (HOSPITAL_COMMUNITY)
Admission: EM | Admit: 2020-01-10 | Discharge: 2020-01-10 | Disposition: A | Discharge status: Home / Self Care | Payer: BC Managed Care – PPO | Attending: Urgent Care | Admitting: Urgent Care

## 2020-01-10 ENCOUNTER — Encounter (HOSPITAL_COMMUNITY): Payer: Self-pay

## 2020-01-10 DIAGNOSIS — Z20822 Contact with and (suspected) exposure to covid-19: Secondary | ICD-10-CM | POA: Diagnosis not present

## 2020-01-10 NOTE — Discharge Instructions (Addendum)
We will notify you of your COVID-19 test results as they arrive and may take between 2 to 7 days.  In the meantime, if you develop symptoms including fever, chest pain, shortness of breath despite our current treatment plan then please report to the emergency room as this may be a sign of worsening status from possible COVID-19 infection.   For sore throat or cough try using a honey-based tea. Use 3 teaspoons of honey with juice squeezed from half lemon. Place shaved pieces of ginger into 1/2-1 cup of water and warm over stove top. Then mix the ingredients and repeat every 4 hours as needed. Please take Tylenol 325mg  every 6 hours. Hydrate very well with at least 2 liters of water. Eat light meals such as soups to replenish electrolytes and soft fruits, veggies. Start an antihistamine like Zyrtec (cetirizine) at 10mg  daily for postnasal drainage, sinus congestion.

## 2020-01-10 NOTE — ED Triage Notes (Signed)
Patient presents to Urgent Care with complaints of covid exposure since her daughter tested positive yesterday. Patient reports she lives in the same household, is not having any sx at this time.

## 2020-01-10 NOTE — ED Provider Notes (Signed)
[place]   MRN: [id number] DOB: 10-09-1971  Subjective:   Sandra Booker is a 49 y.o. female presenting for COVID-19 testing due to exposure from their daughter, lives at home with them.  Denies any symptoms at the time.  No current facility-administered medications for this encounter.  Current Outpatient Medications:  .  acetaminophen (TYLENOL) 500 MG tablet, Take 500 mg by mouth every 6 (six) hours as needed., Disp: , Rfl:  .  cyclobenzaprine (FLEXERIL) 5 MG tablet, TAKE 1 TO 2 TABLETS BY MOUTH AT BEDTIME FOR BACK PAIN OR SPASMS, Disp: 60 tablet, Rfl: 5 .  diphenhydrAMINE (BENADRYL ALLERGY) 25 mg capsule, Take 25 mg by mouth every 8 (eight) hours as needed., Disp: , Rfl:  .  hyoscyamine (LEVSIN, ANASPAZ) 0.125 MG tablet, TAKE 1 TABLET BY MOUTH EVERY 6 HOURS AS NEEDED FOR CRAMPING, Disp: 30 tablet, Rfl: 5 .  levonorgestrel (MIRENA) 20 MCG/24HR IUD, 1 each by Intrauterine route once., Disp: , Rfl:  .  meloxicam (MOBIC) 7.5 MG tablet, Take 1 tablet (7.5 mg total) by mouth daily. TAKE 1 TABLET BY MOUTH DAILY AS NEEDED FOR PAIN. TAKE WITH FOOD. NO OTHER NSAIDS, Disp: 30 tablet, Rfl: 2 .  metFORMIN (GLUCOPHAGE-XR) 500 MG 24 hr tablet, TAKE 1 TABLET(500 MG) BY MOUTH DAILY WITH BREAKFAST, Disp: 30 tablet, Rfl: 3 .  Multiple Vitamin (MULTIVITAMIN) tablet, Take 1 tablet by mouth daily., Disp: , Rfl:  .  nortriptyline (PAMELOR) 10 MG capsule, Start nortriptyline 10mg  at bedtime for 2 week, then increase to 2 tablet at bedtime, Disp: 60 capsule, Rfl: 3 .  pantoprazole (PROTONIX) 40 MG tablet, TAKE 1 TABLET(40 MG) BY MOUTH DAILY, Disp: 90 tablet, Rfl: 3 .  pravastatin (PRAVACHOL) 10 MG tablet, Take 1 tablet (10 mg total) by mouth daily., Disp: 90 tablet, Rfl: 1 .  Vitamin D, Ergocalciferol, (DRISDOL) 1.25 MG (50000 UT) CAPS capsule, Take 1 capsule (50,000 Units total) by mouth every 7 (seven) days., Disp: 12 capsule, Rfl: 3   No Known Allergies  Past Medical History:  Diagnosis Date    . Abnormal Pap smear   . Allergy   . Anemia   . Anxiety   . Arthritis   . Cyst of breast    right breast  . Encounter for insertion of mirena IUD 09/27/2010  . Fibroid   . GERD (gastroesophageal reflux disease)   . H/O cervicitis   . H/O menorrhagia   . History of allergic rhinitis   . History of iron deficiency   . History of vitamin D deficiency   . Hyperglycemia    on metformin but pt states no MD states DM  . Hyperlipidemia   . Hypoglycemia   . IBS (irritable bowel syndrome)   . Neuromuscular disorder ([place])    nerve damage in right ankle   . Pelvic pain in female    h/o  . Seizures ([place])    x 1 30 yrs ago with pregnancy - toxemia - none since   . UTI (urinary tract infection)    h/o     Past Surgical History:  Procedure Laterality Date  . ANKLE SURGERY  06/2011  . DILATION AND CURETTAGE OF UTERUS  09/29/2005  . HERNIA REPAIR    . HYSTEROSCOPY  09/29/2005  . MYOMECTOMY      Family History  Problem Relation Age of Onset  . Breast cancer Mother   . Hypertension Mother   . Cancer Mother 38       breast  .  Hypertension Father   . Hyperlipidemia Father   . Cancer Maternal Grandfather 84       Prostate  . Colon cancer Neg Hx   . Colon polyps Neg Hx   . Esophageal cancer Neg Hx   . Rectal cancer Neg Hx   . Stomach cancer Neg Hx     Social History   Tobacco Use  . Smoking status: Never Smoker  . Smokeless tobacco: Never Used  Substance Use Topics  . Alcohol use: No  . Drug use: No    Review of Systems  Constitutional: Negative for fever and malaise/fatigue.  HENT: Negative for congestion, ear pain, sinus pain and sore throat.   Eyes: Negative for blurred vision, double vision, discharge and redness.  Respiratory: Negative for cough, hemoptysis, shortness of breath and wheezing.   Cardiovascular: Negative for chest pain.  Gastrointestinal: Negative for abdominal pain, diarrhea, nausea and vomiting.  Genitourinary: Negative for dysuria, flank pain and  hematuria.  Musculoskeletal: Negative for myalgias.  Skin: Negative for rash.  Neurological: Negative for dizziness, weakness and headaches.  Psychiatric/Behavioral: Negative for depression and substance abuse.     Objective:   Vitals: BP 123/83 (BP Location: Left Arm)   Pulse 83   Temp 98.3 F (36.8 C) (Oral)   Resp 15   SpO2 99%   Physical Exam Constitutional:      General: She is not in acute distress.    Appearance: Normal appearance. She is well-developed. She is not ill-appearing, toxic-appearing or diaphoretic.  HENT:     Head: Normocephalic and atraumatic.     Nose: Nose normal.     Mouth/Throat:     Mouth: Mucous membranes are moist.     Pharynx: Oropharynx is clear.  Eyes:     General: No scleral icterus.    Extraocular Movements: Extraocular movements intact.     Pupils: Pupils are equal, round, and reactive to light.  Cardiovascular:     Rate and Rhythm: Normal rate.  Pulmonary:     Effort: Pulmonary effort is normal.  Skin:    General: Skin is warm and dry.  Neurological:     General: No focal deficit present.     Mental Status: She is alert and oriented to person, place, and time.  Psychiatric:        Mood and Affect: Mood normal.        Behavior: Behavior normal.        Thought Content: Thought content normal.        Judgment: Judgment normal.     Assessment and Plan :   1. Exposure to COVID-19 virus     Counseled patient on nature of COVID-19 including modes of transmission, diagnostic testing, management and supportive care.  Counseled on medications used for symptomatic relief. COVID 19 testing is pending. Counseled patient on potential for adverse effects with medications prescribed/recommended today, ER and return-to-clinic precautions discussed, patient verbalized understanding.     Jaynee Eagles, PA-C 01/10/20 1030

## 2020-01-13 LAB — NOVEL CORONAVIRUS, NAA (HOSP ORDER, SEND-OUT TO REF LAB; TAT 18-24 HRS): SARS-CoV-2, NAA: NOT DETECTED

## 2020-01-14 ENCOUNTER — Encounter: Payer: Self-pay | Admitting: Family Medicine

## 2020-02-12 ENCOUNTER — Other Ambulatory Visit: Payer: Self-pay | Admitting: Family Medicine

## 2020-03-16 ENCOUNTER — Ambulatory Visit: Payer: BC Managed Care – PPO | Admitting: Family Medicine

## 2020-03-16 ENCOUNTER — Other Ambulatory Visit: Payer: Self-pay

## 2020-03-16 ENCOUNTER — Encounter: Payer: Self-pay | Admitting: Family Medicine

## 2020-03-16 VITALS — BP 121/76 | HR 82 | Temp 98.0°F | Ht 64.0 in | Wt 151.2 lb

## 2020-03-16 DIAGNOSIS — E1165 Type 2 diabetes mellitus with hyperglycemia: Secondary | ICD-10-CM

## 2020-03-16 LAB — POCT GLYCOSYLATED HEMOGLOBIN (HGB A1C): Hemoglobin A1C: 6.6 % — AB (ref 4.0–5.6)

## 2020-03-16 NOTE — Patient Instructions (Signed)
° ° ° °  If you have lab work done today you will be contacted with your lab results within the next 2 weeks.  If you have not heard from us then please contact us. The fastest way to get your results is to register for My Chart. ° ° °IF you received an x-ray today, you will receive an invoice from [facility]. Please contact [facility] at [phone] with questions or concerns regarding your invoice.  ° °IF you received labwork today, you will receive an invoice from [facility]. Please contact [facility] at [phone] with questions or concerns regarding your invoice.  ° °Our billing staff will not be able to assist you with questions regarding bills from these companies. ° °You will be contacted with the lab results as soon as they are available. The fastest way to get your results is to activate your My Chart account. Instructions are located on the last page of this paperwork. If you have not heard from us regarding the results in 2 weeks, please contact this office. °  ° ° ° °

## 2020-03-16 NOTE — Progress Notes (Signed)
Established Patient Office Visit  Subjective:  Patient ID: Sandra Booker, female    DOB: 08-Sep-1971  Age: 49 y.o. MRN: [id number]  CC:  Chief Complaint  Patient presents with  . Medical Management of Chronic Issues    f/u     HPI Sandra Booker presents for   Diabetes Mellitus: Patient presents for follow up of diabetes. Symptoms: none. Symptoms have stabilized. Patient denies foot ulcerations, hyperglycemia, hypoglycemia , increase appetite, nausea, paresthesia of the feet and polydipsia.  Evaluation to date has been included: hemoglobin A1C.  Home sugars: patient does not check sugars. Treatment to date: more intensive attention to diet which has been effective, increased aerobic exercise which has been effective and Continued metformin which has been effective  .  Lab Results  Component Value Date   HGBA1C 6.2 (A) 12/16/2019   Wt Readings from Last 3 Encounters:  03/16/20 151 lb 3.2 oz (68.6 kg)  12/16/19 149 lb 3.2 oz (67.7 kg)  09/16/19 153 lb (69.4 kg)     Past Medical History:  Diagnosis Date  . Abnormal Pap smear   . Allergy   . Anemia   . Anxiety   . Arthritis   . Cyst of breast    right breast  . Encounter for insertion of mirena IUD 09/27/2010  . Fibroid   . GERD (gastroesophageal reflux disease)   . H/O cervicitis   . H/O menorrhagia   . History of allergic rhinitis   . History of iron deficiency   . History of vitamin D deficiency   . Hyperglycemia    on metformin but pt states no MD states DM  . Hyperlipidemia   . Hypoglycemia   . IBS (irritable bowel syndrome)   . Neuromuscular disorder ([place])    nerve damage in right ankle   . Pelvic pain in female    h/o  . Seizures ([place])    x 1 30 yrs ago with pregnancy - toxemia - none since   . UTI (urinary tract infection)    h/o    Past Surgical History:  Procedure Laterality Date  . ANKLE SURGERY  06/2011  . DILATION AND CURETTAGE OF UTERUS  09/29/2005  . HERNIA REPAIR    . HYSTEROSCOPY   09/29/2005  . MYOMECTOMY      Family History  Problem Relation Age of Onset  . Breast cancer Mother   . Hypertension Mother   . Cancer Mother 52       breast  . Hypertension Father   . Hyperlipidemia Father   . Cancer Maternal Grandfather 38       Prostate  . Colon cancer Neg Hx   . Colon polyps Neg Hx   . Esophageal cancer Neg Hx   . Rectal cancer Neg Hx   . Stomach cancer Neg Hx     Social History   Socioeconomic History  . Marital status: Married    Spouse name: Will  . Number of children: 2  . Years of education: 12+  . Highest education level: Not on file  Occupational History  . Occupation: TITLE Armed forces operational officer: [employer]  Tobacco Use  . Smoking status: Never Smoker  . Smokeless tobacco: Never Used  Substance and Sexual Activity  . Alcohol use: No  . Drug use: No  . Sexual activity: Yes    Birth control/protection: I.U.D.  Other Topics Concern  . Not on file  Social History Narrative   Lives  with her husband and their 2 children, when they are not at school.   She works as an Barista.   Highest level of education: some college   Social Determinants of Health   Financial Resource Strain:   . Difficulty of Paying Living Expenses:   Food Insecurity:   . Worried About Charity fundraiser in the Last Year:   . Arboriculturist in the Last Year:   Transportation Needs:   . Film/video editor (Medical):   Marland Kitchen Lack of Transportation (Non-Medical):   Physical Activity:   . Days of Exercise per Week:   . Minutes of Exercise per Session:   Stress:   . Feeling of Stress :   Social Connections:   . Frequency of Communication with Friends and Family:   . Frequency of Social Gatherings with Friends and Family:   . Attends Religious Services:   . Active Member of Clubs or Organizations:   . Attends Archivist Meetings:   Marland Kitchen Marital Status:   Intimate Partner Violence:   . Fear of Current or Ex-Partner:   . Emotionally Abused:     Marland Kitchen Physically Abused:   . Sexually Abused:     Outpatient Medications Prior to Visit  Medication Sig Dispense Refill  . acetaminophen (TYLENOL) 500 MG tablet Take 500 mg by mouth every 6 (six) hours as needed.    . cyclobenzaprine (FLEXERIL) 5 MG tablet TAKE 1 TO 2 TABLETS BY MOUTH AT BEDTIME FOR BACK PAIN OR SPASMS 60 tablet 5  . diphenhydrAMINE (BENADRYL ALLERGY) 25 mg capsule Take 25 mg by mouth every 8 (eight) hours as needed.    . hyoscyamine (LEVSIN, ANASPAZ) 0.125 MG tablet TAKE 1 TABLET BY MOUTH EVERY 6 HOURS AS NEEDED FOR CRAMPING 30 tablet 5  . levonorgestrel (MIRENA) 20 MCG/24HR IUD 1 each by Intrauterine route once.    . meloxicam (MOBIC) 7.5 MG tablet Take 1 tablet (7.5 mg total) by mouth daily. TAKE 1 TABLET BY MOUTH DAILY AS NEEDED FOR PAIN. TAKE WITH FOOD. NO OTHER NSAIDS 30 tablet 2  . metFORMIN (GLUCOPHAGE-XR) 500 MG 24 hr tablet TAKE 1 TABLET(500 MG) BY MOUTH DAILY WITH BREAKFAST 30 tablet 3  . Multiple Vitamin (MULTIVITAMIN) tablet Take 1 tablet by mouth daily.    . nortriptyline (PAMELOR) 10 MG capsule Start nortriptyline 10mg  at bedtime for 2 week, then increase to 2 tablet at bedtime 60 capsule 3  . pantoprazole (PROTONIX) 40 MG tablet TAKE 1 TABLET(40 MG) BY MOUTH DAILY 90 tablet 3  . pravastatin (PRAVACHOL) 10 MG tablet TAKE 1 TABLET(10 MG) BY MOUTH DAILY 90 tablet 3  . Vitamin D, Ergocalciferol, (DRISDOL) 1.25 MG (50000 UT) CAPS capsule Take 1 capsule (50,000 Units total) by mouth every 7 (seven) days. 12 capsule 3   No facility-administered medications prior to visit.    No Known Allergies  ROS Review of Systems Review of Systems  Constitutional: Negative for activity change, appetite change, chills and fever.  HENT: Negative for congestion, nosebleeds, trouble swallowing and voice change.   Respiratory: Negative for cough, shortness of breath and wheezing.   Gastrointestinal: Negative for diarrhea, nausea and vomiting.  Genitourinary: Negative for  difficulty urinating, dysuria, flank pain and hematuria.  Musculoskeletal: Negative for back pain, joint swelling and neck pain.  Neurological: Negative for dizziness, speech difficulty, light-headedness and numbness.  See HPI. All other review of systems negative.     Objective:    Physical Exam  BP 121/76  Pulse 82   Temp 98 F (36.7 C) (Temporal)   Ht 5\' 4"  (1.626 m)   Wt 151 lb 3.2 oz (68.6 kg)   LMP 03/16/2020   SpO2 100%   BMI 25.95 kg/m  Wt Readings from Last 3 Encounters:  03/16/20 151 lb 3.2 oz (68.6 kg)  12/16/19 149 lb 3.2 oz (67.7 kg)  09/16/19 153 lb (69.4 kg)   Physical Exam  Constitutional: Oriented to person, place, and time. Appears well-developed and well-nourished.  HENT:  Head: Normocephalic and atraumatic.  Eyes: Conjunctivae and EOM are normal.  Cardiovascular: Normal rate, regular rhythm, normal heart sounds and intact distal pulses.  No murmur heard. Pulmonary/Chest: Effort normal and breath sounds normal. No stridor. No respiratory distress. Has no wheezes.  Neurological: Is alert and oriented to person, place, and time.  Skin: Skin is warm. Capillary refill takes less than 2 seconds.  Psychiatric: Has a normal mood and affect. Behavior is normal. Judgment and thought content normal.    There are no preventive care reminders to display for this patient.  There are no preventive care reminders to display for this patient.  Lab Results  Component Value Date   TSH 0.58 09/30/2016   Lab Results  Component Value Date   WBC 6.2 09/16/2019   HGB 12.7 09/16/2019   HCT 37.7 09/16/2019   MCV 90.1 09/16/2019   PLT 239.0 09/16/2019   Lab Results  Component Value Date   NA 138 12/16/2019   K 4.6 12/16/2019   CO2 20 12/16/2019   GLUCOSE 85 12/16/2019   BUN 9 12/16/2019   CREATININE 0.64 12/16/2019   BILITOT 0.3 12/16/2019   ALKPHOS 77 12/16/2019   AST 15 12/16/2019   ALT 9 12/16/2019   PROT 7.1 12/16/2019   ALBUMIN 4.8 12/16/2019    CALCIUM 10.0 12/16/2019   Lab Results  Component Value Date   CHOL 149 12/16/2019   Lab Results  Component Value Date   HDL 47 12/16/2019   Lab Results  Component Value Date   LDLCALC 91 12/16/2019   Lab Results  Component Value Date   TRIG 49 12/16/2019   Lab Results  Component Value Date   CHOLHDL 3.2 12/16/2019   Lab Results  Component Value Date   HGBA1C 6.2 (A) 12/16/2019      Assessment & Plan:   Problem List Items Addressed This Visit    None    well controlled hemoglobin a1c is at goal Continue exercise Lipids monitored and renal function in range On metformin Reviewed diabetic foot care Emphasized importance of eye and dental exam     No orders of the defined types were placed in this encounter.   Follow-up: No follow-ups on file.    Forrest Moron, MD

## 2020-03-19 DIAGNOSIS — D229 Melanocytic nevi, unspecified: Secondary | ICD-10-CM | POA: Diagnosis not present

## 2020-03-19 DIAGNOSIS — Z01419 Encounter for gynecological examination (general) (routine) without abnormal findings: Secondary | ICD-10-CM | POA: Diagnosis not present

## 2020-03-19 DIAGNOSIS — Z3040 Encounter for surveillance of contraceptives, unspecified: Secondary | ICD-10-CM | POA: Diagnosis not present

## 2020-03-19 DIAGNOSIS — R87810 Cervical high risk human papillomavirus (HPV) DNA test positive: Secondary | ICD-10-CM | POA: Diagnosis not present

## 2020-03-19 DIAGNOSIS — Z124 Encounter for screening for malignant neoplasm of cervix: Secondary | ICD-10-CM | POA: Diagnosis not present

## 2020-03-19 LAB — HM PAP SMEAR

## 2020-03-19 LAB — RESULTS CONSOLE HPV: CHL HPV: POSITIVE

## 2020-03-23 ENCOUNTER — Other Ambulatory Visit: Payer: Self-pay

## 2020-03-23 DIAGNOSIS — K580 Irritable bowel syndrome with diarrhea: Secondary | ICD-10-CM

## 2020-03-23 MED ORDER — HYOSCYAMINE SULFATE 0.125 MG PO TABS: ORAL_TABLET | ORAL | 5 refills | Status: DC

## 2020-04-03 ENCOUNTER — Other Ambulatory Visit: Payer: Self-pay | Admitting: Obstetrics and Gynecology

## 2020-04-03 DIAGNOSIS — N841 Polyp of cervix uteri: Secondary | ICD-10-CM | POA: Diagnosis not present

## 2020-04-03 DIAGNOSIS — D229 Melanocytic nevi, unspecified: Secondary | ICD-10-CM | POA: Diagnosis not present

## 2020-04-03 DIAGNOSIS — L720 Epidermal cyst: Secondary | ICD-10-CM | POA: Diagnosis not present

## 2020-04-03 DIAGNOSIS — Z30430 Encounter for insertion of intrauterine contraceptive device: Secondary | ICD-10-CM | POA: Diagnosis not present

## 2020-04-11 ENCOUNTER — Other Ambulatory Visit: Payer: Self-pay | Admitting: Family Medicine

## 2020-04-11 NOTE — Telephone Encounter (Signed)
Requested Prescriptions  Pending Prescriptions Disp Refills  . metFORMIN (GLUCOPHAGE-XR) 500 MG 24 hr tablet [Pharmacy Med Name: METFORMIN ER '500MG'$  24HR TABS] 90 tablet 1    Sig: TAKE 1 TABLET(500 MG) BY MOUTH DAILY WITH BREAKFAST     Endocrinology:  Diabetes - Biguanides Passed - 04/11/2020  1:18 PM      Passed - Cr in normal range and within 360 days    Creat  Date Value Ref Range Status  09/30/2016 0.64 0.50 - 1.10 mg/dL Final   Creatinine, Ser  Date Value Ref Range Status  12/16/2019 0.64 0.57 - 1.00 mg/dL Final         Passed - HBA1C is between 0 and 7.9 and within 180 days    Hemoglobin A1C  Date Value Ref Range Status  03/16/2020 6.6 (A) 4.0 - 5.6 % Final   Hgb A1c MFr Bld  Date Value Ref Range Status  01/24/2014 5.9 (H) <5.7 % Final    Comment:                                                                           According to the ADA Clinical Practice Recommendations for 2011, when HbA1c is used as a screening test:     >=6.5%   Diagnostic of Diabetes Mellitus            (if abnormal result is confirmed)   5.7-6.4%   Increased risk of developing Diabetes Mellitus   References:Diagnosis and Classification of Diabetes Mellitus,Diabetes OZHY,8657,84(ONGEX 1):S62-S69 and Standards of Medical Care in         Diabetes - 2011,Diabetes Care,2011,34 (Suppl 1):S11-S61.           Passed - eGFR in normal range and within 360 days    GFR, Est African American  Date Value Ref Range Status  09/30/2016 >89 >=60 mL/min Final   GFR calc Af Amer  Date Value Ref Range Status  12/16/2019 120 >59 mL/min/1.73 Final   GFR, Est Non African American  Date Value Ref Range Status  09/30/2016 >89 >=60 mL/min Final   GFR calc non Af Amer  Date Value Ref Range Status  12/16/2019 104 >59 mL/min/1.73 Final         Passed - Valid encounter within last 6 months    Recent Outpatient Visits          3 weeks ago Type 2 diabetes mellitus with hyperglycemia, without long-term current  use of insulin ([place])   [facility] at [place], MD   3 months ago Type 2 diabetes mellitus with hyperglycemia, without long-term current use of insulin ([place])   Primary [facility] at [place], MD   7 months ago Newly diagnosed diabetes [facility])   [facility] at [place], MD   8 months ago Impaired fasting glucose   [facility] at [facility], Arlie Solomons, MD   1 year ago Encounter for health maintenance examination   Primary Care at [facility], Arlie Solomons, MD

## 2020-05-01 IMAGING — MG DIGITAL SCREENING BILATERAL MAMMOGRAM WITH TOMO AND CAD
8 series · 8 of 24 positions shown · non-contrast
Comparison: Previous exam(s).

CLINICAL DATA: Screening.

EXAM:
DIGITAL SCREENING BILATERAL MAMMOGRAM WITH TOMO AND CAD

[R MLO synth-2D]
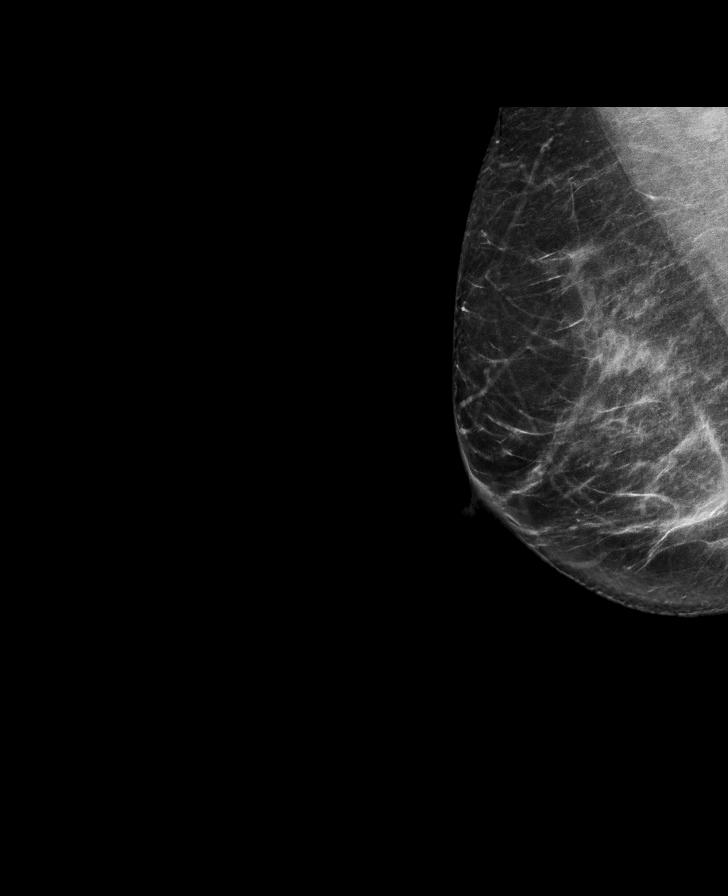

[L MLO synth-2D]
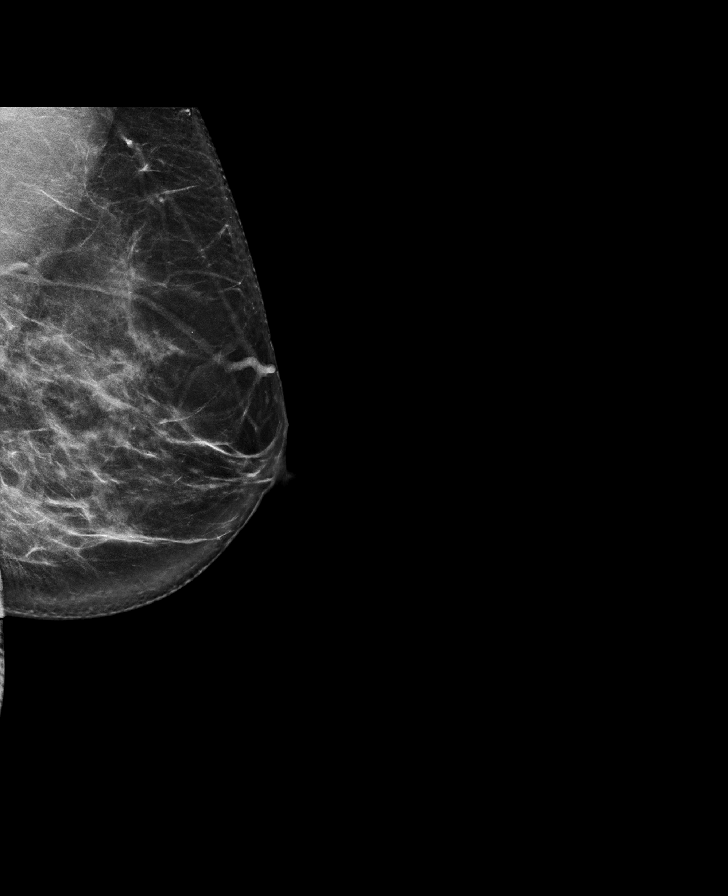

[R CC synth-2D]
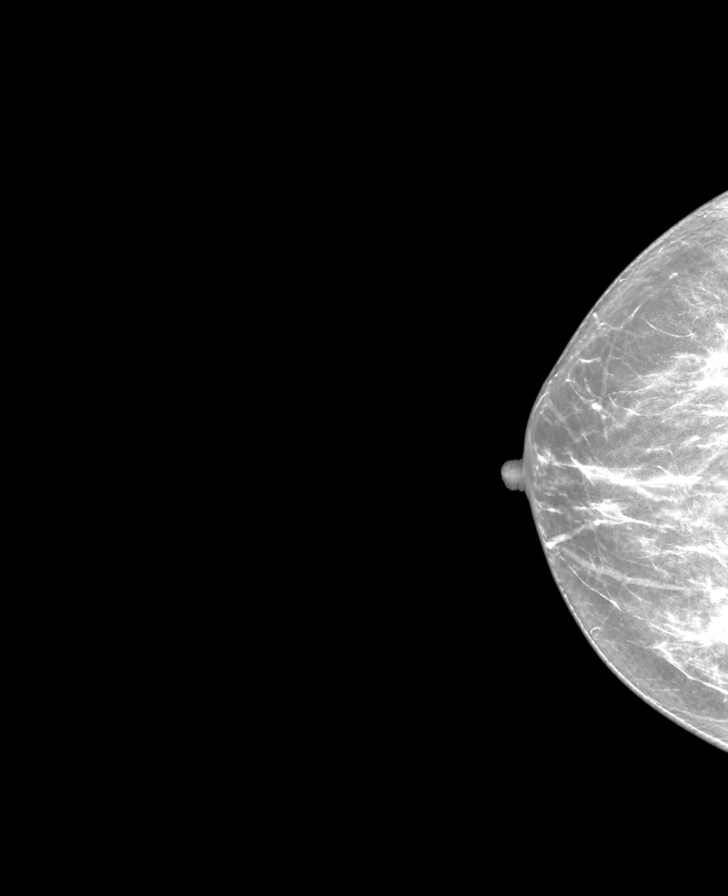

[L CC synth-2D]
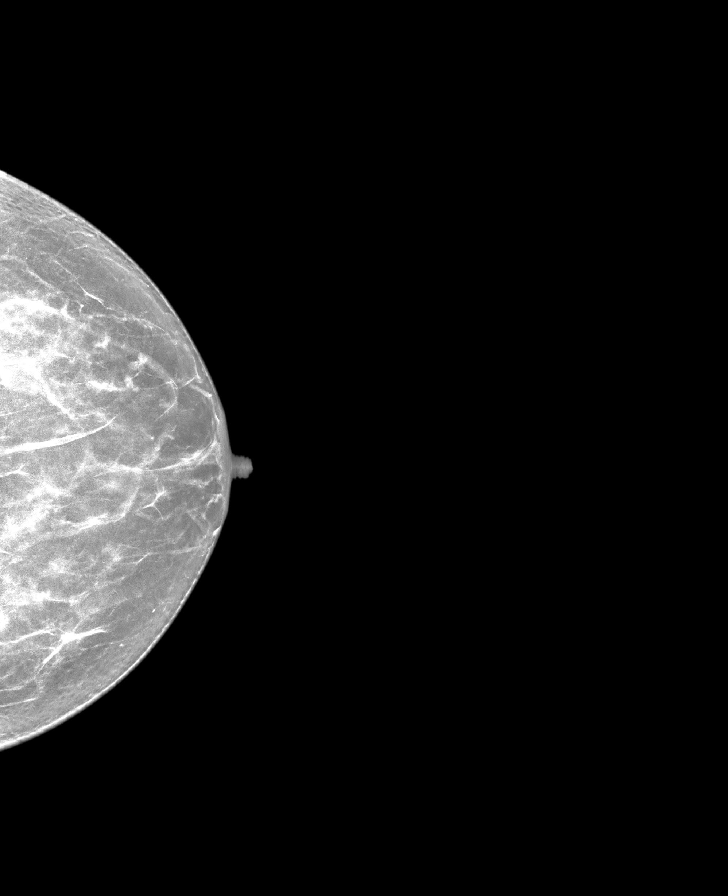

[L MLO tomo · tomo slice 41/82.0]
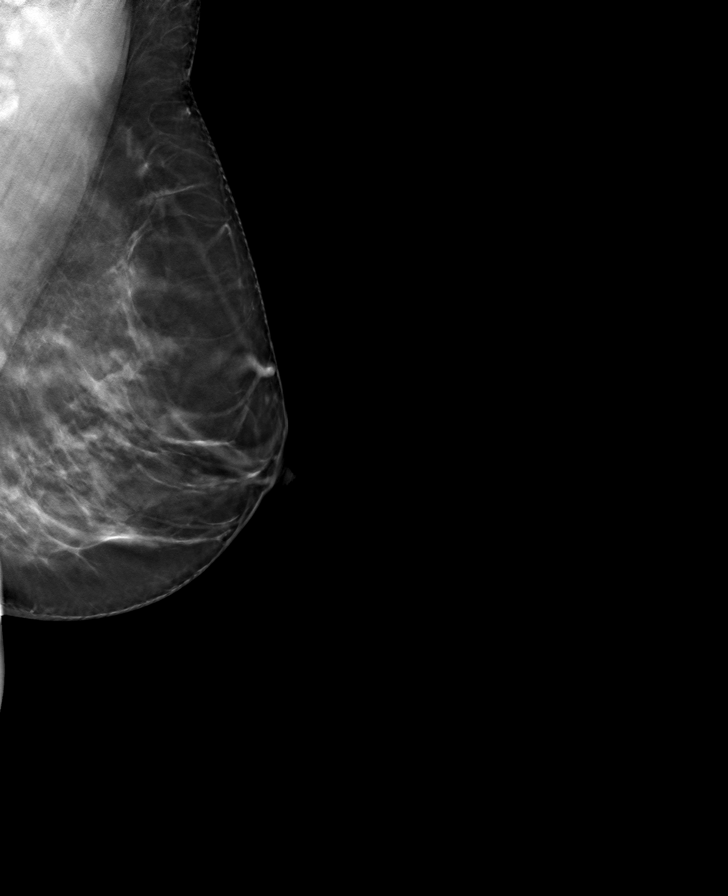

[L CC tomo · tomo slice 36/71.0]
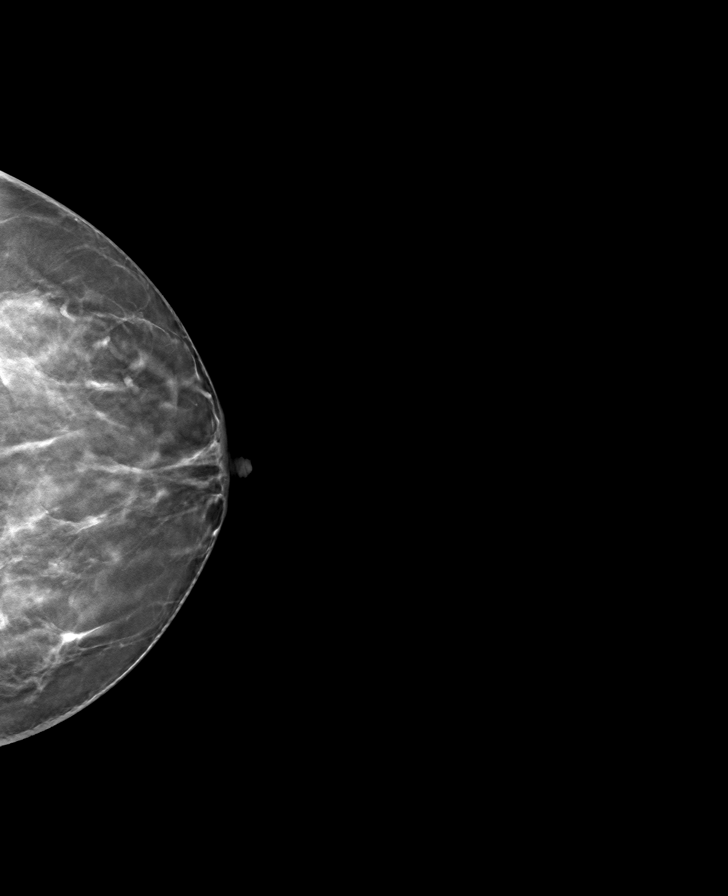

[R CC tomo · tomo slice 34/67.0]
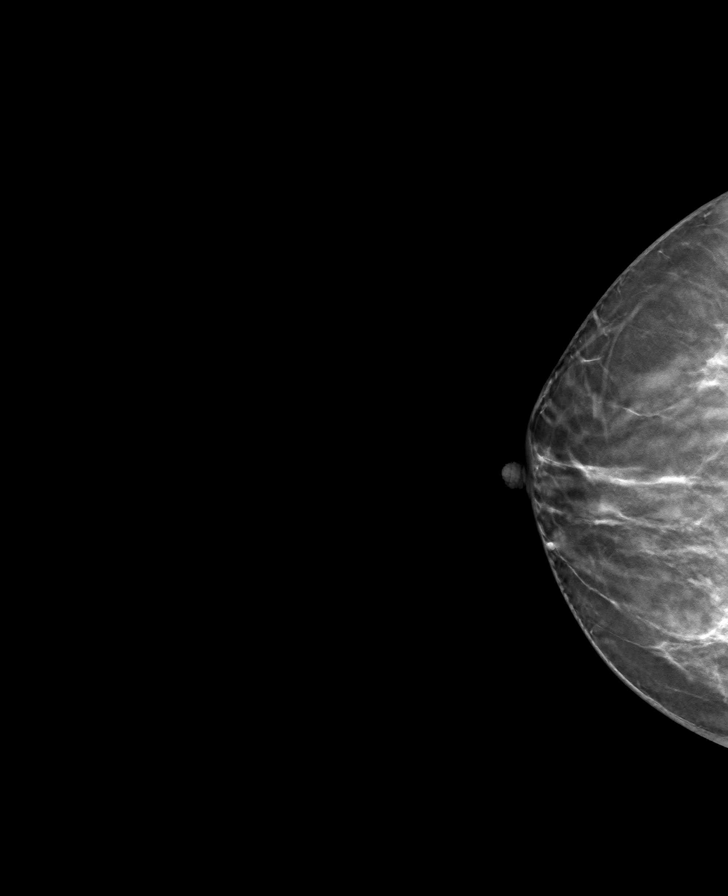

[R MLO tomo · tomo slice 41/81.0]
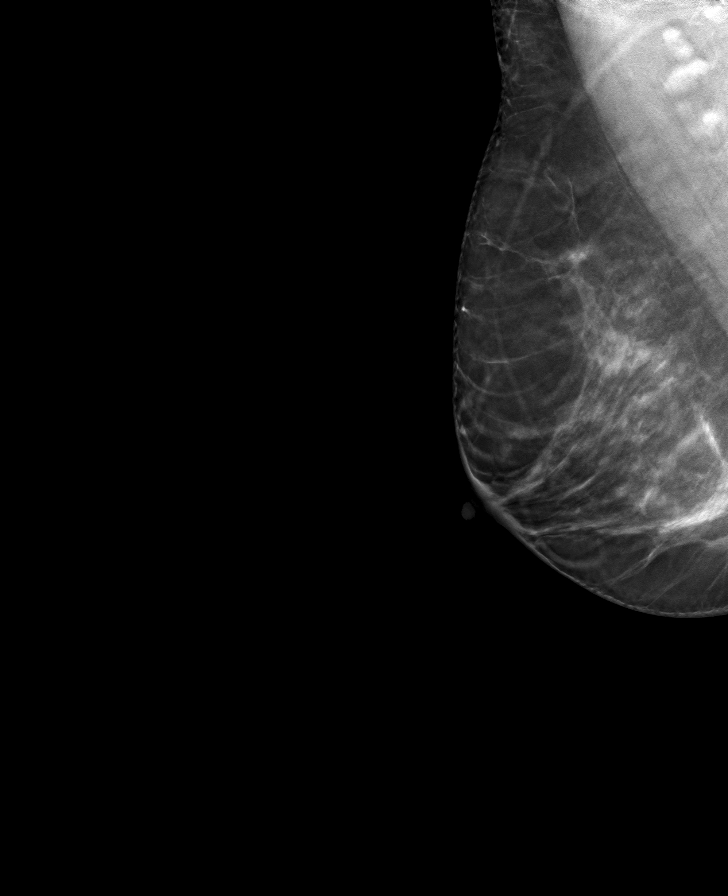

[8 of 24 positions shown; findings below may reference images not displayed]

ACR Breast Density Category c: The breast tissue is heterogeneously
dense, which may obscure small masses.
FINDINGS: There are no findings suspicious for malignancy. Images were
processed with CAD.
IMPRESSION: No mammographic evidence of malignancy. A result letter of this
screening mammogram will be mailed directly to the patient.

RECOMMENDATION:
Screening mammogram in one year. (Code:[id number])

BI-RADS CATEGORY  1: Negative.

## 2020-05-12 DIAGNOSIS — D259 Leiomyoma of uterus, unspecified: Secondary | ICD-10-CM | POA: Diagnosis not present

## 2020-05-12 DIAGNOSIS — Z30431 Encounter for routine checking of intrauterine contraceptive device: Secondary | ICD-10-CM | POA: Diagnosis not present

## 2020-05-26 DIAGNOSIS — E1142 Type 2 diabetes mellitus with diabetic polyneuropathy: Secondary | ICD-10-CM | POA: Diagnosis not present

## 2020-05-26 DIAGNOSIS — K219 Gastro-esophageal reflux disease without esophagitis: Secondary | ICD-10-CM | POA: Insufficient documentation

## 2020-06-15 DIAGNOSIS — E1142 Type 2 diabetes mellitus with diabetic polyneuropathy: Secondary | ICD-10-CM | POA: Diagnosis not present

## 2020-09-10 DIAGNOSIS — L814 Other melanin hyperpigmentation: Secondary | ICD-10-CM | POA: Diagnosis not present

## 2020-09-10 DIAGNOSIS — D229 Melanocytic nevi, unspecified: Secondary | ICD-10-CM | POA: Diagnosis not present

## 2020-09-10 DIAGNOSIS — L821 Other seborrheic keratosis: Secondary | ICD-10-CM | POA: Diagnosis not present

## 2020-09-10 DIAGNOSIS — L910 Hypertrophic scar: Secondary | ICD-10-CM | POA: Diagnosis not present

## 2020-10-21 ENCOUNTER — Other Ambulatory Visit: Payer: Self-pay | Admitting: *Deleted

## 2020-10-21 MED ORDER — METFORMIN HCL ER 500 MG PO TB24: ORAL_TABLET | ORAL | 0 refills | Status: DC

## 2020-10-22 ENCOUNTER — Other Ambulatory Visit: Payer: Self-pay | Admitting: Family Medicine

## 2020-10-22 DIAGNOSIS — Z1231 Encounter for screening mammogram for malignant neoplasm of breast: Secondary | ICD-10-CM

## 2020-10-29 DIAGNOSIS — Z7984 Long term (current) use of oral hypoglycemic drugs: Secondary | ICD-10-CM | POA: Diagnosis not present

## 2020-10-29 DIAGNOSIS — Z1331 Encounter for screening for depression: Secondary | ICD-10-CM | POA: Diagnosis not present

## 2020-10-29 DIAGNOSIS — E559 Vitamin D deficiency, unspecified: Secondary | ICD-10-CM | POA: Diagnosis not present

## 2020-10-29 DIAGNOSIS — E119 Type 2 diabetes mellitus without complications: Secondary | ICD-10-CM | POA: Insufficient documentation

## 2020-10-29 DIAGNOSIS — E11649 Type 2 diabetes mellitus with hypoglycemia without coma: Secondary | ICD-10-CM | POA: Diagnosis not present

## 2020-10-29 DIAGNOSIS — G4709 Other insomnia: Secondary | ICD-10-CM | POA: Diagnosis not present

## 2020-11-03 DIAGNOSIS — E559 Vitamin D deficiency, unspecified: Secondary | ICD-10-CM | POA: Diagnosis not present

## 2020-11-03 DIAGNOSIS — E11649 Type 2 diabetes mellitus with hypoglycemia without coma: Secondary | ICD-10-CM | POA: Diagnosis not present

## 2020-12-03 ENCOUNTER — Ambulatory Visit: Payer: BC Managed Care – PPO

## 2020-12-11 ENCOUNTER — Ambulatory Visit
Admission: RE | Admit: 2020-12-11 | Discharge: 2020-12-11 | Disposition: A | Discharge status: Home / Self Care | Payer: BC Managed Care – PPO | Source: Ambulatory Visit | Attending: Family Medicine | Admitting: Family Medicine

## 2020-12-11 ENCOUNTER — Other Ambulatory Visit: Payer: Self-pay

## 2020-12-11 DIAGNOSIS — Z1231 Encounter for screening mammogram for malignant neoplasm of breast: Secondary | ICD-10-CM

## 2021-07-01 DIAGNOSIS — R87810 Cervical high risk human papillomavirus (HPV) DNA test positive: Secondary | ICD-10-CM | POA: Diagnosis not present

## 2021-07-01 DIAGNOSIS — Z3040 Encounter for surveillance of contraceptives, unspecified: Secondary | ICD-10-CM | POA: Diagnosis not present

## 2021-07-01 DIAGNOSIS — Z01419 Encounter for gynecological examination (general) (routine) without abnormal findings: Secondary | ICD-10-CM | POA: Diagnosis not present

## 2021-07-01 DIAGNOSIS — Z8742 Personal history of other diseases of the female genital tract: Secondary | ICD-10-CM | POA: Diagnosis not present

## 2021-07-20 LAB — HM DIABETES EYE EXAM

## 2021-08-01 ENCOUNTER — Ambulatory Visit
Admission: RE | Admit: 2021-08-01 | Discharge: 2021-08-01 | Disposition: A | Discharge status: Home / Self Care | Payer: BC Managed Care – PPO | Source: Ambulatory Visit | Attending: Family Medicine | Admitting: Family Medicine

## 2021-08-01 ENCOUNTER — Other Ambulatory Visit: Payer: Self-pay

## 2021-08-01 VITALS — BP 124/85 | HR 90 | Temp 98.7°F | Resp 16

## 2021-08-01 DIAGNOSIS — M79641 Pain in right hand: Secondary | ICD-10-CM | POA: Diagnosis not present

## 2021-08-01 MED ORDER — DEXAMETHASONE SODIUM PHOSPHATE 10 MG/ML IJ SOLN
10.0000 mg | Freq: Once | INTRAMUSCULAR | Status: AC
  Administered 2021-08-01: 10 mg via INTRAMUSCULAR

## 2021-08-01 MED ORDER — DICLOFENAC SODIUM 1 % EX GEL: 2.0000 g | Freq: Four times a day (QID) | CUTANEOUS | 2 refills | Status: DC

## 2021-08-01 NOTE — ED Triage Notes (Signed)
Pt said since March she has been having right hand pain in her palm and into her thumb area. No injury, no swelling noted

## 2021-08-01 NOTE — ED Provider Notes (Signed)
[facility]    CSN: [id number] Arrival date & time: 08/01/21  1041      History   Chief Complaint Chief Complaint  Patient presents with   Hand Pain    HPI Sandra Booker is a 50 y.o. female.   Patient presenting today with 81-monthhistory of intermittent flareups of right hand pain that is worse at the base of the thumb.  She denies swelling, discoloration, numbness, tingling, radiation of pain, decreased range of motion but does notice a weakness when it is flared up and difficulty gripping and twisting such as doorknobs and jars.  No known injury to the area.  Has been trying over-the-counter pain relievers with no relief.  Does have a history of osteoarthritis in other joints.   Past Medical History:  Diagnosis Date   Abnormal Pap smear    Allergy    Anemia    Anxiety    Arthritis    Cyst of breast    right breast   Encounter for insertion of mirena IUD 09/27/2010   Fibroid    GERD (gastroesophageal reflux disease)    H/O cervicitis    H/O menorrhagia    History of allergic rhinitis    History of iron deficiency    History of vitamin D deficiency    Hyperglycemia    on metformin but pt states no MD states DM   Hyperlipidemia    Hypoglycemia    IBS (irritable bowel syndrome)    Neuromuscular disorder ([facility])    nerve damage in right ankle    Pelvic pain in female    h/o   [place]    x 1 30 yrs ago with pregnancy - toxemia - none since    UTI (urinary tract infection)    h/o    Patient Active Problem List   Diagnosis Date Noted   Arthritis 03/01/2018   Menorrhagia 03/01/2018   History of iron deficiency    H/O cervicitis    H/O menorrhagia    Fibroid    Hypoglycemia    UTI (urinary tract infection)    Pelvic pain in female    ASCUS favor benign    GERD (gastroesophageal reflux disease)    IBS (irritable bowel syndrome)    History of vitamin D deficiency    History of allergic rhinitis     Past Surgical History:  Procedure  Laterality Date   ANKLE SURGERY  06/2011   DILATION AND CURETTAGE OF UTERUS  09/29/2005   HERNIA REPAIR     HYSTEROSCOPY  09/29/2005   MYOMECTOMY      OB History     Gravida  2   Para  2   Term      Preterm      AB      Living  2      SAB      IAB      Ectopic      Multiple      Live Births               Home Medications    Prior to Admission medications   Medication Sig Start Date End Date Taking? Authorizing Provider  diclofenac Sodium (VOLTAREN) 1 % GEL Apply 2 g topically 4 (four) times daily. 08/01/21  Yes LVolney American PA-C  acetaminophen (TYLENOL) 500 MG tablet Take 500 mg by mouth every 6 (six) hours as needed.    [provider]  cyclobenzaprine (FLEXERIL) 5 MG tablet  TAKE 1 TO 2 TABLETS BY MOUTH AT BEDTIME FOR BACK PAIN OR SPASMS 12/16/19   Delia Chimes A, MD  diphenhydrAMINE (BENADRYL ALLERGY) 25 mg capsule Take 25 mg by mouth every 8 (eight) hours as needed.    [provider]  hyoscyamine (LEVSIN) 0.125 MG tablet TAKE 1 TABLET BY MOUTH EVERY 6 HOURS AS NEEDED FOR CRAMPING 03/23/20   Forrest Moron, MD  levonorgestrel (MIRENA) 20 MCG/24HR IUD 1 each by Intrauterine route once.    [provider]  meloxicam (MOBIC) 7.5 MG tablet Take 1 tablet (7.5 mg total) by mouth daily. TAKE 1 TABLET BY MOUTH DAILY AS NEEDED FOR PAIN. TAKE WITH FOOD. NO OTHER NSAIDS 08/07/18   Delia Chimes A, MD  metFORMIN (GLUCOPHAGE-XR) 500 MG 24 hr tablet TAKE 1 TABLET(500 MG) BY MOUTH DAILY WITH BREAKFAST 10/21/20   Horald Pollen, MD  Multiple Vitamin (MULTIVITAMIN) tablet Take 1 tablet by mouth daily.    [provider]  nortriptyline (PAMELOR) 10 MG capsule Start nortriptyline '10mg'$  at bedtime for 2 week, then increase to 2 tablet at bedtime 08/07/18   Delia Chimes A, MD  pantoprazole (PROTONIX) 40 MG tablet TAKE 1 TABLET(40 MG) BY MOUTH DAILY 08/16/19   Delia Chimes A, MD  pravastatin (PRAVACHOL) 10 MG tablet TAKE 1  TABLET(10 MG) BY MOUTH DAILY 02/12/20   Forrest Moron, MD  Vitamin D, Ergocalciferol, (DRISDOL) 1.25 MG (50000 UT) CAPS capsule Take 1 capsule (50,000 Units total) by mouth every 7 (seven) days. 08/11/19   Forrest Moron, MD    Family History Family History  Problem Relation Age of Onset   Breast cancer Mother    Hypertension Mother    Cancer Mother 27       breast   Hypertension Father    Hyperlipidemia Father    Cancer Maternal Grandfather 3       Prostate   Colon cancer Neg Hx    Colon polyps Neg Hx    Esophageal cancer Neg Hx    Rectal cancer Neg Hx    Stomach cancer Neg Hx     Social History Social History   Tobacco Use   Smoking status: Never   Smokeless tobacco: Never  Vaping Use   Vaping Use: Never used  Substance Use Topics   Alcohol use: No   Drug use: No     Allergies   Patient has no known allergies.   Review of Systems Review of Systems Per HPI  Physical Exam Triage Vital Signs ED Triage Vitals  Enc Vitals Group     BP 08/01/21 1112 124/85     Pulse Rate 08/01/21 1112 90     Resp 08/01/21 1112 16     Temp 08/01/21 1112 98.7 F (37.1 C)     Temp Source 08/01/21 1112 Oral     SpO2 08/01/21 1112 98 %     Weight --      Height --      Head Circumference --      Peak Flow --      Pain Score 08/01/21 1111 7     Pain Loc --      Pain Edu? --      Excl. in [place]? --    No data found.  Updated Vital Signs BP 124/85 (BP Location: Right Arm)   Pulse 90   Temp 98.7 F (37.1 C) (Oral)   Resp 16   SpO2 98%   Visual Acuity Right Eye Distance:  Left Eye Distance:   Bilateral Distance:    Right Eye Near:   Left Eye Near:    Bilateral Near:     Physical Exam Vitals and nursing note reviewed.  Constitutional:      Appearance: Normal appearance. She is not ill-appearing.  HENT:     Head: Atraumatic.  Eyes:     Extraocular Movements: Extraocular movements intact.     Conjunctiva/sclera: Conjunctivae normal.  Cardiovascular:      Rate and Rhythm: Normal rate and regular rhythm.     Heart sounds: Normal heart sounds.  Pulmonary:     Effort: Pulmonary effort is normal.     Breath sounds: Normal breath sounds.  Musculoskeletal:        General: Tenderness present. No swelling, deformity or signs of injury. Normal range of motion.     Cervical back: Normal range of motion and neck supple.     Comments: Minimal tenderness to palpation base of right thumb.  Good range of motion, grip strength full and equal bilateral hands.  Skin:    General: Skin is warm and dry.     Findings: No bruising or erythema.  Neurological:     Mental Status: She is alert and oriented to person, place, and time.     Comments: Right upper extremity neurovascularly intact  Psychiatric:        Mood and Affect: Mood normal.        Thought Content: Thought content normal.        Judgment: Judgment normal.     UC Treatments / Results  Labs (all labs ordered are listed, but only abnormal results are displayed) Labs Reviewed - No data to display  EKG   Radiology No results found.  Procedures Procedures (including critical care time)  Medications Ordered in UC Medications  dexamethasone (DECADRON) injection 10 mg (10 mg Intramuscular Given 08/01/21 1228)    Initial Impression / Assessment and Plan / UC Course  I have reviewed the triage vital signs and the nursing notes.  Pertinent labs & imaging results that were available during my care of the patient were reviewed by me and considered in my medical decision making (see chart for details).     Consistent with osteoarthritis flare.  No evidence of radicular symptoms at this time.  We will treat cautiously with IM Decadron, Voltaren gel, Epsom salt soaks, exercises.  Sports med information given for follow-up if worsening or not resolving.    Final Clinical Impressions(s) / UC Diagnoses   Final diagnoses:  Right hand pain   Discharge Instructions   None    ED Prescriptions      Medication Sig Dispense Auth. Provider   diclofenac Sodium (VOLTAREN) 1 % GEL Apply 2 g topically 4 (four) times daily. 150 g Volney American, [place]      PDMP not reviewed this encounter.   Volney American, [place] 08/01/21 1229

## 2021-08-23 ENCOUNTER — Ambulatory Visit: Payer: BC Managed Care – PPO | Admitting: Nurse Practitioner

## 2021-08-23 ENCOUNTER — Encounter: Payer: Self-pay | Admitting: Nurse Practitioner

## 2021-08-23 ENCOUNTER — Other Ambulatory Visit: Payer: Self-pay

## 2021-08-23 VITALS — BP 114/70 | HR 84 | Temp 97.8°F | Ht 64.5 in | Wt 153.0 lb

## 2021-08-23 DIAGNOSIS — E1165 Type 2 diabetes mellitus with hyperglycemia: Secondary | ICD-10-CM

## 2021-08-23 DIAGNOSIS — Z23 Encounter for immunization: Secondary | ICD-10-CM

## 2021-08-23 DIAGNOSIS — Z0001 Encounter for general adult medical examination with abnormal findings: Secondary | ICD-10-CM

## 2021-08-23 DIAGNOSIS — E782 Mixed hyperlipidemia: Secondary | ICD-10-CM

## 2021-08-23 DIAGNOSIS — E1169 Type 2 diabetes mellitus with other specified complication: Secondary | ICD-10-CM | POA: Insufficient documentation

## 2021-08-23 DIAGNOSIS — Z9889 Other specified postprocedural states: Secondary | ICD-10-CM | POA: Insufficient documentation

## 2021-08-23 DIAGNOSIS — M778 Other enthesopathies, not elsewhere classified: Secondary | ICD-10-CM

## 2021-08-23 DIAGNOSIS — Z975 Presence of (intrauterine) contraceptive device: Secondary | ICD-10-CM | POA: Insufficient documentation

## 2021-08-23 LAB — LIPID PANEL
Cholesterol: 197 mg/dL (ref 0–200)
HDL: 47.5 mg/dL (ref >39.00)
LDL Cholesterol: 131 mg/dL — ABNORMAL HIGH (ref 0–99)
NonHDL: 149.15
Total CHOL/HDL Ratio: 4
Triglycerides: 91 mg/dL (ref 0.0–149.0)
VLDL: 18.2 mg/dL (ref 0.0–40.0)

## 2021-08-23 LAB — COMPREHENSIVE METABOLIC PANEL
ALT: 9 U/L (ref 0–35)
AST: 13 U/L (ref 0–37)
Albumin: 4.4 g/dL (ref 3.5–5.2)
Alkaline Phosphatase: 67 U/L (ref 39–117)
BUN: 8 mg/dL (ref 6–23)
CO2: 26 mEq/L (ref 19–32)
Calcium: 9.5 mg/dL (ref 8.4–10.5)
Chloride: 102 mEq/L (ref 96–112)
Creatinine, Ser: 0.63 mg/dL (ref 0.40–1.20)
GFR: 101.8 mL/min (ref >60.00)
Glucose, Bld: 133 mg/dL — ABNORMAL HIGH (ref 70–99)
Potassium: 4 mEq/L (ref 3.5–5.1)
Sodium: 137 mEq/L (ref 135–145)
Total Bilirubin: 0.4 mg/dL (ref 0.2–1.2)
Total Protein: 6.9 g/dL (ref 6.0–8.3)

## 2021-08-23 LAB — CBC WITH DIFFERENTIAL/PLATELET
Basophils Absolute: 0 10*3/uL (ref 0.0–0.1)
Basophils Relative: 0.4 % (ref 0.0–3.0)
Eosinophils Absolute: 0 10*3/uL (ref 0.0–0.7)
Eosinophils Relative: 0.8 % (ref 0.0–5.0)
HCT: 37.6 % (ref 36.0–46.0)
Hemoglobin: 12.3 g/dL (ref 12.0–15.0)
Lymphocytes Relative: 36.9 % (ref 12.0–46.0)
Lymphs Abs: 2 10*3/uL (ref 0.7–4.0)
MCHC: 32.8 g/dL (ref 30.0–36.0)
MCV: 90.7 fl (ref 78.0–100.0)
Monocytes Absolute: 0.5 10*3/uL (ref 0.1–1.0)
Monocytes Relative: 8.5 % (ref 3.0–12.0)
Neutro Abs: 2.9 10*3/uL (ref 1.4–7.7)
Neutrophils Relative %: 53.4 % (ref 43.0–77.0)
Platelets: 216 10*3/uL (ref 150.0–400.0)
RBC: 4.14 Mil/uL (ref 3.87–5.11)
RDW: 12.8 % (ref 11.5–15.5)
WBC: 5.4 10*3/uL (ref 4.0–10.5)

## 2021-08-23 LAB — MICROALBUMIN / CREATININE URINE RATIO
Creatinine,U: 37 mg/dL
Microalb Creat Ratio: 1.9 mg/g (ref 0.0–30.0)
Microalb, Ur: <0.7 mg/dL (ref 0.0–1.9)

## 2021-08-23 LAB — TSH: TSH: 1.65 u[IU]/mL (ref 0.35–5.50)

## 2021-08-23 LAB — HEMOGLOBIN A1C: Hgb A1c MFr Bld: 8.2 % — ABNORMAL HIGH (ref 4.6–6.5)

## 2021-08-23 NOTE — Assessment & Plan Note (Addendum)
Diagnosed in 2020 Fasting glucose: 120-140 Current use of metformin only L. Foot neuropathy due to ankle surgery Normal r. Foot exam. Up to date with DM eye exam, report requested. BP at goal Repeat CMP, lipid panel, hgbA1c and urine microabumin today.

## 2021-08-23 NOTE — Patient Instructions (Addendum)
Use wrist brace with spicca splint. You will be contacted to schedule appt with ortho.  Go to lab for blood day  Sign medical release form to get your records from GYN and ophthalmology  Preventive Care 13-52 Years Old, Female Preventive care refers to lifestyle choices and visits with your health care provider that can promote health and wellness. This includes: A yearly physical exam. This is also called an annual wellness visit. Regular dental and eye exams. Immunizations. Screening for certain conditions. Healthy lifestyle choices, such as: Eating a healthy diet. Getting regular exercise. Not using drugs or products that contain nicotine and tobacco. Limiting alcohol use. What can I expect for my preventive care visit? Physical exam Your health care provider will check your: Height and weight. These may be used to calculate your BMI (body mass index). BMI is a measurement that tells if you are at a healthy weight. Heart rate and blood pressure. Body temperature. Skin for abnormal spots. Counseling Your health care provider may ask you questions about your: Past medical problems. Family's medical history. Alcohol, tobacco, and drug use. Emotional well-being. Home life and relationship well-being. Sexual activity. Diet, exercise, and sleep habits. Work and work Statistician. Access to firearms. Method of birth control. Menstrual cycle. Pregnancy history. What immunizations do I need? Vaccines are usually given at various ages, according to a schedule. Your health care provider will recommend vaccines for you based on your age, medical history, and lifestyle or other factors, such as travel or where you work. What tests do I need? Blood tests Lipid and cholesterol levels. These may be checked every 5 years, or more often if you are over 84 years old. Hepatitis C test. Hepatitis B test. Screening Lung cancer screening. You may have this screening every year starting at  age 47 if you have a 30-pack-year history of smoking and currently smoke or have quit within the past 15 years. Colorectal cancer screening. All adults should have this screening starting at age 36 and continuing until age 43. Your health care provider may recommend screening at age 8 if you are at increased risk. You will have tests every 1-10 years, depending on your results and the type of screening test. Diabetes screening. This is done by checking your blood sugar (glucose) after you have not eaten for a while (fasting). You may have this done every 1-3 years. Mammogram. This may be done every 1-2 years. Talk with your health care provider about when you should start having regular mammograms. This may depend on whether you have a family history of breast cancer. BRCA-related cancer screening. This may be done if you have a family history of breast, ovarian, tubal, or peritoneal cancers. Pelvic exam and Pap test. This may be done every 3 years starting at age 78. Starting at age 37, this may be done every 5 years if you have a Pap test in combination with an HPV test. Other tests STD (sexually transmitted disease) testing, if you are at risk. Bone density scan. This is done to screen for osteoporosis. You may have this scan if you are at high risk for osteoporosis. Talk with your health care provider about your test results, treatment options, and if necessary, the need for more tests. Follow these instructions at home: Eating and drinking  Eat a diet that includes fresh fruits and vegetables, whole grains, lean protein, and low-fat dairy products. Take vitamin and mineral supplements as recommended by your health care provider. Do not drink alcohol if:  Your health care provider tells you not to drink. You are pregnant, may be pregnant, or are planning to become pregnant. If you drink alcohol: Limit how much you have to 0-1 drink a day. Be aware of how much alcohol is in your  drink. In the [place] one drink equals one 12 oz bottle of beer (355 mL), one 5 oz glass of wine (148 mL), or one 1 oz glass of hard liquor (44 mL). Lifestyle Take daily care of your teeth and gums. Brush your teeth every morning and night with fluoride toothpaste. Floss one time each day. Stay active. Exercise for at least 30 minutes 5 or more days each week. Do not use any products that contain nicotine or tobacco, such as cigarettes, e-cigarettes, and chewing tobacco. If you need help quitting, ask your health care provider. Do not use drugs. If you are sexually active, practice safe sex. Use a condom or other form of protection to prevent STIs (sexually transmitted infections). If you do not wish to become pregnant, use a form of birth control. If you plan to become pregnant, see your health care provider for a prepregnancy visit. If told by your health care provider, take low-dose aspirin daily starting at age 6. Find healthy ways to cope with stress, such as: Meditation, yoga, or listening to music. Journaling. Talking to a trusted person. Spending time with friends and family. Safety Always wear your seat belt while driving or riding in a vehicle. Do not drive: If you have been drinking alcohol. Do not ride with someone who has been drinking. When you are tired or distracted. While texting. Wear a helmet and other protective equipment during sports activities. If you have firearms in your house, make sure you follow all gun safety procedures. What's next? Visit your health care provider once a year for an annual wellness visit. Ask your health care provider how often you should have your eyes and teeth checked. Stay up to date on all vaccines. This information is not intended to replace advice given to you by your health care provider. Make sure you discuss any questions you have with your health care provider. Document Revised: 01/15/2021 Document Reviewed: 07/19/2018 Elsevier  Patient Education  2022 [place] Tenosynovitis De Quervain's tenosynovitis is a condition that causes inflammation of the tendon on the thumb side of the wrist. Tendons are cords of tissue that connect bones to muscles. The tendons in the hand pass through a tunnel called a sheath. A slippery layer of tissue (synovium) lets the tendons move smoothly in the sheath. With de Quervain's tenosynovitis, the sheath swells or thickens, causing friction and pain. The condition is also called de Quervain's disease and de Quervain's syndrome. It occurs most often in women who are 71-68 years old. What are the causes? The exact cause of this condition is not known. It may be associated with overuse of the hand and wrist. What increases the risk? You are more likely to develop this condition if you: Use your hands far more than normal, especially if you repeat certain movements that involve twisting your hand or using a tight grip. Are pregnant. Are a middle-aged woman. Have rheumatoid arthritis. Have diabetes. What are the signs or symptoms? The main symptom of this condition is pain on the thumb side of the wrist. The pain may get worse when you grasp something or turn your wrist. Other symptoms may include: Pain that extends up the forearm. Swelling of your wrist and  hand. Trouble moving the thumb and wrist. A sensation of snapping in the wrist. A bump filled with fluid (cyst) in the area of the pain. How is this diagnosed? This condition may be diagnosed based on: Your symptoms and medical history. A physical exam. During the exam, your health care provider may do a simple test W[place] test) that involves pulling your thumb and wrist to see if this causes pain. You may also need to have an X-ray or ultrasound. How is this treated? Treatment for this condition may include: Avoiding any activity that causes pain and swelling. Taking medicines. Anti-inflammatory medicines  and corticosteroid injections may be used to reduce inflammation and relieve pain. Wearing a splint. Having surgery. This may be needed if other treatments do not work. Once the pain and swelling have gone down, you may start: Physical therapy. This includes exercises to improve movement and strength in your wrist and thumb. Occupational therapy. This includes adjusting how you move your wrist. Follow these instructions at home: If you have a splint: Wear the splint as told by your health care provider. Remove it only as told by your health care provider. Loosen the splint if your fingers tingle, become numb, or turn cold and blue. Keep the splint clean. If the splint is not waterproof: Do not let it get wet. Cover it with a watertight covering when you take a bath or a shower. Managing pain, stiffness, and swelling  Avoid movements and activities that cause pain and swelling in the wrist area. If directed, put ice on the painful area. This may be helpful after doing activities that involve the sore wrist. To do this: Put ice in a plastic bag. Place a towel between your skin and the bag. Leave the ice on for 20 minutes, 2-3 times a day. Remove the ice if your skin turns bright red. This is very important. If you cannot feel pain, heat, or cold, you have a greater risk of damage to the area. Move your fingers often to reduce stiffness and swelling. Raise (elevate) the injured area above the level of your heart while you are sitting or lying down. General instructions Return to your normal activities as told by your health care provider. Ask your health care provider what activities are safe for you. Take over-the-counter and prescription medicines only as told by your health care provider. Keep all follow-up visits. This is important. Contact a health care provider if: Your pain medicine does not help. Your pain gets worse. You develop new symptoms. Summary De Quervain's tenosynovitis  is a condition that causes inflammation of the tendon on the thumb side of the wrist. The condition occurs most often in women who are 80-76 years old. The exact cause of this condition is not known. It may be associated with overuse of the hand and wrist. Treatment starts with avoiding activity that causes pain or swelling in the wrist area. Other treatments may include wearing a splint and taking medicine. Sometimes, surgery is needed. This information is not intended to replace advice given to you by your health care provider. Make sure you discuss any questions you have with your health care provider. Document Revised: 02/19/2020 Document Reviewed: 02/19/2020 Elsevier Patient Education  2022 [employer].

## 2021-08-23 NOTE — Progress Notes (Signed)
Subjective:    Patient ID: Sandra Booker, female    DOB: 1971/05/05, 50 y.o.   MRN: [id number]  Patient presents today for CPE and eval of chronic conditions  Wrist Pain  The pain is present in the right wrist. This is a recurrent problem. The current episode started more than 1 month ago. There has been no history of extremity trauma. The problem occurs constantly. The problem has been waxing and waning. The quality of the pain is described as aching. The pain is moderate. Associated symptoms include tingling. Pertinent negatives include no fever, inability to bear weight, itching, joint locking, joint swelling, limited range of motion, numbness or stiffness. The symptoms are aggravated by activity. She has tried rest and NSAIDS for the symptoms. The treatment provided moderate relief. Family history does not include gout or rheumatoid arthritis. Her past medical history is significant for diabetes. There is no history of gout, osteoarthritis or rheumatoid arthritis.  Type 2 diabetes mellitus ([place]) Diagnosed in 2020 Fasting glucose: 120-140 Current use of metformin only L. Foot neuropathy due to ankle surgery Normal r. Foot exam. Up to date with DM eye exam, report requested. BP at goal Repeat CMP, lipid panel, hgbA1c and urine microabumin today.  Dental:up to date Diet:low carb Exercise:none Weight:  Wt Readings from Last 3 Encounters:  08/23/21 153 lb (69.4 kg)  03/16/20 151 lb 3.2 oz (68.6 kg)  12/16/19 149 lb 3.2 oz (67.7 kg)    Sexual History (orientation,birth control, marital status, STD):up to date with mammogram, PAP smear and colonoscopy  Depression/Suicide: Depression screen [facility] 2/9 08/23/2021 03/16/2020 12/16/2019 09/09/2019 08/09/2019 08/07/2018 08/07/2018  Decreased Interest 0 0 0 0 0 0 0  Down, Depressed, Hopeless 0 0 0 0 0 0 0  PHQ - 2 Score 0 0 0 0 0 0 0  Altered sleeping 2 - - - - - -  Tired, decreased energy 1 - - - - - -  Change in appetite 0 - - - - - -   Feeling bad or failure about yourself  0 - - - - - -  Trouble concentrating 0 - - - - - -  Moving slowly or fidgety/restless 0 - - - - - -  Suicidal thoughts 0 - - - - - -  PHQ-9 Score 3 - - - - - -  Difficult doing work/chores Not difficult at all - - - - - -   Immunizations: (TDAP, Hep C screen, Pneumovax, Influenza, zoster)  Health Maintenance  Topic Date Due   Eye exam for diabetics  Never done   Hepatitis C Screening: USPSTF Recommendation to screen - Ages 80-79 yo.  Never done   Zoster (Shingles) Vaccine (1 of 2) Never done   Urine Protein Check  09/08/2020   Hemoglobin A1C  09/15/2020   Pap Smear  11/30/2020   COVID-19 Vaccine (4 - Booster for Pfizer series) 12/31/2020   Complete foot exam   08/23/2022   Mammogram  12/11/2022   Tetanus Vaccine  01/25/2024   Colon Cancer Screening  09/12/2026   Flu Shot  Completed   HIV Screening  Completed   HPV Vaccine  Aged Out   Fall Risk: Fall Risk  03/16/2020 12/16/2019 09/09/2019 08/09/2019 08/07/2018 08/07/2018 06/25/2018  Falls in the past year? 0 0 0 0 No No No  Number falls in past yr: 0 0 0 0 - - -  Injury with Fall? 0 0 0 0 - - -  Follow up Falls evaluation  completed Falls evaluation completed - Falls evaluation completed - - -   Medications and allergies reviewed with patient and updated if appropriate.  Patient Active Problem List   Diagnosis Date Noted   IUD (intrauterine device) in place 08/23/2021   History of ankle surgery 08/23/2021   Mixed hyperlipidemia 08/23/2021   Type 2 diabetes mellitus ([place]) 10/29/2020   Vitamin D deficiency 10/29/2020   Gastroesophageal reflux disease 05/26/2020   Arthritis 03/01/2018   History of iron deficiency    H/O cervicitis    H/O menorrhagia    Fibroid    Hypoglycemia    Pelvic pain in female    ASCUS favor benign    IBS (irritable bowel syndrome)    History of allergic rhinitis     Current Outpatient Medications on File Prior to Visit  Medication Sig Dispense Refill    acetaminophen (TYLENOL) 500 MG tablet Take 500 mg by mouth every 6 (six) hours as needed.     cyclobenzaprine (FLEXERIL) 5 MG tablet TAKE 1 TO 2 TABLETS BY MOUTH AT BEDTIME FOR BACK PAIN OR SPASMS 60 tablet 5   diclofenac Sodium (VOLTAREN) 1 % GEL Apply 2 g topically 4 (four) times daily. 150 g 2   diphenhydrAMINE (BENADRYL) 25 mg capsule Take 25 mg by mouth every 8 (eight) hours as needed.     hyoscyamine (LEVSIN) 0.125 MG tablet TAKE 1 TABLET BY MOUTH EVERY 6 HOURS AS NEEDED FOR CRAMPING 30 tablet 5   levonorgestrel (MIRENA) 20 MCG/24HR IUD 1 each by Intrauterine route once.     metFORMIN (GLUCOPHAGE-XR) 500 MG 24 hr tablet TAKE 1 TABLET(500 MG) BY MOUTH DAILY WITH BREAKFAST 90 tablet 0   Multiple Vitamin (MULTIVITAMIN) tablet Take 1 tablet by mouth daily.     nortriptyline (PAMELOR) 10 MG capsule Start nortriptyline 10mg  at bedtime for 2 week, then increase to 2 tablet at bedtime 60 capsule 3   pantoprazole (PROTONIX) 40 MG tablet TAKE 1 TABLET(40 MG) BY MOUTH DAILY 90 tablet 3   pravastatin (PRAVACHOL) 10 MG tablet TAKE 1 TABLET(10 MG) BY MOUTH DAILY 90 tablet 3   Vitamin D, Ergocalciferol, (DRISDOL) 1.25 MG (50000 UT) CAPS capsule Take 1 capsule (50,000 Units total) by mouth every 7 (seven) days. 12 capsule 3   No current facility-administered medications on file prior to visit.    Past Medical History:  Diagnosis Date   Abnormal Pap smear    Allergy    Anemia    Anxiety    Arthritis    Cyst of breast    right breast   Diabetes mellitus without complication ([place]) 16/1096   Type 2   Encounter for insertion of mirena IUD 09/27/2010   Fibroid    GERD (gastroesophageal reflux disease)    H/O cervicitis    H/O menorrhagia    History of allergic rhinitis    History of iron deficiency    History of vitamin D deficiency    Hyperglycemia    on metformin but pt states no MD states DM   Hyperlipidemia    Hypoglycemia    IBS (irritable bowel syndrome)    Neuromuscular disorder (HCC)     nerve damage in right ankle    Pelvic pain in female    h/o   Seizures ([place])    x 1 30 yrs ago with pregnancy - toxemia - none since    UTI (urinary tract infection)    h/o    Past Surgical History:  Procedure Laterality Date  ANKLE SURGERY  06/22/2011   DILATION AND CURETTAGE OF UTERUS  09/29/2005   HERNIA REPAIR     HYSTEROSCOPY  09/29/2005   MYOMECTOMY      Social History   Socioeconomic History   Marital status: Married    Spouse name: Will   Number of children: 2   Years of education: 12+   Highest education level: Not on file  Occupational History   Occupation: TITLE Armed forces operational officer: [employer]  Tobacco Use   Smoking status: Never   Smokeless tobacco: Never  Vaping Use   Vaping Use: Never used  Substance and Sexual Activity   Alcohol use: No   Drug use: No   Sexual activity: Yes    Birth control/protection: I.U.D.  Other Topics Concern   Not on file  Social History Narrative   Lives with her husband and their 2 children, when they are not at school.   She works as an Barista.   Highest level of education: some college   Social Determinants of Radio broadcast assistant Strain: Not on file  Food Insecurity: Not on file  Transportation Needs: Not on file  Physical Activity: Not on file  Stress: Not on file  Social Connections: Not on file    Family History  Problem Relation Age of Onset   Breast cancer Mother    Hypertension Mother    Cancer Mother 46       breast   Arthritis Mother    Hypertension Father    Hyperlipidemia Father    Cancer Father    Cancer Maternal Grandfather 5       Prostate   Colon cancer Neg Hx    Colon polyps Neg Hx    Esophageal cancer Neg Hx    Rectal cancer Neg Hx    Stomach cancer Neg Hx         Review of Systems  Constitutional:  Negative for fever, malaise/fatigue and weight loss.  HENT:  Negative for congestion and sore throat.   Eyes:        Negative for visual changes   Respiratory:  Negative for cough and shortness of breath.   Cardiovascular:  Negative for chest pain, palpitations and leg swelling.  Gastrointestinal:  Negative for blood in stool, constipation, diarrhea and heartburn.  Genitourinary:  Negative for dysuria, frequency and urgency.  Musculoskeletal:  Positive for joint pain. Negative for falls, gout, myalgias and stiffness.  Skin:  Negative for itching and rash.  Neurological:  Positive for tingling. Negative for dizziness, sensory change, numbness and headaches.  Endo/Heme/Allergies:  Does not bruise/bleed easily.  Psychiatric/Behavioral:  Negative for depression, substance abuse and suicidal ideas. The patient is not nervous/anxious.    Objective:   Vitals:   08/23/21 0928  BP: 114/70  Pulse: 84  Temp: 97.8 F (36.6 C)  SpO2: 100%    Body mass index is 25.86 kg/m.   Physical Examination:  Physical Exam Vitals reviewed.  Constitutional:      General: She is not in acute distress.    Appearance: She is well-developed.  HENT:     Right Ear: Tympanic membrane, ear canal and external ear normal.     Left Ear: Tympanic membrane, ear canal and external ear normal.  Eyes:     Extraocular Movements: Extraocular movements intact.     Conjunctiva/sclera: Conjunctivae normal.  Cardiovascular:     Rate and Rhythm: Normal rate and regular rhythm.  Pulses:          Dorsalis pedis pulses are 2+ on the right side and 2+ on the left side.     Heart sounds: Normal heart sounds.  Pulmonary:     Effort: Pulmonary effort is normal. No respiratory distress.     Breath sounds: Normal breath sounds.  Chest:     Chest wall: No tenderness.  Abdominal:     General: Bowel sounds are normal.     Palpations: Abdomen is soft.  Genitourinary:    Comments: Deferred breast and pelvic exam to GYN Musculoskeletal:        General: Normal range of motion.     Right forearm: Normal.     Left forearm: Normal.     Right wrist: Tenderness  present. No swelling, effusion, bony tenderness, snuff box tenderness or crepitus. Normal range of motion. Normal pulse.     Left wrist: Normal.     Right hand: Decreased strength of finger abduction and thumb/finger opposition. Normal strength of wrist extension. Normal sensation. There is no disruption of two-point discrimination. Normal capillary refill. Normal pulse.     Left hand: Normal.     Cervical back: Normal range of motion and neck supple.     Right lower leg: No edema.     Left lower leg: No edema.     Right foot: Normal range of motion.     Left foot: Normal range of motion. No prominent metatarsal heads.  Feet:     Right foot:     Protective Sensation: 8 sites tested.  8 sites sensed.     Skin integrity: Skin integrity normal.     Toenail Condition: Right toenails are abnormally thick.     Left foot:     Protective Sensation: 8 sites tested.  8 sites sensed.     Skin integrity: Skin integrity normal.     Toenail Condition: Left toenails are abnormally thick.  Skin:    General: Skin is warm and dry.  Neurological:     Mental Status: She is alert and oriented to person, place, and time.     Deep Tendon Reflexes: Reflexes are normal and symmetric.  Psychiatric:        Mood and Affect: Mood normal.        Behavior: Behavior normal.        Thought Content: Thought content normal.   ASSESSMENT and PLAN: This visit occurred during the SARS-CoV-2 public health emergency.  Safety protocols were in place, including screening questions prior to the visit, additional usage of staff PPE, and extensive cleaning of exam room while observing appropriate contact time as indicated for disinfecting solutions.   Sandra Booker was seen today for establish care.  Diagnoses and all orders for this visit:  Encounter for preventative adult health care exam with abnormal findings -     Comprehensive metabolic panel -     CBC with Differential/Platelet -     TSH -     Microalbumin / creatinine  urine ratio  Type 2 diabetes mellitus with hyperglycemia, without long-term current use of insulin (HCC) -     Hemoglobin A1c  Mixed hyperlipidemia -     Lipid panel  Flu vaccine need -     Flu Vaccine QUAD High Dose(Fluad)  Right wrist tendonitis -     AMB referral to orthopedics     Problem List Items Addressed This Visit       Endocrine   Type 2 diabetes mellitus (  [place])    Diagnosed in 2020 Fasting glucose: 120-140 Current use of metformin only L. Foot neuropathy due to ankle surgery Normal r. Foot exam. Up to date with DM eye exam, report requested. BP at goal Repeat CMP, lipid panel, hgbA1c and urine microabumin today.      Relevant Orders   Hemoglobin A1c     Other   Mixed hyperlipidemia   Relevant Orders   Lipid panel   Other Visit Diagnoses     Encounter for preventative adult health care exam with abnormal findings    -  Primary   Relevant Orders   Comprehensive metabolic panel   CBC with Differential/Platelet   TSH   Microalbumin / creatinine urine ratio   Flu vaccine need       Relevant Orders   Flu Vaccine QUAD High Dose(Fluad) (Completed)   Right wrist tendonitis       Relevant Orders   AMB referral to orthopedics       Follow up: Return in about 3 months (around 11/23/2021) for DM and, hyperlipidemia (fasting).  Wilfred Lacy, NP

## 2021-08-24 ENCOUNTER — Ambulatory Visit: Payer: Self-pay

## 2021-08-24 ENCOUNTER — Encounter: Payer: Self-pay | Admitting: Orthopedic Surgery

## 2021-08-24 ENCOUNTER — Ambulatory Visit: Payer: BC Managed Care – PPO | Admitting: Nurse Practitioner

## 2021-08-24 ENCOUNTER — Ambulatory Visit: Payer: BC Managed Care – PPO | Admitting: Orthopedic Surgery

## 2021-08-24 VITALS — BP 125/79 | HR 106 | Ht 64.0 in | Wt 153.0 lb

## 2021-08-24 DIAGNOSIS — M79641 Pain in right hand: Secondary | ICD-10-CM | POA: Diagnosis not present

## 2021-08-24 MED ORDER — METFORMIN HCL ER 500 MG PO TB24: 1000.0000 mg | ORAL_TABLET | Freq: Every day | ORAL | 1 refills | Status: DC

## 2021-08-24 MED ORDER — PRAVASTATIN SODIUM 10 MG PO TABS: ORAL_TABLET | ORAL | 1 refills | Status: DC

## 2021-08-24 NOTE — Addendum Note (Signed)
Addended by: Wilfred Lacy L on: 08/24/2021 03:50 PM   Modules accepted: Orders

## 2021-08-24 NOTE — Progress Notes (Signed)
Office Visit Note   Patient: Sandra Booker           Date of Birth: Jul 29, 1971           MRN: [id number] Visit Date: 08/24/2021              Requested by: Flossie Buffy, NP [place],  [place] [address] PCP: Flossie Buffy, NP   Assessment & Plan: Visit Diagnoses:  1. Pain of right hand     Plan: Discussed with patient that she has a difficult problem to diagnose.  The radiographs of her hand today are largely unremarkable.  She doesn't seem to have any significant pain with any provocative testing and her pain seems poorly localized.  She does describe pain at the base of her thumb around the [facility] joint despite no radiographic evidence of arthritis.  She has tried some braces but has never tried a removable thumb spica type brace.  She is interested in trying that today.  We also discussed the role of hand therapy for strengthening her hand and their use of various other pain modalities.  She wants to see how the new brace goes first.  She will also try topical Voltaren gel. She can see me back in a few months if she's still having difficulty.   Follow-Up Instructions: No follow-ups on file.   Orders:  Orders Placed This Encounter  Procedures   XR Hand Complete Right   No orders of the defined types were placed in this encounter.     Procedures: No procedures performed   Clinical Data: No additional findings.   Subjective: Chief Complaint  Patient presents with   Right Wrist - Pain    +n/t, weakness, Pain: 5/10 (before injectio has been up to a 10, RIGHT Hand Dom, Started in 01/2021, and hasn't stopped, wakes her up hurting, had injection in it x 3 weeks ago-wished she would have got more relief with it.    This is a 50 yo RHD F who presents w/ chronic and poorly localized hand pain since March.  She denies any injury around that time and just woke up with hand pain. The pain has moved around since then.  It started around her ring finger  and dorsal hand and has since moved to around the base of her thumb.  She previously describes numbness and tingling but that has resolved.  Her pain is 5/10 at worst.  Her hand pain has improved since a recent shoulder CSI.  She has tried a wrist brace and a soft compression sleeve.  She has taken tylenol and ibuprofen with minimal symptom improvement.    Review of Systems  Constitutional: Negative.   Respiratory: Negative.    Cardiovascular: Negative.   Skin: Negative.     Objective: Vital Signs: BP 125/79 (BP Location: Left Arm, Patient Position: Sitting)   Pulse (!) 106   Ht 5\' 4"  (1.626 m)   Wt 153 lb (69.4 kg)   BMI 26.26 kg/m   Physical Exam Constitutional:      Appearance: Normal appearance.  Cardiovascular:     Rate and Rhythm: Normal rate.     Pulses: Normal pulses.  Pulmonary:     Effort: Pulmonary effort is normal.  Skin:    General: Skin is warm and dry.     Capillary Refill: Capillary refill takes less than 2 seconds.  Neurological:     Mental Status: She is alert.    Right Hand  Exam   Tenderness  Right hand tenderness location: Diffuse tenderness but worst at base of thumb.  Other  Erythema: absent Sensation: normal Pulse: present  Comments:  No obvious swelling.  Negative Finkelstein test. Negative CMC grind test.  No pain w/ resisted wrist or finger flexion or extension.  No foveal pain.  No pisotriquetral grind pain.  Negative Tinel at wrist.  No TTP at SL interval.  No palpable triggering or locking of any digits.      Specialty Comments:  No specialty comments available.  Imaging: 3V of the right hand taken today are reviewed and interprted by me.  They demonstrate well maintained radiocarpal, midcarpal MP, and IP joint spaces.  There is no significant thumb CMC degenerative changes.  There is no acute bony injury or instability.    PMFS History: Patient Active Problem List   Diagnosis Date Noted   IUD (intrauterine device) in place  08/23/2021   History of ankle surgery 08/23/2021   Mixed hyperlipidemia 08/23/2021   Type 2 diabetes mellitus ([place]) 10/29/2020   Vitamin D deficiency 10/29/2020   Gastroesophageal reflux disease 05/26/2020   Arthritis 03/01/2018   History of iron deficiency    H/O cervicitis    H/O menorrhagia    Fibroid    Hypoglycemia    Pelvic pain in female    ASCUS favor benign    IBS (irritable bowel syndrome)    History of allergic rhinitis    Past Medical History:  Diagnosis Date   Abnormal Pap smear    Allergy    Anemia    Anxiety    Arthritis    Cyst of breast    right breast   Diabetes mellitus without complication ([place]) 09/9416   Type 2   Encounter for insertion of mirena IUD 09/27/2010   Fibroid    GERD (gastroesophageal reflux disease)    H/O cervicitis    H/O menorrhagia    History of allergic rhinitis    History of iron deficiency    History of vitamin D deficiency    Hyperglycemia    on metformin but pt states no MD states DM   Hyperlipidemia    Hypoglycemia    IBS (irritable bowel syndrome)    Neuromuscular disorder (HCC)    nerve damage in right ankle    Pelvic pain in female    h/o   Seizures ([place])    x 1 30 yrs ago with pregnancy - toxemia - none since    UTI (urinary tract infection)    h/o    Family History  Problem Relation Age of Onset   Breast cancer Mother    Hypertension Mother    Cancer Mother 53       breast   Arthritis Mother    Hypertension Father    Hyperlipidemia Father    Cancer Father    Cancer Maternal Grandfather 51       Prostate   Colon cancer Neg Hx    Colon polyps Neg Hx    Esophageal cancer Neg Hx    Rectal cancer Neg Hx    Stomach cancer Neg Hx     Past Surgical History:  Procedure Laterality Date   ANKLE SURGERY  06/22/2011   DILATION AND CURETTAGE OF UTERUS  09/29/2005   HERNIA REPAIR     HYSTEROSCOPY  09/29/2005   MYOMECTOMY     Social History   Occupational History   Occupation: TITLE Armed forces operational officer:  [employer]  Tobacco Use   Smoking status: Never   Smokeless tobacco: Never  Vaping Use   Vaping Use: Never used  Substance and Sexual Activity   Alcohol use: No   Drug use: No   Sexual activity: Yes    Birth control/protection: I.U.D.

## 2021-10-03 ENCOUNTER — Encounter: Payer: Self-pay | Admitting: Nurse Practitioner

## 2021-10-03 DIAGNOSIS — K219 Gastro-esophageal reflux disease without esophagitis: Secondary | ICD-10-CM

## 2021-10-07 MED ORDER — PANTOPRAZOLE SODIUM 40 MG PO TBEC: DELAYED_RELEASE_TABLET | ORAL | 1 refills | Status: DC

## 2021-10-19 ENCOUNTER — Telehealth: Payer: BC Managed Care – PPO | Admitting: Family Medicine

## 2021-10-19 DIAGNOSIS — R051 Acute cough: Secondary | ICD-10-CM

## 2021-10-19 DIAGNOSIS — J0140 Acute pansinusitis, unspecified: Secondary | ICD-10-CM

## 2021-10-19 MED ORDER — BENZONATATE 100 MG PO CAPS: 100.0000 mg | ORAL_CAPSULE | Freq: Two times a day (BID) | ORAL | 0 refills | Status: DC | PRN

## 2021-10-19 MED ORDER — AMOXICILLIN-POT CLAVULANATE 875-125 MG PO TABS: 1.0000 | ORAL_TABLET | Freq: Two times a day (BID) | ORAL | 0 refills | Status: AC

## 2021-10-19 NOTE — Progress Notes (Signed)
Virtual Visit Consent   Sandra Booker, you are scheduled for a virtual visit with a [facility] provider today.     Just as with appointments in the office, your consent must be obtained to participate.  Your consent will be active for this visit and any virtual visit you may have with one of our providers in the next 365 days.     If you have a MyChart account, a copy of this consent can be sent to you electronically.  All virtual visits are billed to your insurance company just like a traditional visit in the office.    As this is a virtual visit, video technology does not allow for your provider to perform a traditional examination.  This may limit your provider's ability to fully assess your condition.  If your provider identifies any concerns that need to be evaluated in person or the need to arrange testing (such as labs, EKG, etc.), we will make arrangements to do so.     Although advances in technology are sophisticated, we cannot ensure that it will always work on either your end or our end.  If the connection with a video visit is poor, the visit may have to be switched to a telephone visit.  With either a video or telephone visit, we are not always able to ensure that we have a secure connection.     I need to obtain your verbal consent now.   Are you willing to proceed with your visit today?    Sandra Booker has provided verbal consent on 10/19/2021 for a virtual visit (video or telephone).   Perlie Mayo, NP   Date: 10/19/2021 9:51 AM   Virtual Visit via Video Note   I, Perlie Mayo, connected with  Sandra Booker  ([id number], 10-14-1971) on 10/19/21 at  9:45 AM EST by a video-enabled telemedicine application and verified that I am speaking with the correct person using two identifiers.  Location: Patient: Virtual Visit Location Patient: Home Provider: Virtual Visit Location Provider: Home Office   I discussed the limitations of evaluation and management by  telemedicine and the availability of in person appointments. The patient expressed understanding and agreed to proceed.    History of Present Illness: Sandra Booker is a 50 y.o. who identifies as a female who was assigned female at birth, and is being seen today for sinus symptoms. Onset was last Monday.  HPI: Sinusitis This is a new problem. The current episode started 1 to 4 weeks ago. The problem has been gradually worsening since onset. There has been no fever. Associated symptoms include congestion, coughing, headaches, a hoarse voice, sinus pressure, sneezing and a sore throat. Pertinent negatives include no chills or shortness of breath. Past treatments include acetaminophen (mucinex, cold and sinus). The treatment provided mild relief.     Problems:  Patient Active Problem List   Diagnosis Date Noted   IUD (intrauterine device) in place 08/23/2021   History of ankle surgery 08/23/2021   Mixed hyperlipidemia 08/23/2021   Type 2 diabetes mellitus ([place]) 10/29/2020   Vitamin D deficiency 10/29/2020   Gastroesophageal reflux disease 05/26/2020   Arthritis 03/01/2018   History of iron deficiency    H/O cervicitis    H/O menorrhagia    Fibroid    Hypoglycemia    Pelvic pain in female    ASCUS favor benign    IBS (irritable bowel syndrome)    History of allergic rhinitis  Allergies: No Known Allergies Medications:  Current Outpatient Medications:    amoxicillin-clavulanate (AUGMENTIN) 875-125 MG tablet, Take 1 tablet by mouth 2 (two) times daily for 7 days., Disp: 14 tablet, Rfl: 0   benzonatate (TESSALON) 100 MG capsule, Take 1 capsule (100 mg total) by mouth 2 (two) times daily as needed for cough., Disp: 20 capsule, Rfl: 0   acetaminophen (TYLENOL) 500 MG tablet, Take 500 mg by mouth every 6 (six) hours as needed., Disp: , Rfl:    cyclobenzaprine (FLEXERIL) 5 MG tablet, TAKE 1 TO 2 TABLETS BY MOUTH AT BEDTIME FOR BACK PAIN OR SPASMS, Disp: 60 tablet, Rfl: 5   diclofenac  Sodium (VOLTAREN) 1 % GEL, Apply 2 g topically 4 (four) times daily., Disp: 150 g, Rfl: 2   diphenhydrAMINE (BENADRYL) 25 mg capsule, Take 25 mg by mouth every 8 (eight) hours as needed., Disp: , Rfl:    hyoscyamine (LEVSIN) 0.125 MG tablet, TAKE 1 TABLET BY MOUTH EVERY 6 HOURS AS NEEDED FOR CRAMPING, Disp: 30 tablet, Rfl: 5   levonorgestrel (MIRENA) 20 MCG/24HR IUD, 1 each by Intrauterine route once., Disp: , Rfl:    metFORMIN (GLUCOPHAGE-XR) 500 MG 24 hr tablet, Take 2 tablets (1,000 mg total) by mouth daily with breakfast., Disp: 180 tablet, Rfl: 1   Multiple Vitamin (MULTIVITAMIN) tablet, Take 1 tablet by mouth daily., Disp: , Rfl:    nortriptyline (PAMELOR) 10 MG capsule, Start nortriptyline 10mg  at bedtime for 2 week, then increase to 2 tablet at bedtime, Disp: 60 capsule, Rfl: 3   pantoprazole (PROTONIX) 40 MG tablet, TAKE 1 TABLET(40 MG) BY MOUTH DAILY, Disp: 90 tablet, Rfl: 1   pravastatin (PRAVACHOL) 10 MG tablet, TAKE 1 TABLET(10 MG) BY MOUTH DAILY, Disp: 90 tablet, Rfl: 1   Vitamin D, Ergocalciferol, (DRISDOL) 1.25 MG (50000 UT) CAPS capsule, Take 1 capsule (50,000 Units total) by mouth every 7 (seven) days., Disp: 12 capsule, Rfl: 3  Observations/Objective: Patient is well-developed, well-nourished in no acute distress.  Resting comfortably  at home.  Head is normocephalic, atraumatic.  No labored breathing.  Speech is clear and coherent with logical content.  Patient is alert and oriented at baseline.  Cough Nasal tone  Assessment and Plan:  1. Acute non-recurrent pansinusitis S&S of sinus infection with drainage causing cough Will treat with aug and perles   Reviewed side effects, risks and benefits of medication.    - amoxicillin-clavulanate (AUGMENTIN) 875-125 MG tablet; Take 1 tablet by mouth 2 (two) times daily for 7 days.  Dispense: 14 tablet; Refill: 0 - benzonatate (TESSALON) 100 MG capsule; Take 1 capsule (100 mg total) by mouth 2 (two) times daily as needed for  cough.  Dispense: 20 capsule; Refill: 0  2. Acute cough  - benzonatate (TESSALON) 100 MG capsule; Take 1 capsule (100 mg total) by mouth 2 (two) times daily as needed for cough.  Dispense: 20 capsule; Refill: 0   Patient acknowledged agreement and understanding of the plan.   I discussed the assessment and treatment plan with the patient. The patient was provided an opportunity to ask questions and all were answered. The patient agreed with the plan and demonstrated an understanding of the instructions.   The patient was advised to call back or seek an in-person evaluation if the symptoms worsen or if the condition fails to improve as anticipated.   The above assessment and management plan was discussed with the patient. The patient verbalized understanding of and has agreed to the management plan. Patient is  aware to call the clinic if symptoms persist or worsen. Patient is aware when to return to the clinic for a follow-up visit. Patient educated on when it is appropriate to go to the emergency department.    Follow Up Instructions: I discussed the assessment and treatment plan with the patient. The patient was provided an opportunity to ask questions and all were answered. The patient agreed with the plan and demonstrated an understanding of the instructions.  A copy of instructions were sent to the patient via MyChart unless otherwise noted below.    The patient was advised to call back or seek an in-person evaluation if the symptoms worsen or if the condition fails to improve as anticipated.  Time:  I spent 10 minutes with the patient via telehealth technology discussing the above problems/concerns.    Perlie Mayo, NP

## 2021-10-19 NOTE — Patient Instructions (Signed)
I appreciate the opportunity to provide you with care for your health and wellness.  Take medication as directed   I hope you feel better soon and have a good rest of year and holiday season :)  Please continue to practice social distancing to keep you, your family, and our community safe.  If you must go out, please wear a mask and practice good handwashing.  Have a wonderful day. With Gratitude, Cherly Beach, DNP, AGNP-BC

## 2021-11-17 ENCOUNTER — Encounter: Payer: Self-pay | Admitting: Nurse Practitioner

## 2021-11-18 ENCOUNTER — Other Ambulatory Visit: Payer: Self-pay | Admitting: Family

## 2021-11-18 DIAGNOSIS — K580 Irritable bowel syndrome with diarrhea: Secondary | ICD-10-CM

## 2021-11-18 MED ORDER — HYOSCYAMINE SULFATE 0.125 MG PO TABS: ORAL_TABLET | ORAL | 5 refills | Status: DC

## 2021-11-18 NOTE — Telephone Encounter (Signed)
Refill request for: Hycosamine 0.125 mg LR 03/23/20, #30, 5 rf LOV 08/23/21 FOV 11/29/21  Please review and advise.  Thanks .Dm/cma

## 2021-11-29 ENCOUNTER — Ambulatory Visit: Payer: BC Managed Care – PPO | Admitting: Nurse Practitioner

## 2021-11-29 ENCOUNTER — Other Ambulatory Visit: Payer: Self-pay

## 2021-11-29 ENCOUNTER — Encounter: Payer: Self-pay | Admitting: Nurse Practitioner

## 2021-11-29 VITALS — BP 100/60 | HR 84 | Temp 97.1°F | Ht 64.5 in | Wt 147.4 lb

## 2021-11-29 DIAGNOSIS — E559 Vitamin D deficiency, unspecified: Secondary | ICD-10-CM | POA: Diagnosis not present

## 2021-11-29 DIAGNOSIS — E1165 Type 2 diabetes mellitus with hyperglycemia: Secondary | ICD-10-CM | POA: Diagnosis not present

## 2021-11-29 DIAGNOSIS — E782 Mixed hyperlipidemia: Secondary | ICD-10-CM | POA: Diagnosis not present

## 2021-11-29 LAB — VITAMIN D 25 HYDROXY (VIT D DEFICIENCY, FRACTURES): VITD: 73.98 ng/mL (ref 30.00–100.00)

## 2021-11-29 LAB — HEMOGLOBIN A1C: Hgb A1c MFr Bld: 6.8 % — ABNORMAL HIGH (ref 4.6–6.5)

## 2021-11-29 LAB — LIPID PANEL
Cholesterol: 164 mg/dL (ref 0–200)
HDL: 46.7 mg/dL (ref >39.00)
LDL Cholesterol: 103 mg/dL — ABNORMAL HIGH (ref 0–99)
NonHDL: 117.69
Total CHOL/HDL Ratio: 4
Triglycerides: 74 mg/dL (ref 0.0–149.0)
VLDL: 14.8 mg/dL (ref 0.0–40.0)

## 2021-11-29 NOTE — Assessment & Plan Note (Addendum)
Repeat vit.D: resolved

## 2021-11-29 NOTE — Assessment & Plan Note (Addendum)
No adverse effects with pravastatin Repeat lipid panel: Improved lipid panel, but not at goal. Increased pravastatin to 20mg . New rx sent. Normal vitamin D. High dose no longer needed. Continue multivitamin

## 2021-11-29 NOTE — Patient Instructions (Signed)
Sign medical release to get records from ophthalmology and GYN.  Go to lab for blood draw

## 2021-11-29 NOTE — Assessment & Plan Note (Addendum)
Fasting glucose at goal with metformin Eye exam completed by Dr. Luretha Rued, report requested  Repeat hgbA1c today: Improved HgbA1c: 8.2 to 6.8%. maintain metformin dose. Improved lipid panel, but not at goal. Increased pravastatin to 20mg . New rx sent. Maintain current med dose. F/up in 26months

## 2021-11-29 NOTE — Progress Notes (Signed)
Subjective:  Patient ID: Sandra Booker, female    DOB: 02/05/1971  Age: 51 y.o. MRN: [id number]  CC: Follow-up (3 month f/u on DM and cholesterol. Home blood sugars ranging 90s-120s/Pt is fasting )  HPI  Type 2 diabetes mellitus (HCC) Fasting glucose at goal with metformin Eye exam completed by Dr. Luretha Rued, report requested  Repeat hgbA1c today: Improved HgbA1c: 8.2 to 6.8%. maintain metformin dose. Improved lipid panel, but not at goal. Increased pravastatin to 20mg . New rx sent. Maintain current med dose. F/up in 36months  Vitamin D deficiency Repeat vit.D: resolved  Mixed hyperlipidemia No adverse effects with pravastatin Repeat lipid panel: Improved lipid panel, but not at goal. Increased pravastatin to 20mg . New rx sent. Normal vitamin D. High dose no longer needed. Continue multivitamin   Reviewed past Medical, Social and Family history today.  Outpatient Medications Prior to Visit  Medication Sig Dispense Refill   acetaminophen (TYLENOL) 500 MG tablet Take 500 mg by mouth every 6 (six) hours as needed.     cyclobenzaprine (FLEXERIL) 5 MG tablet TAKE 1 TO 2 TABLETS BY MOUTH AT BEDTIME FOR BACK PAIN OR SPASMS 60 tablet 5   diclofenac Sodium (VOLTAREN) 1 % GEL Apply 2 g topically 4 (four) times daily. 150 g 2   diphenhydrAMINE (BENADRYL) 25 mg capsule Take 25 mg by mouth every 8 (eight) hours as needed.     hyoscyamine (LEVSIN) 0.125 MG tablet TAKE 1 TABLET BY MOUTH EVERY 6 HOURS AS NEEDED FOR CRAMPING 30 tablet 5   levonorgestrel (MIRENA) 20 MCG/24HR IUD 1 each by Intrauterine route once.     Multiple Vitamin (MULTIVITAMIN) tablet Take 1 tablet by mouth daily.     nortriptyline (PAMELOR) 10 MG capsule Start nortriptyline 10mg  at bedtime for 2 week, then increase to 2 tablet at bedtime 60 capsule 3   pantoprazole (PROTONIX) 40 MG tablet TAKE 1 TABLET(40 MG) BY MOUTH DAILY 90 tablet 1   benzonatate (TESSALON) 100 MG capsule Take 1 capsule (100 mg total) by mouth 2  (two) times daily as needed for cough. 20 capsule 0   metFORMIN (GLUCOPHAGE-XR) 500 MG 24 hr tablet Take 2 tablets (1,000 mg total) by mouth daily with breakfast. 180 tablet 1   pravastatin (PRAVACHOL) 10 MG tablet TAKE 1 TABLET(10 MG) BY MOUTH DAILY 90 tablet 1   Vitamin D, Ergocalciferol, (DRISDOL) 1.25 MG (50000 UT) CAPS capsule Take 1 capsule (50,000 Units total) by mouth every 7 (seven) days. 12 capsule 3   No facility-administered medications prior to visit.    ROS See HPI  Objective:  BP 100/60 (BP Location: Right Arm, Patient Position: Sitting, Cuff Size: Normal)    Pulse 84    Temp (!) 97.1 F (36.2 C) (Temporal)    Ht 5' 4.5" (1.638 m)    Wt 147 lb 6.4 oz (66.9 kg)    SpO2 98%    BMI 24.91 kg/m   Physical Exam Cardiovascular:     Rate and Rhythm: Normal rate and regular rhythm.     Pulses: Normal pulses.     Heart sounds: Normal heart sounds.  Pulmonary:     Effort: Pulmonary effort is normal.     Breath sounds: Normal breath sounds.  Musculoskeletal:     Right lower leg: No edema.     Left lower leg: No edema.  Neurological:     Mental Status: She is alert and oriented to person, place, and time.  Psychiatric:  Mood and Affect: Mood normal.        Behavior: Behavior normal.        Thought Content: Thought content normal.    Assessment & Plan:  This visit occurred during the SARS-CoV-2 public health emergency.  Safety protocols were in place, including screening questions prior to the visit, additional usage of staff PPE, and extensive cleaning of exam room while observing appropriate contact time as indicated for disinfecting solutions.   [name] was seen today for follow-up.  Diagnoses and all orders for this visit:  Type 2 diabetes mellitus with hyperglycemia, without long-term current use of insulin (HCC) -     Hemoglobin A1c -     metFORMIN (GLUCOPHAGE-XR) 500 MG 24 hr tablet; Take 2 tablets (1,000 mg total) by mouth daily with breakfast.  Mixed  hyperlipidemia -     Lipid panel -     pravastatin (PRAVACHOL) 20 MG tablet; Take 1 tablet (20 mg total) by mouth daily. TAKE 1 TABLET(10 MG) BY MOUTH DAILY  Vitamin D deficiency -     Vitamin D (25 hydroxy)   Problem List Items Addressed This Visit       Endocrine   Type 2 diabetes mellitus ([place]) - Primary    Fasting glucose at goal with metformin Eye exam completed by Dr. Luretha Rued, report requested  Repeat hgbA1c today: Improved HgbA1c: 8.2 to 6.8%. maintain metformin dose. Improved lipid panel, but not at goal. Increased pravastatin to 20mg . New rx sent. Maintain current med dose. F/up in 54months      Relevant Medications   metFORMIN (GLUCOPHAGE-XR) 500 MG 24 hr tablet   pravastatin (PRAVACHOL) 20 MG tablet   Other Relevant Orders   Hemoglobin A1c (Completed)     Other   Mixed hyperlipidemia    No adverse effects with pravastatin Repeat lipid panel: Improved lipid panel, but not at goal. Increased pravastatin to 20mg . New rx sent. Normal vitamin D. High dose no longer needed. Continue multivitamin       Relevant Medications   pravastatin (PRAVACHOL) 20 MG tablet   Other Relevant Orders   Lipid panel (Completed)   Vitamin D deficiency    Repeat vit.D: resolved      Relevant Orders   Vitamin D (25 hydroxy) (Completed)    Follow-up: Return in about 3 months (around 02/27/2022) for DM.  Wilfred Lacy, NP

## 2021-11-30 ENCOUNTER — Other Ambulatory Visit: Payer: Self-pay | Admitting: Nurse Practitioner

## 2021-11-30 DIAGNOSIS — Z1231 Encounter for screening mammogram for malignant neoplasm of breast: Secondary | ICD-10-CM

## 2021-12-03 ENCOUNTER — Other Ambulatory Visit: Payer: Self-pay | Admitting: Nurse Practitioner

## 2021-12-03 MED ORDER — METFORMIN HCL ER 500 MG PO TB24: 1000.0000 mg | ORAL_TABLET | Freq: Every day | ORAL | 1 refills | Status: DC

## 2021-12-03 MED ORDER — PRAVASTATIN SODIUM 20 MG PO TABS: 20.0000 mg | ORAL_TABLET | Freq: Every day | ORAL | 3 refills | Status: DC

## 2021-12-13 ENCOUNTER — Ambulatory Visit
Admission: RE | Admit: 2021-12-13 | Discharge: 2021-12-13 | Disposition: A | Discharge status: Home / Self Care | Payer: BC Managed Care – PPO | Source: Ambulatory Visit

## 2021-12-13 DIAGNOSIS — Z1231 Encounter for screening mammogram for malignant neoplasm of breast: Secondary | ICD-10-CM

## 2022-02-04 DIAGNOSIS — L910 Hypertrophic scar: Secondary | ICD-10-CM | POA: Diagnosis not present

## 2022-02-04 DIAGNOSIS — D485 Neoplasm of uncertain behavior of skin: Secondary | ICD-10-CM | POA: Diagnosis not present

## 2022-02-25 DIAGNOSIS — L910 Hypertrophic scar: Secondary | ICD-10-CM | POA: Diagnosis not present

## 2022-02-28 ENCOUNTER — Ambulatory Visit: Payer: BC Managed Care – PPO | Admitting: Nurse Practitioner

## 2022-02-28 ENCOUNTER — Encounter: Payer: Self-pay | Admitting: Nurse Practitioner

## 2022-02-28 VITALS — BP 110/66 | HR 77 | Temp 97.5°F | Ht 64.5 in | Wt 148.4 lb

## 2022-02-28 DIAGNOSIS — E1165 Type 2 diabetes mellitus with hyperglycemia: Secondary | ICD-10-CM

## 2022-02-28 LAB — BASIC METABOLIC PANEL
BUN: 11 mg/dL (ref 6–23)
CO2: 29 mEq/L (ref 19–32)
Calcium: 10.4 mg/dL (ref 8.4–10.5)
Chloride: 100 mEq/L (ref 96–112)
Creatinine, Ser: 0.74 mg/dL (ref 0.40–1.20)
GFR: 92.51 mL/min (ref >60.00)
Glucose, Bld: 103 mg/dL — ABNORMAL HIGH (ref 70–99)
Potassium: 5 mEq/L (ref 3.5–5.1)
Sodium: 139 mEq/L (ref 135–145)

## 2022-02-28 LAB — HEMOGLOBIN A1C: Hgb A1c MFr Bld: 6.7 % — ABNORMAL HIGH (ref 4.6–6.5)

## 2022-02-28 MED ORDER — METFORMIN HCL ER 500 MG PO TB24: 1000.0000 mg | ORAL_TABLET | Freq: Every day | ORAL | 3 refills | Status: DC

## 2022-02-28 NOTE — Assessment & Plan Note (Signed)
Home fasting glucose: 89-116. ?Denies any adverse effects with metformin and pravastatin ?BP at goal ?BP Readings from Last 3 Encounters:  ?02/28/22 110/66  ?11/29/21 100/60  ?08/24/21 125/79  ? ?Repeat BMP and hgbA1ctoday ?Maintain current med doses ?

## 2022-02-28 NOTE — Progress Notes (Signed)
? ?             Established Patient Visit ? ?Patient: Sandra Booker   DOB: 1971/11/13   51 y.o. Female  MRN: [id number] ?Visit Date: 02/28/2022 ? ?Subjective:  ?  ?Chief Complaint  ?Patient presents with  ? Follow-up  ?  3 month f/u on DM  ?Pt checks blood sugars at home and state they range between 89-116  ? ?HPI ?Type 2 diabetes mellitus ([place]) ?Home fasting glucose: 89-116. ?Denies any adverse effects with metformin and pravastatin ?BP at goal ?BP Readings from Last 3 Encounters:  ?02/28/22 110/66  ?11/29/21 100/60  ?08/24/21 125/79  ? ?Repeat BMP and hgbA1ctoday ?Maintain current med doses ? ?Reviewed medical, surgical, and social history today ? ?Medications: ?Outpatient Medications Prior to Visit  ?Medication Sig  ? acetaminophen (TYLENOL) 500 MG tablet Take 500 mg by mouth every 6 (six) hours as needed.  ? cyclobenzaprine (FLEXERIL) 5 MG tablet TAKE 1 TO 2 TABLETS BY MOUTH AT BEDTIME FOR BACK PAIN OR SPASMS  ? diclofenac Sodium (VOLTAREN) 1 % GEL Apply 2 g topically 4 (four) times daily.  ? diphenhydrAMINE (BENADRYL) 25 mg capsule Take 25 mg by mouth every 8 (eight) hours as needed.  ? hyoscyamine (LEVSIN) 0.125 MG tablet TAKE 1 TABLET BY MOUTH EVERY 6 HOURS AS NEEDED FOR CRAMPING  ? levonorgestrel (MIRENA) 20 MCG/24HR IUD 1 each by Intrauterine route once.  ? Multiple Vitamin (MULTIVITAMIN) tablet Take 1 tablet by mouth daily.  ? nortriptyline (PAMELOR) 10 MG capsule Start nortriptyline '10mg'$  at bedtime for 2 week, then increase to 2 tablet at bedtime  ? pantoprazole (PROTONIX) 40 MG tablet TAKE 1 TABLET(40 MG) BY MOUTH DAILY  ? pravastatin (PRAVACHOL) 20 MG tablet Take 1 tablet (20 mg total) by mouth daily. TAKE 1 TABLET(10 MG) BY MOUTH DAILY  ? [DISCONTINUED] metFORMIN (GLUCOPHAGE-XR) 500 MG 24 hr tablet Take 2 tablets (1,000 mg total) by mouth daily with breakfast.  ? ?No facility-administered medications prior to visit.  ? ?Reviewed past medical and social history.  ? ?ROS per HPI above ? ? ?    ?Objective:  ?BP 110/66 (BP Location: Left Arm, Patient Position: Sitting, Cuff Size: Normal)   Pulse 77   Temp (!) 97.5 ?F (36.4 ?C) (Temporal)   Ht 5' 4.5" (1.638 m)   Wt 148 lb 6.4 oz (67.3 kg)   SpO2 98%   BMI 25.08 kg/m?  ? ?  ? ?Physical Exam ?Cardiovascular:  ?   Rate and Rhythm: Normal rate and regular rhythm.  ?   Pulses: Normal pulses.  ?   Heart sounds: Normal heart sounds.  ?Pulmonary:  ?   Effort: Pulmonary effort is normal.  ?   Breath sounds: Normal breath sounds.  ?Neurological:  ?   Mental Status: She is alert and oriented to person, place, and time.  ?  ?No results found for any visits on 02/28/22. ?   ?Assessment & Plan:  ?  ?Problem List Items Addressed This Visit   ? ?  ? Endocrine  ? Type 2 diabetes mellitus ([name]) - Primary  ?  Home fasting glucose: 89-116. ?Denies any adverse effects with metformin and pravastatin ?BP at goal ?BP Readings from Last 3 Encounters:  ?02/28/22 110/66  ?11/29/21 100/60  ?08/24/21 125/79  ? ?Repeat BMP and hgbA1ctoday ?Maintain current med doses ?  ?  ? Relevant Medications  ? metFORMIN (GLUCOPHAGE-XR) 500 MG 24 hr tablet  ? Other Relevant Orders  ? Basic metabolic panel  ?  Hemoglobin A1c  ? ?Return in about 6 months (around 08/30/2022) for CPE (fasting). ? ?  ? ?Wilfred Lacy, NP ? ? ?

## 2022-02-28 NOTE — Patient Instructions (Addendum)
Go to lab for blood draw ?Maintain current medications ?Sign medical release to get records from GYN ?

## 2022-05-11 ENCOUNTER — Other Ambulatory Visit: Payer: Self-pay | Admitting: Nurse Practitioner

## 2022-05-11 DIAGNOSIS — K219 Gastro-esophageal reflux disease without esophagitis: Secondary | ICD-10-CM

## 2022-05-11 NOTE — Telephone Encounter (Signed)
Chart Supports Rx Last OV: 02/2022 Next OV: 08/2022 

## 2022-06-01 ENCOUNTER — Encounter: Payer: Self-pay | Admitting: Nurse Practitioner

## 2022-06-23 ENCOUNTER — Encounter: Payer: Self-pay | Admitting: Nurse Practitioner

## 2022-06-23 ENCOUNTER — Ambulatory Visit: Payer: BC Managed Care – PPO | Admitting: Nurse Practitioner

## 2022-06-23 VITALS — BP 118/78 | HR 87 | Temp 98.5°F | Ht 64.5 in | Wt 150.8 lb

## 2022-06-23 DIAGNOSIS — M545 Low back pain, unspecified: Secondary | ICD-10-CM

## 2022-06-23 DIAGNOSIS — E1169 Type 2 diabetes mellitus with other specified complication: Secondary | ICD-10-CM | POA: Diagnosis not present

## 2022-06-23 DIAGNOSIS — E785 Hyperlipidemia, unspecified: Secondary | ICD-10-CM

## 2022-06-23 DIAGNOSIS — G8928 Other chronic postprocedural pain: Secondary | ICD-10-CM

## 2022-06-23 DIAGNOSIS — K219 Gastro-esophageal reflux disease without esophagitis: Secondary | ICD-10-CM

## 2022-06-23 DIAGNOSIS — E1165 Type 2 diabetes mellitus with hyperglycemia: Secondary | ICD-10-CM

## 2022-06-23 DIAGNOSIS — G8929 Other chronic pain: Secondary | ICD-10-CM

## 2022-06-23 DIAGNOSIS — K580 Irritable bowel syndrome with diarrhea: Secondary | ICD-10-CM | POA: Diagnosis not present

## 2022-06-23 LAB — POCT GLYCOSYLATED HEMOGLOBIN (HGB A1C): Hemoglobin A1C: 7 % — AB (ref 4.0–5.6)

## 2022-06-23 MED ORDER — PRAVASTATIN SODIUM 20 MG PO TABS: 20.0000 mg | ORAL_TABLET | Freq: Every day | ORAL | 3 refills | Status: DC

## 2022-06-23 MED ORDER — CYCLOBENZAPRINE HCL 5 MG PO TABS: 5.0000 mg | ORAL_TABLET | Freq: Every evening | ORAL | 5 refills | Status: DC | PRN

## 2022-06-23 MED ORDER — HYOSCYAMINE SULFATE 0.125 MG PO TABS: ORAL_TABLET | ORAL | 5 refills | Status: DC

## 2022-06-23 MED ORDER — PANTOPRAZOLE SODIUM 40 MG PO TBEC: 40.0000 mg | DELAYED_RELEASE_TABLET | Freq: Every day | ORAL | 3 refills | Status: DC

## 2022-06-23 NOTE — Assessment & Plan Note (Signed)
Stable with use of hyoscyamine prn Refill sent

## 2022-06-23 NOTE — Patient Instructions (Addendum)
Cancel October appt and schedule for November.  Do not use nortriptyline and flexeril at same time due to risk of over sedation  hgbA1c at 7%: mild increase from 28month ago Maintain low carb and low fat diet.  Diabetes Mellitus and Nutrition, Adult When you have diabetes, or diabetes mellitus, it is very important to have healthy eating habits because your blood sugar (glucose) levels are greatly affected by what you eat and drink. Eating healthy foods in the right amounts, at about the same times every day, can help you: Manage your blood glucose. Lower your risk of heart disease. Improve your blood pressure. Reach or maintain a healthy weight. What can affect my meal plan? Every person with diabetes is different, and each person has different needs for a meal plan. Your health care provider may recommend that you work with a dietitian to make a meal plan that is best for you. Your meal plan may vary depending on factors such as: The calories you need. The medicines you take. Your weight. Your blood glucose, blood pressure, and cholesterol levels. Your activity level. Other health conditions you have, such as heart or kidney disease. How do carbohydrates affect me? Carbohydrates, also called carbs, affect your blood glucose level more than any other type of food. Eating carbs raises the amount of glucose in your blood. It is important to know how many carbs you can safely have in each meal. This is different for every person. Your dietitian can help you calculate how many carbs you should have at each meal and for each snack. How does alcohol affect me? Alcohol can cause a decrease in blood glucose (hypoglycemia), especially if you use insulin or take certain diabetes medicines by mouth. Hypoglycemia can be a life-threatening condition. Symptoms of hypoglycemia, such as sleepiness, dizziness, and confusion, are similar to symptoms of having too much alcohol. Do not drink alcohol if: Your  health care provider tells you not to drink. You are pregnant, may be pregnant, or are planning to become pregnant. If you drink alcohol: Limit how much you have to: 0-1 drink a day for women. 0-2 drinks a day for men. Know how much alcohol is in your drink. In the [place] one drink equals one 12 oz bottle of beer (355 mL), one 5 oz glass of wine (148 mL), or one 1 oz glass of hard liquor (44 mL). Keep yourself hydrated with water, diet soda, or unsweetened iced tea. Keep in mind that regular soda, juice, and other mixers may contain a lot of sugar and must be counted as carbs. What are tips for following this plan?  Reading food labels Start by checking the serving size on the Nutrition Facts label of packaged foods and drinks. The number of calories and the amount of carbs, fats, and other nutrients listed on the label are based on one serving of the item. Many items contain more than one serving per package. Check the total grams (g) of carbs in one serving. Check the number of grams of saturated fats and trans fats in one serving. Choose foods that have a low amount or none of these fats. Check the number of milligrams (mg) of salt (sodium) in one serving. Most people should limit total sodium intake to less than 2,300 mg per day. Always check the nutrition information of foods labeled as "low-fat" or "nonfat." These foods may be higher in added sugar or refined carbs and should be avoided. Talk to your dietitian to identify your daily  goals for nutrients listed on the label. Shopping Avoid buying canned, pre-made, or processed foods. These foods tend to be high in fat, sodium, and added sugar. Shop around the outside edge of the grocery store. This is where you will most often find fresh fruits and vegetables, bulk grains, fresh meats, and fresh dairy products. Cooking Use low-heat cooking methods, such as baking, instead of high-heat cooking methods, such as deep frying. Cook using healthy  oils, such as olive, canola, or sunflower oil. Avoid cooking with butter, cream, or high-fat meats. Meal planning Eat meals and snacks regularly, preferably at the same times every day. Avoid going long periods of time without eating. Eat foods that are high in fiber, such as fresh fruits, vegetables, beans, and whole grains. Eat 4-6 oz (112-168 g) of lean protein each day, such as lean meat, chicken, fish, eggs, or tofu. One ounce (oz) (28 g) of lean protein is equal to: 1 oz (28 g) of meat, chicken, or fish. 1 egg.  cup (62 g) of tofu. Eat some foods each day that contain healthy fats, such as avocado, nuts, seeds, and fish. What foods should I eat? Fruits Berries. Apples. Oranges. Peaches. Apricots. Plums. Grapes. Mangoes. Papayas. Pomegranates. Kiwi. Cherries. Vegetables Leafy greens, including lettuce, spinach, kale, chard, collard greens, mustard greens, and cabbage. Beets. Cauliflower. Broccoli. Carrots. Green beans. Tomatoes. Peppers. Onions. Cucumbers. Brussels sprouts. Grains Whole grains, such as whole-wheat or whole-grain bread, crackers, tortillas, cereal, and pasta. Unsweetened oatmeal. Quinoa. Brown or wild rice. Meats and other proteins Seafood. Poultry without skin. Lean cuts of poultry and beef. Tofu. Nuts. Seeds. Dairy Low-fat or fat-free dairy products such as milk, yogurt, and cheese. The items listed above may not be a complete list of foods and beverages you can eat and drink. Contact a dietitian for more information. What foods should I avoid? Fruits Fruits canned with syrup. Vegetables Canned vegetables. Frozen vegetables with butter or cream sauce. Grains Refined white flour and flour products such as bread, pasta, snack foods, and cereals. Avoid all processed foods. Meats and other proteins Fatty cuts of meat. Poultry with skin. Breaded or fried meats. Processed meat. Avoid saturated fats. Dairy Full-fat yogurt, cheese, or milk. Beverages Sweetened  drinks, such as soda or iced tea. The items listed above may not be a complete list of foods and beverages you should avoid. Contact a dietitian for more information. Questions to ask a health care provider Do I need to meet with a certified diabetes care and education specialist? Do I need to meet with a dietitian? What number can I call if I have questions? When are the best times to check my blood glucose? Where to find more information: American Diabetes Association: diabetes.org Academy of Nutrition and Dietetics: eatright.[employer] of Diabetes and Digestive and Kidney Diseases: AmenCredit.is Association of Diabetes Care & Education Specialists: diabeteseducator.org Summary It is important to have healthy eating habits because your blood sugar (glucose) levels are greatly affected by what you eat and drink. It is important to use alcohol carefully. A healthy meal plan will help you manage your blood glucose and lower your risk of heart disease. Your health care provider may recommend that you work with a dietitian to make a meal plan that is best for you. This information is not intended to replace advice given to you by your health care provider. Make sure you discuss any questions you have with your health care provider. Document Revised: 06/10/2020 Document Reviewed: 06/10/2020 Elsevier Patient Education  2023 Elsevier Inc.  

## 2022-06-23 NOTE — Assessment & Plan Note (Signed)
LDL at goal.  Continue pravastatin. 

## 2022-06-23 NOTE — Assessment & Plan Note (Addendum)
Right ankle. Use of nortriptyline prn Advised not to combine with flexeril Refill sent

## 2022-06-23 NOTE — Progress Notes (Signed)
Established Patient Visit  Patient: Sandra Booker   DOB: 08/02/1971   51 y.o. Female  MRN: [id number] Visit Date: 06/23/2022  Subjective:    Chief Complaint  Patient presents with   Office Visit    Med check & refill  Has been tolerating flexeril well No concerns    HPI  Type 2 diabetes mellitus ([place]) Negative retinopathy negative nephropathy Negative neuropathy BP at goal Home fasting glucose: 98-120 compliant with metformin '1000mg'$  daily. Repeat hgbA1c: 7% from 6.7% Advised about the necessary dietary modifications and maintain daily exercise F/up in 57month Irritable bowel syndrome Stable with use of hyoscyamine prn Refill sent  Gastroesophageal reflux disease Stable with pantoprazole prn Refill sent  Chronic postoperative pain Right ankle. Use of nortriptyline prn Advised not to combine with flexeril Refill sent  Hyperlipidemia associated with type 2 diabetes mellitus ([place] LDL at goal Continue pravastatin  BP Readings from Last 3 Encounters:  06/23/22 118/78  02/28/22 110/66  11/29/21 100/60    Wt Readings from Last 3 Encounters:  06/23/22 150 lb 12.8 oz (68.4 kg)  02/28/22 148 lb 6.4 oz (67.3 kg)  11/29/21 147 lb 6.4 oz (66.9 kg)    Reviewed medical, surgical, and social history today  Medications: Outpatient Medications Prior to Visit  Medication Sig   acetaminophen (TYLENOL) 500 MG tablet Take 500 mg by mouth every 6 (six) hours as needed.   diclofenac Sodium (VOLTAREN) 1 % GEL Apply 2 g topically 4 (four) times daily.   diphenhydrAMINE (BENADRYL) 25 mg capsule Take 25 mg by mouth every 8 (eight) hours as needed.   levonorgestrel (MIRENA) 20 MCG/24HR IUD 1 each by Intrauterine route once.   metFORMIN (GLUCOPHAGE-XR) 500 MG 24 hr tablet Take 2 tablets (1,000 mg total) by mouth daily with breakfast.   Multiple Vitamin (MULTIVITAMIN) tablet Take 1 tablet by mouth daily.   [DISCONTINUED] cyclobenzaprine (FLEXERIL) 5 MG tablet TAKE 1  TO 2 TABLETS BY MOUTH AT BEDTIME FOR BACK PAIN OR SPASMS   [DISCONTINUED] hyoscyamine (LEVSIN) 0.125 MG tablet TAKE 1 TABLET BY MOUTH EVERY 6 HOURS AS NEEDED FOR CRAMPING   [DISCONTINUED] pantoprazole (PROTONIX) 40 MG tablet TAKE 1 TABLET(40 MG) BY MOUTH DAILY   [DISCONTINUED] pravastatin (PRAVACHOL) 20 MG tablet Take 1 tablet (20 mg total) by mouth daily. TAKE 1 TABLET(10 MG) BY MOUTH DAILY   nortriptyline (PAMELOR) 10 MG capsule Take 1 capsule (10 mg total) by mouth daily as needed for sleep. For R. ankle pain   [DISCONTINUED] nortriptyline (PAMELOR) 10 MG capsule Start nortriptyline '10mg'$  at bedtime for 2 week, then increase to 2 tablet at bedtime (Patient not taking: Reported on 06/23/2022)   No facility-administered medications prior to visit.   Reviewed past medical and social history.   ROS per HPI above      Objective:  BP 118/78 (BP Location: Right Arm, Patient Position: Sitting, Cuff Size: Small)   Pulse 87   Temp 98.5 F (36.9 C) (Temporal)   Ht 5' 4.5" (1.638 m)   Wt 150 lb 12.8 oz (68.4 kg)   SpO2 99%   BMI 25.49 kg/m      Physical Exam Cardiovascular:     Rate and Rhythm: Normal rate and regular rhythm.     Pulses: Normal pulses.     Heart sounds: Normal heart sounds.  Pulmonary:     Effort: Pulmonary effort is normal.     Breath sounds: Normal  breath sounds.  Musculoskeletal:     Right lower leg: No edema.     Left lower leg: No edema.  Neurological:     Mental Status: She is alert and oriented to person, place, and time.     Results for orders placed or performed in visit on 06/23/22  POCT glycosylated hemoglobin (Hb A1C)  Result Value Ref Range   Hemoglobin A1C 7.0 (A) 4.0 - 5.6 %      Assessment & Plan:    Problem List Items Addressed This Visit       Digestive   Gastroesophageal reflux disease    Stable with pantoprazole prn Refill sent      Relevant Medications   pantoprazole (PROTONIX) 40 MG tablet   hyoscyamine (LEVSIN) 0.125 MG tablet    Irritable bowel syndrome    Stable with use of hyoscyamine prn Refill sent      Relevant Medications   pantoprazole (PROTONIX) 40 MG tablet   hyoscyamine (LEVSIN) 0.125 MG tablet     Endocrine   Hyperlipidemia associated with type 2 diabetes mellitus (HCC)    LDL at goal Continue pravastatin      Relevant Medications   pravastatin (PRAVACHOL) 20 MG tablet   Type 2 diabetes mellitus (HCC) - Primary    Negative retinopathy negative nephropathy Negative neuropathy BP at goal Home fasting glucose: 98-120 compliant with metformin '1000mg'$  daily. Repeat hgbA1c: 7% from 6.7% Advised about the necessary dietary modifications and maintain daily exercise F/up in 82month     Relevant Medications   pravastatin (PRAVACHOL) 20 MG tablet   Other Relevant Orders   POCT glycosylated hemoglobin (Hb A1C) (Completed)     Other   Chronic postoperative pain    Right ankle. Use of nortriptyline prn Advised not to combine with flexeril Refill sent      Relevant Medications   nortriptyline (PAMELOR) 10 MG capsule   cyclobenzaprine (FLEXERIL) 5 MG tablet   Other Visit Diagnoses     Chronic bilateral low back pain without sciatica       Relevant Medications   nortriptyline (PAMELOR) 10 MG capsule   cyclobenzaprine (FLEXERIL) 5 MG tablet      Return in about 3 months (around 09/23/2022) for CPE and, DM, hyperlipidemia (fasting).     CWilfred Lacy NP

## 2022-06-23 NOTE — Assessment & Plan Note (Signed)
Stable with pantoprazole prn Refill sent

## 2022-06-23 NOTE — Assessment & Plan Note (Addendum)
Negative retinopathy negative nephropathy Negative neuropathy BP at goal Home fasting glucose: 98-120 compliant with metformin '1000mg'$  daily. Repeat hgbA1c: 7% from 6.7% Advised about the necessary dietary modifications and maintain daily exercise F/up in 34month

## 2022-07-23 ENCOUNTER — Encounter: Payer: Self-pay | Admitting: Nurse Practitioner

## 2022-07-26 ENCOUNTER — Other Ambulatory Visit: Payer: Self-pay | Admitting: Nurse Practitioner

## 2022-07-26 ENCOUNTER — Other Ambulatory Visit: Payer: Self-pay

## 2022-07-26 DIAGNOSIS — G8929 Other chronic pain: Secondary | ICD-10-CM

## 2022-07-26 MED ORDER — NORTRIPTYLINE HCL 10 MG PO CAPS: 10.0000 mg | ORAL_CAPSULE | Freq: Every day | ORAL | 5 refills | Status: DC | PRN

## 2022-08-30 ENCOUNTER — Encounter: Payer: BC Managed Care – PPO | Admitting: Nurse Practitioner

## 2022-09-26 ENCOUNTER — Encounter: Payer: Self-pay | Admitting: Nurse Practitioner

## 2022-09-26 ENCOUNTER — Ambulatory Visit (INDEPENDENT_AMBULATORY_CARE_PROVIDER_SITE_OTHER): Payer: BC Managed Care – PPO | Admitting: Nurse Practitioner

## 2022-09-26 VITALS — BP 112/70 | HR 83 | Temp 97.2°F | Ht 64.5 in | Wt 145.4 lb

## 2022-09-26 DIAGNOSIS — Z23 Encounter for immunization: Secondary | ICD-10-CM

## 2022-09-26 DIAGNOSIS — E1165 Type 2 diabetes mellitus with hyperglycemia: Secondary | ICD-10-CM

## 2022-09-26 DIAGNOSIS — E785 Hyperlipidemia, unspecified: Secondary | ICD-10-CM

## 2022-09-26 DIAGNOSIS — G8928 Other chronic postprocedural pain: Secondary | ICD-10-CM

## 2022-09-26 DIAGNOSIS — Z0001 Encounter for general adult medical examination with abnormal findings: Secondary | ICD-10-CM | POA: Diagnosis not present

## 2022-09-26 DIAGNOSIS — E1169 Type 2 diabetes mellitus with other specified complication: Secondary | ICD-10-CM

## 2022-09-26 LAB — COMPREHENSIVE METABOLIC PANEL
ALT: 11 U/L (ref 0–35)
AST: 15 U/L (ref 0–37)
Albumin: 4.7 g/dL (ref 3.5–5.2)
Alkaline Phosphatase: 71 U/L (ref 39–117)
BUN: 9 mg/dL (ref 6–23)
CO2: 28 mEq/L (ref 19–32)
Calcium: 10.1 mg/dL (ref 8.4–10.5)
Chloride: 101 mEq/L (ref 96–112)
Creatinine, Ser: 0.68 mg/dL (ref 0.40–1.20)
GFR: 99.18 mL/min (ref >60.00)
Glucose, Bld: 107 mg/dL — ABNORMAL HIGH (ref 70–99)
Potassium: 4.2 mEq/L (ref 3.5–5.1)
Sodium: 138 mEq/L (ref 135–145)
Total Bilirubin: 0.3 mg/dL (ref 0.2–1.2)
Total Protein: 7.1 g/dL (ref 6.0–8.3)

## 2022-09-26 LAB — MICROALBUMIN / CREATININE URINE RATIO
Creatinine,U: 23.3 mg/dL
Microalb Creat Ratio: 3 mg/g (ref 0.0–30.0)
Microalb, Ur: <0.7 mg/dL (ref 0.0–1.9)

## 2022-09-26 LAB — LIPID PANEL
Cholesterol: 150 mg/dL (ref 0–200)
HDL: 49.3 mg/dL (ref >39.00)
LDL Cholesterol: 85 mg/dL (ref 0–99)
NonHDL: 100.32
Total CHOL/HDL Ratio: 3
Triglycerides: 75 mg/dL (ref 0.0–149.0)
VLDL: 15 mg/dL (ref 0.0–40.0)

## 2022-09-26 LAB — HEMOGLOBIN A1C: Hgb A1c MFr Bld: 7 % — ABNORMAL HIGH (ref 4.6–6.5)

## 2022-09-26 NOTE — Progress Notes (Signed)
Complete physical exam  Patient: Sandra Booker   DOB: [birth year]   51 y.o. Female  MRN: [id number] Visit Date: 09/26/2022  Subjective:    Chief Complaint  Patient presents with   Annual Exam    CPE Pt fasting  Shingles vaccine given today    Sandra Booker is a 51 y.o. female who presents today for a complete physical exam. She reports consuming a low fat/low carb/low sugar diet.  Exercise: walking daily 25-70mns each day  She generally feels well. She reports sleeping well. She does not have additional problems to discuss today.  Vision:No Dental:Yes STD Screen:No Hx of chickenpox as a child, no hx of herpes zoster outbreak.  Most recent fall risk assessment:    09/26/2022    9:10 AM  Fall Risk   Falls in the past year? 0  Number falls in past yr: 0  Injury with Fall? 0   Most recent depression screenings:    09/26/2022    9:32 AM 08/23/2021    9:31 AM  PHQ 2/9 Scores  PHQ - 2 Score 0 0  PHQ- 9 Score 0 3   HPI  Type 2 diabetes mellitus ([place] glucose: 85-124. No adverse effects with metformin Normal foot exam today Advised to schedule annual DM eye exam.  Repeat CMP, hgbA1c and urine microalbumin Maintain current medications  Hyperlipidemia associated with type 2 diabetes mellitus ([place] Repeat lipid panel Maintain pravastatin dose  Chronic postoperative pain Improved with use of nortriptyline.  Past Medical History:  Diagnosis Date   Abnormal Pap smear    Allergy    Anemia    Anxiety    Arthritis    Cyst of breast    right breast   Diabetes mellitus without complication ([place] [id number]  Type 2   Encounter for insertion of mirena IUD 09/27/2010   Fibroid    GERD (gastroesophageal reflux disease)    H/O cervicitis    H/O menorrhagia    History of allergic rhinitis    History of iron deficiency    History of iron deficiency    History of vitamin D deficiency    Hyperglycemia    on metformin but pt states no MD states DM    Hyperlipidemia    Hypoglycemia    IBS (irritable bowel syndrome)    Neuromuscular disorder (HCC)    nerve damage in right ankle    Pelvic pain in female    h/o   Seizures ([place]    x 1 30 yrs ago with pregnancy - toxemia - none since    UTI (urinary tract infection)    h/o   Past Surgical History:  Procedure Laterality Date   ANKLE SURGERY  06/22/2011   DILATION AND CURETTAGE OF UTERUS  09/29/2005   HERNIA REPAIR     HYSTEROSCOPY  09/29/2005   MYOMECTOMY     Social History   Socioeconomic History   Marital status: Married    Spouse name: Will   Number of children: 2   Years of education: 12+   Highest education level: Not on file  Occupational History   Occupation: TITLE CArmed forces operational officer [employer]  Tobacco Use   Smoking status: Never   Smokeless tobacco: Never  Vaping Use   Vaping Use: Never used  Substance and Sexual Activity   Alcohol use: No   Drug use: No   Sexual activity: Yes    Birth control/protection: I.U.D.  Other Topics Concern  Not on file  Social History Narrative   Lives with her husband and their 2 children, when they are not at school.   She works as an Barista.   Highest level of education: some college   Social Determinants of Radio broadcast assistant Strain: Not on file  Food Insecurity: Not on file  Transportation Needs: Not on file  Physical Activity: Not on file  Stress: Not on file  Social Connections: Not on file  Intimate Partner Violence: Not on file   Family Status  Relation Name Status   Mother Vira Browns Alive   Father Forensic scientist Deceased   Sister  Alive   Sister  Alive   Brother  Alive   Brother  Alive   Daughter  Alive   Son  Alive   MGM  Deceased at age 81   Shindler Deceased   PGM  Deceased   PGF  Deceased   Neg Hx  (Not Specified)   Family History  Problem Relation Age of Onset   Breast cancer Mother 63   Hypertension Mother    Cancer Mother 60       breast    Arthritis Mother    Hypertension Father    Hyperlipidemia Father    Cancer Father        melanoma   Cancer Maternal Grandfather 24       Prostate   Colon cancer Neg Hx    Colon polyps Neg Hx    Esophageal cancer Neg Hx    Rectal cancer Neg Hx    Stomach cancer Neg Hx    Allergies  Allergen Reactions   Dust Mite Extract Other (See Comments)   Jalene Mullet Pollen Allergen Other (See Comments)    Patient Care Team: Quintella Mura, Charlene Brooke, NP as PCP - General (Internal Medicine) Leo Grosser, Seymour Bars, MD (Inactive) as Consulting Physician (Obstetrics and Gynecology)   Medications:   Review of Systems  Constitutional:  Negative for fever.  HENT:  Negative for congestion and sore throat.   Eyes:        Negative for visual changes  Respiratory:  Negative for cough and shortness of breath.   Cardiovascular:  Negative for chest pain, palpitations and leg swelling.  Gastrointestinal:  Negative for blood in stool, constipation and diarrhea.  Genitourinary:  Negative for dysuria, frequency and urgency.  Musculoskeletal:  Negative for myalgias.  Skin:  Negative for rash.  Neurological:  Negative for dizziness and headaches.  Hematological:  Does not bruise/bleed easily.  Psychiatric/Behavioral:  Negative for suicidal ideas. The patient is not nervous/anxious.         Objective:  BP 112/70 (BP Location: Right Arm, Patient Position: Sitting, Cuff Size: Small)   Pulse 83   Temp (!) 97.2 F (36.2 C) (Temporal)   Ht 5' 4.5" (1.638 m)   Wt 145 lb 6.4 oz (66 kg)   SpO2 99%   BMI 24.57 kg/m    Wt Readings from Last 3 Encounters:  09/26/22 145 lb 6.4 oz (66 kg)  06/23/22 150 lb 12.8 oz (68.4 kg)  02/28/22 148 lb 6.4 oz (67.3 kg)    BP Readings from Last 3 Encounters:  09/26/22 112/70  06/23/22 118/78  02/28/22 110/66     Physical Exam Vitals reviewed.  Constitutional:      General: She is not in acute distress.    Appearance: She is well-developed.  HENT:     Right Ear:  Tympanic membrane, ear canal  and external ear normal.     Left Ear: Tympanic membrane, ear canal and external ear normal.     Nose: Nose normal.  Eyes:     Extraocular Movements: Extraocular movements intact.     Conjunctiva/sclera: Conjunctivae normal.     Pupils: Pupils are equal, round, and reactive to light.  Cardiovascular:     Rate and Rhythm: Normal rate and regular rhythm.     Pulses: Normal pulses.     Heart sounds: Normal heart sounds.  Pulmonary:     Effort: Pulmonary effort is normal. No respiratory distress.     Breath sounds: Normal breath sounds.  Chest:     Chest wall: No tenderness.  Abdominal:     General: Bowel sounds are normal.     Palpations: Abdomen is soft.  Musculoskeletal:        General: Normal range of motion.     Right lower leg: No edema.     Left lower leg: No edema.  Skin:    General: Skin is warm and dry.  Neurological:     Mental Status: She is alert and oriented to person, place, and time.     Deep Tendon Reflexes: Reflexes are normal and symmetric.  Psychiatric:        Mood and Affect: Mood normal.        Behavior: Behavior normal.        Thought Content: Thought content normal.      No results found for any visits on 09/26/22.    Assessment & Plan:    Routine Health Maintenance and Physical Exam  Immunization History  Administered Date(s) Administered   Fluad Quad(high Dose 65+) 08/23/2021   Influenza Split 09/21/2013, 08/22/2015   Influenza,inj,Quad PF,6+ Mos 08/09/2019   Influenza-Unspecified 08/25/2022   PFIZER(Purple Top)SARS-COV-2 Vaccination 02/06/2020, 03/02/2020, 10/08/2020   Pneumococcal Polysaccharide-23 09/09/2019   Tdap 01/24/2014   Health Maintenance  Topic Date Due   Zoster Vaccines- Shingrix (1 of 2) Never done   OPHTHALMOLOGY EXAM  07/20/2022   Diabetic kidney evaluation - Urine ACR  08/23/2022   COVID-19 Vaccine (4 - Pfizer risk series) 10/12/2022 (Originally 12/03/2020)   Hepatitis C Screening  11/29/2022  (Originally 12/16/1988)   HEMOGLOBIN A1C  12/24/2022   Diabetic kidney evaluation - GFR measurement  03/01/2023   PAP SMEAR-Modifier  03/20/2023   FOOT EXAM  09/27/2023   MAMMOGRAM  12/14/2023   TETANUS/TDAP  01/25/2024   COLONOSCOPY (Pts 45-24yr Insurance coverage will need to be confirmed)  09/12/2026   INFLUENZA VACCINE  Completed   HIV Screening  Completed   HPV VACCINES  Aged Out   Discussed health benefits of physical activity, and encouraged her to engage in regular exercise appropriate for her age and condition.  Problem List Items Addressed This Visit       Endocrine   Hyperlipidemia associated with type 2 diabetes mellitus ([place]    Repeat lipid panel Maintain pravastatin dose      Relevant Orders   Lipid panel   Type 2 diabetes mellitus ([place]    Home glucose: 85-124. No adverse effects with metformin Normal foot exam today Advised to schedule annual DM eye exam.  Repeat CMP, hgbA1c and urine microalbumin Maintain current medications      Relevant Orders   Hemoglobin A1c   Urine microalbumin-creatinine with uACR     Other   Chronic postoperative pain    Improved with use of nortriptyline.      Other Visit Diagnoses  Encounter for preventative adult health care exam with abnormal findings    -  Primary   Relevant Orders   Comprehensive metabolic panel   Need for shingles vaccine       Relevant Orders   Zoster Recombinant (Shingrix )      Return in about 6 months (around 03/27/2023) for DM, HTN, hyperlipidemia (fasting).     Wilfred Lacy, NP

## 2022-09-26 NOTE — Assessment & Plan Note (Signed)
Repeat lipid panel Maintain pravastatin dose

## 2022-09-26 NOTE — Assessment & Plan Note (Signed)
Improved with use of nortriptyline.

## 2022-09-26 NOTE — Assessment & Plan Note (Signed)
Home glucose: 85-124. No adverse effects with metformin Normal foot exam today Advised to schedule annual DM eye exam.  Repeat CMP, hgbA1c and urine microalbumin Maintain current medications

## 2022-09-26 NOTE — Patient Instructions (Signed)
Go to lab Maintain current medications.  Preventive Care 59-53 Years Old, Female Preventive care refers to lifestyle choices and visits with your health care provider that can promote health and wellness. Preventive care visits are also called wellness exams. What can I expect for my preventive care visit? Counseling Your health care provider may ask you questions about your: Medical history, including: Past medical problems. Family medical history. Pregnancy history. Current health, including: Menstrual cycle. Method of birth control. Emotional well-being. Home life and relationship well-being. Sexual activity and sexual health. Lifestyle, including: Alcohol, nicotine or tobacco, and drug use. Access to firearms. Diet, exercise, and sleep habits. Work and work Statistician. Sunscreen use. Safety issues such as seatbelt and bike helmet use. Physical exam Your health care provider will check your: Height and weight. These may be used to calculate your BMI (body mass index). BMI is a measurement that tells if you are at a healthy weight. Waist circumference. This measures the distance around your waistline. This measurement also tells if you are at a healthy weight and may help predict your risk of certain diseases, such as type 2 diabetes and high blood pressure. Heart rate and blood pressure. Body temperature. Skin for abnormal spots. What immunizations do I need?  Vaccines are usually given at various ages, according to a schedule. Your health care provider will recommend vaccines for you based on your age, medical history, and lifestyle or other factors, such as travel or where you work. What tests do I need? Screening Your health care provider may recommend screening tests for certain conditions. This may include: Lipid and cholesterol levels. Diabetes screening. This is done by checking your blood sugar (glucose) after you have not eaten for a while (fasting). Pelvic exam  and Pap test. Hepatitis B test. Hepatitis C test. HIV (human immunodeficiency virus) test. STI (sexually transmitted infection) testing, if you are at risk. Lung cancer screening. Colorectal cancer screening. Mammogram. Talk with your health care provider about when you should start having regular mammograms. This may depend on whether you have a family history of breast cancer. BRCA-related cancer screening. This may be done if you have a family history of breast, ovarian, tubal, or peritoneal cancers. Bone density scan. This is done to screen for osteoporosis. Talk with your health care provider about your test results, treatment options, and if necessary, the need for more tests. Follow these instructions at home: Eating and drinking  Eat a diet that includes fresh fruits and vegetables, whole grains, lean protein, and low-fat dairy products. Take vitamin and mineral supplements as recommended by your health care provider. Do not drink alcohol if: Your health care provider tells you not to drink. You are pregnant, may be pregnant, or are planning to become pregnant. If you drink alcohol: Limit how much you have to 0-1 drink a day. Know how much alcohol is in your drink. In the [place] one drink equals one 12 oz bottle of beer (355 mL), one 5 oz glass of wine (148 mL), or one 1 oz glass of hard liquor (44 mL). Lifestyle Brush your teeth every morning and night with fluoride toothpaste. Floss one time each day. Exercise for at least 30 minutes 5 or more days each week. Do not use any products that contain nicotine or tobacco. These products include cigarettes, chewing tobacco, and vaping devices, such as e-cigarettes. If you need help quitting, ask your health care provider. Do not use drugs. If you are sexually active, practice safe sex. Use a  condom or other form of protection to prevent STIs. If you do not wish to become pregnant, use a form of birth control. If you plan to become  pregnant, see your health care provider for a prepregnancy visit. Take aspirin only as told by your health care provider. Make sure that you understand how much to take and what form to take. Work with your health care provider to find out whether it is safe and beneficial for you to take aspirin daily. Find healthy ways to manage stress, such as: Meditation, yoga, or listening to music. Journaling. Talking to a trusted person. Spending time with friends and family. Minimize exposure to UV radiation to reduce your risk of skin cancer. Safety Always wear your seat belt while driving or riding in a vehicle. Do not drive: If you have been drinking alcohol. Do not ride with someone who has been drinking. When you are tired or distracted. While texting. If you have been using any mind-altering substances or drugs. Wear a helmet and other protective equipment during sports activities. If you have firearms in your house, make sure you follow all gun safety procedures. Seek help if you have been physically or sexually abused. What's next? Visit your health care provider once a year for an annual wellness visit. Ask your health care provider how often you should have your eyes and teeth checked. Stay up to date on all vaccines. This information is not intended to replace advice given to you by your health care provider. Make sure you discuss any questions you have with your health care provider. Document Revised: 05/05/2021 Document Reviewed: 05/05/2021 Elsevier Patient Education  [place].

## 2022-10-06 DIAGNOSIS — B977 Papillomavirus as the cause of diseases classified elsewhere: Secondary | ICD-10-CM | POA: Diagnosis not present

## 2022-10-06 DIAGNOSIS — Z01419 Encounter for gynecological examination (general) (routine) without abnormal findings: Secondary | ICD-10-CM | POA: Diagnosis not present

## 2022-10-06 LAB — HM PAP SMEAR

## 2022-11-28 ENCOUNTER — Other Ambulatory Visit: Payer: Self-pay | Admitting: Nurse Practitioner

## 2022-11-28 DIAGNOSIS — Z1231 Encounter for screening mammogram for malignant neoplasm of breast: Secondary | ICD-10-CM

## 2023-01-16 ENCOUNTER — Ambulatory Visit
Admission: RE | Admit: 2023-01-16 | Discharge: 2023-01-16 | Disposition: A | Discharge status: Home / Self Care | Payer: BC Managed Care – PPO | Source: Ambulatory Visit | Attending: Nurse Practitioner | Admitting: Nurse Practitioner

## 2023-01-16 DIAGNOSIS — Z1231 Encounter for screening mammogram for malignant neoplasm of breast: Secondary | ICD-10-CM | POA: Diagnosis not present

## 2023-01-19 ENCOUNTER — Other Ambulatory Visit: Payer: Self-pay | Admitting: Nurse Practitioner

## 2023-01-19 DIAGNOSIS — R928 Other abnormal and inconclusive findings on diagnostic imaging of breast: Secondary | ICD-10-CM

## 2023-01-22 ENCOUNTER — Other Ambulatory Visit: Payer: Self-pay | Admitting: Nurse Practitioner

## 2023-01-22 DIAGNOSIS — E1165 Type 2 diabetes mellitus with hyperglycemia: Secondary | ICD-10-CM

## 2023-02-02 ENCOUNTER — Ambulatory Visit: Admission: RE | Admit: 2023-02-02 | Payer: BC Managed Care – PPO | Source: Ambulatory Visit

## 2023-02-02 ENCOUNTER — Ambulatory Visit
Admission: RE | Admit: 2023-02-02 | Discharge: 2023-02-02 | Disposition: A | Discharge status: Home / Self Care | Payer: BC Managed Care – PPO | Source: Ambulatory Visit | Attending: Nurse Practitioner | Admitting: Nurse Practitioner

## 2023-02-02 DIAGNOSIS — R928 Other abnormal and inconclusive findings on diagnostic imaging of breast: Secondary | ICD-10-CM

## 2023-02-02 DIAGNOSIS — N6489 Other specified disorders of breast: Secondary | ICD-10-CM | POA: Diagnosis not present

## 2023-02-28 LAB — HM DIABETES EYE EXAM

## 2023-03-27 ENCOUNTER — Other Ambulatory Visit: Payer: Self-pay | Admitting: Nurse Practitioner

## 2023-03-27 ENCOUNTER — Ambulatory Visit: Payer: BC Managed Care – PPO | Admitting: Nurse Practitioner

## 2023-03-27 ENCOUNTER — Encounter: Payer: Self-pay | Admitting: Nurse Practitioner

## 2023-03-27 VITALS — BP 120/82 | HR 77 | Temp 98.3°F | Resp 16 | Ht 64.5 in | Wt 148.4 lb

## 2023-03-27 DIAGNOSIS — E559 Vitamin D deficiency, unspecified: Secondary | ICD-10-CM | POA: Diagnosis not present

## 2023-03-27 DIAGNOSIS — G4720 Circadian rhythm sleep disorder, unspecified type: Secondary | ICD-10-CM | POA: Diagnosis not present

## 2023-03-27 DIAGNOSIS — K580 Irritable bowel syndrome with diarrhea: Secondary | ICD-10-CM

## 2023-03-27 DIAGNOSIS — F518 Other sleep disorders not due to a substance or known physiological condition: Secondary | ICD-10-CM | POA: Diagnosis not present

## 2023-03-27 DIAGNOSIS — E1165 Type 2 diabetes mellitus with hyperglycemia: Secondary | ICD-10-CM | POA: Diagnosis not present

## 2023-03-27 DIAGNOSIS — Z23 Encounter for immunization: Secondary | ICD-10-CM

## 2023-03-27 DIAGNOSIS — E1169 Type 2 diabetes mellitus with other specified complication: Secondary | ICD-10-CM

## 2023-03-27 DIAGNOSIS — G8929 Other chronic pain: Secondary | ICD-10-CM

## 2023-03-27 DIAGNOSIS — E785 Hyperlipidemia, unspecified: Secondary | ICD-10-CM

## 2023-03-27 DIAGNOSIS — Z7984 Long term (current) use of oral hypoglycemic drugs: Secondary | ICD-10-CM | POA: Diagnosis not present

## 2023-03-27 DIAGNOSIS — M545 Low back pain, unspecified: Secondary | ICD-10-CM

## 2023-03-27 LAB — BASIC METABOLIC PANEL
BUN: 9 mg/dL (ref 6–23)
CO2: 28 mEq/L (ref 19–32)
Calcium: 9.7 mg/dL (ref 8.4–10.5)
Chloride: 99 mEq/L (ref 96–112)
Creatinine, Ser: 0.64 mg/dL (ref 0.40–1.20)
GFR: 100.29 mL/min (ref >60.00)
Glucose, Bld: 96 mg/dL (ref 70–99)
Potassium: 4.5 mEq/L (ref 3.5–5.1)
Sodium: 138 mEq/L (ref 135–145)

## 2023-03-27 LAB — TSH: TSH: 1.54 u[IU]/mL (ref 0.35–5.50)

## 2023-03-27 LAB — VITAMIN D 25 HYDROXY (VIT D DEFICIENCY, FRACTURES): VITD: 35.15 ng/mL (ref 30.00–100.00)

## 2023-03-27 LAB — HEMOGLOBIN A1C: Hgb A1c MFr Bld: 6.6 % — ABNORMAL HIGH (ref 4.6–6.5)

## 2023-03-27 MED ORDER — NORTRIPTYLINE HCL 10 MG PO CAPS: 10.0000 mg | ORAL_CAPSULE | Freq: Every evening | ORAL | 5 refills | Status: AC | PRN

## 2023-03-27 MED ORDER — HYOSCYAMINE SULFATE 0.125 MG PO TABS: ORAL_TABLET | ORAL | 1 refills | Status: DC

## 2023-03-27 NOTE — Progress Notes (Signed)
Established Patient Visit  Patient: Sandra Booker   DOB: 09/05/1971   52 y.o. Female  MRN: [id number] Visit Date: 03/27/2023  Subjective:    Chief Complaint  Patient presents with   Medical Management of Chronic Issues    Fasting Yes    HPI Vitamin D deficiency Muscle fatigue and sleepiness Repeat vit.D  Type 2 diabetes mellitus (HCC) No adverse effects with metformin scheduled annual DM eye exam. LDL at goal with pravastatin Negative UACr  Repeat BMP and hgbA1c  Maintain current medications  Irritable bowel syndrome Stable with use of hyoscyamine prn Refill sent  Hyperlipidemia associated with type 2 diabetes mellitus (HCC) No adverse effects with pravastatin Repeat lipid panel Maintain med dose  Disrupted sleep-wake cycle Has difficulty falling and staying asleep Bedtime 10pm, wakes up at 6am Wakes up several time at night No ETOH, tobacco/nicotine, caffeine consumption.  Advised to use melatonin 5-10mg  or current nortriptyline rx prn for sleep.  BP Readings from Last 3 Encounters:  03/27/23 120/82  09/26/22 112/70  06/23/22 118/78    Wt Readings from Last 3 Encounters:  03/27/23 148 lb 6.4 oz (67.3 kg)  09/26/22 145 lb 6.4 oz (66 kg)  06/23/22 150 lb 12.8 oz (68.4 kg)    Reviewed medical, surgical, and social history today  Medications: Outpatient Medications Prior to Visit  Medication Sig   diclofenac Sodium (VOLTAREN) 1 % GEL Apply 2 g topically 4 (four) times daily.   diphenhydrAMINE (BENADRYL) 25 mg capsule Take 25 mg by mouth every 8 (eight) hours as needed.   levonorgestrel (MIRENA) 20 MCG/24HR IUD 1 each by Intrauterine route once.   metFORMIN (GLUCOPHAGE-XR) 500 MG 24 hr tablet TAKE 2 TABLETS(1000 MG) BY MOUTH DAILY WITH BREAKFAST   Multiple Vitamin (MULTIVITAMIN) tablet Take 1 tablet by mouth daily.   pantoprazole (PROTONIX) 40 MG tablet Take 1 tablet (40 mg total) by mouth daily.   pravastatin (PRAVACHOL) 20 MG tablet Take 1  tablet (20 mg total) by mouth daily. TAKE 1 TABLET(10 MG) BY MOUTH DAILY   [DISCONTINUED] cyclobenzaprine (FLEXERIL) 5 MG tablet Take 1-2 tablets (5-10 mg total) by mouth at bedtime as needed and may repeat dose one time if needed for muscle spasms (for back pain and muscle spasm).   [DISCONTINUED] hyoscyamine (LEVSIN) 0.125 MG tablet TAKE 1 TABLET BY MOUTH EVERY 6 HOURS AS NEEDED FOR CRAMPING   [DISCONTINUED] nortriptyline (PAMELOR) 10 MG capsule Take 1 capsule (10 mg total) by mouth daily as needed for sleep. For R. ankle pain   No facility-administered medications prior to visit.   Reviewed past medical and social history.   ROS per HPI above      Objective:  BP 120/82 (BP Location: Right Arm, Patient Position: Sitting, Cuff Size: Large)   Pulse 77   Temp 98.3 F (36.8 C) (Temporal)   Resp 16   Ht 5' 4.5" (1.638 m)   Wt 148 lb 6.4 oz (67.3 kg)   SpO2 99%   BMI 25.08 kg/m      Physical Exam Neck:     Thyroid: No thyroid mass or thyroid tenderness.  Cardiovascular:     Rate and Rhythm: Normal rate and regular rhythm.     Pulses: Normal pulses.     Heart sounds: Normal heart sounds.  Pulmonary:     Effort: Pulmonary effort is normal.     Breath sounds: Normal breath sounds.  Musculoskeletal:  Cervical back: Normal range of motion and neck supple.  Lymphadenopathy:     Cervical: No cervical adenopathy.  Neurological:     Mental Status: She is alert and oriented to person, place, and time.     Results for orders placed or performed in visit on 03/27/23  HM PAP SMEAR  Result Value Ref Range   HM Pap smear Pap without testing       Assessment & Plan:    Problem List Items Addressed This Visit       Digestive   Irritable bowel syndrome    Stable with use of hyoscyamine prn Refill sent      Relevant Medications   hyoscyamine (LEVSIN) 0.125 MG tablet     Endocrine   Hyperlipidemia associated with type 2 diabetes mellitus (HCC)    No adverse effects with  pravastatin Repeat lipid panel Maintain med dose      Type 2 diabetes mellitus (HCC) - Primary    No adverse effects with metformin scheduled annual DM eye exam. LDL at goal with pravastatin Negative UACr  Repeat BMP and hgbA1c  Maintain current medications      Relevant Orders   Hemoglobin A1c   Basic metabolic panel     Nervous and Auditory   Disrupted sleep-wake cycle    Has difficulty falling and staying asleep Bedtime 10pm, wakes up at 6am Wakes up several time at night No ETOH, tobacco/nicotine, caffeine consumption.  Advised to use melatonin 5-10mg  or current nortriptyline rx prn for sleep.      Relevant Medications   nortriptyline (PAMELOR) 10 MG capsule   Other Relevant Orders   TSH     Other   Vitamin D deficiency    Muscle fatigue and sleepiness Repeat vit.D      Relevant Orders   VITAMIN D 25 Hydroxy (Vit-D Deficiency, Fractures)   Other Visit Diagnoses     Encounter for immunization       Relevant Orders   Varicella-zoster vaccine IM (Completed)   Chronic bilateral low back pain without sciatica       Relevant Medications   nortriptyline (PAMELOR) 10 MG capsule      Return in about 6 months (around 09/27/2023) for DM, hyperlipidemia (fasting).     Alysia Penna, NP

## 2023-03-27 NOTE — Assessment & Plan Note (Signed)
Stable with use of hyoscyamine prn Refill sent 

## 2023-03-27 NOTE — Assessment & Plan Note (Signed)
No adverse effects with metformin scheduled annual DM eye exam. LDL at goal with pravastatin Negative UACr  Repeat BMP and hgbA1c  Maintain current medications

## 2023-03-27 NOTE — Assessment & Plan Note (Signed)
Has difficulty falling and staying asleep Bedtime 10pm, wakes up at [facility] up several time at night No ETOH, tobacco/nicotine, caffeine consumption.  Advised to use melatonin 5-10mg  or current nortriptyline rx prn for sleep.

## 2023-03-27 NOTE — Assessment & Plan Note (Signed)
No adverse effects with pravastatin Repeat lipid panel Maintain med dose

## 2023-03-27 NOTE — Assessment & Plan Note (Signed)
Muscle fatigue and sleepiness Repeat vit.D

## 2023-03-27 NOTE — Patient Instructions (Signed)
Go to lab Try melatonin 5-10mg  or nortriptyline at bedtime as needed for sleep.

## 2023-04-22 ENCOUNTER — Other Ambulatory Visit: Payer: Self-pay | Admitting: Nurse Practitioner

## 2023-04-22 DIAGNOSIS — E1169 Type 2 diabetes mellitus with other specified complication: Secondary | ICD-10-CM

## 2023-07-26 ENCOUNTER — Other Ambulatory Visit: Payer: Self-pay | Admitting: Nurse Practitioner

## 2023-07-26 DIAGNOSIS — K219 Gastro-esophageal reflux disease without esophagitis: Secondary | ICD-10-CM

## 2023-09-27 ENCOUNTER — Encounter: Payer: Self-pay | Admitting: Nurse Practitioner

## 2023-09-27 ENCOUNTER — Ambulatory Visit: Payer: BC Managed Care – PPO | Admitting: Nurse Practitioner

## 2023-09-27 VITALS — BP 107/76 | HR 71 | Temp 98.5°F | Resp 18 | Ht 64.0 in | Wt 144.2 lb

## 2023-09-27 DIAGNOSIS — Z7984 Long term (current) use of oral hypoglycemic drugs: Secondary | ICD-10-CM | POA: Diagnosis not present

## 2023-09-27 DIAGNOSIS — Z0001 Encounter for general adult medical examination with abnormal findings: Secondary | ICD-10-CM

## 2023-09-27 DIAGNOSIS — E785 Hyperlipidemia, unspecified: Secondary | ICD-10-CM

## 2023-09-27 DIAGNOSIS — Z23 Encounter for immunization: Secondary | ICD-10-CM

## 2023-09-27 DIAGNOSIS — E1169 Type 2 diabetes mellitus with other specified complication: Secondary | ICD-10-CM

## 2023-09-27 LAB — RENAL FUNCTION PANEL
Albumin: 4.9 g/dL (ref 3.5–5.2)
BUN: 10 mg/dL (ref 6–23)
CO2: 30 meq/L (ref 19–32)
Calcium: 10.2 mg/dL (ref 8.4–10.5)
Chloride: 99 meq/L (ref 96–112)
Creatinine, Ser: 0.69 mg/dL (ref 0.40–1.20)
GFR: 98.14 mL/min (ref >60.00)
Glucose, Bld: 101 mg/dL — ABNORMAL HIGH (ref 70–99)
Phosphorus: 3.4 mg/dL (ref 2.3–4.6)
Potassium: 4.3 meq/L (ref 3.5–5.1)
Sodium: 138 meq/L (ref 135–145)

## 2023-09-27 LAB — HEPATIC FUNCTION PANEL
ALT: 9 U/L (ref 0–35)
AST: 15 U/L (ref 0–37)
Albumin: 4.9 g/dL (ref 3.5–5.2)
Alkaline Phosphatase: 80 U/L (ref 39–117)
Bilirubin, Direct: 0.1 mg/dL (ref 0.0–0.3)
Total Bilirubin: 0.4 mg/dL (ref 0.2–1.2)
Total Protein: 7.6 g/dL (ref 6.0–8.3)

## 2023-09-27 LAB — LIPID PANEL
Cholesterol: 157 mg/dL (ref 0–200)
HDL: 59.7 mg/dL (ref >39.00)
LDL Cholesterol: 82 mg/dL (ref 0–99)
NonHDL: 97.28
Total CHOL/HDL Ratio: 3
Triglycerides: 76 mg/dL (ref 0.0–149.0)
VLDL: 15.2 mg/dL (ref 0.0–40.0)

## 2023-09-27 LAB — MICROALBUMIN / CREATININE URINE RATIO
Creatinine,U: 13.4 mg/dL
Microalb Creat Ratio: 7.9 mg/g (ref 0.0–30.0)
Microalb, Ur: 1.1 mg/dL (ref 0.0–1.9)

## 2023-09-27 LAB — HEMOGLOBIN A1C: Hgb A1c MFr Bld: 6.8 % — ABNORMAL HIGH (ref 4.6–6.5)

## 2023-09-27 MED ORDER — PRAVASTATIN SODIUM 20 MG PO TABS: 20.0000 mg | ORAL_TABLET | Freq: Every day | ORAL | 3 refills | Status: DC

## 2023-09-27 MED ORDER — METFORMIN HCL ER 500 MG PO TB24: 1000.0000 mg | ORAL_TABLET | Freq: Every day | ORAL | 3 refills | Status: DC

## 2023-09-27 NOTE — Assessment & Plan Note (Signed)
No adverse effects with pravastatin Repeat lipid panel Maintain med dose

## 2023-09-27 NOTE — Patient Instructions (Signed)
Go to lab Continue Heart healthy diet and daily exercise. Maintain current medications. 

## 2023-09-27 NOTE — Assessment & Plan Note (Addendum)
controlled No adverse effects with metformin No Dm retinopathy No neuropathy BP at goal LDL at goal with pravastatin  Repeat BMP, UACr and hgbA1c  Maintain current med dose

## 2023-09-27 NOTE — Progress Notes (Signed)
Complete physical exam  Patient: Sandra Booker   DOB: 1971-03-28   52 y.o. Female  MRN: [id number] Visit Date: 09/27/2023  Subjective:    Chief Complaint  Patient presents with   Annual Exam    And chronic disease f/up   Sandra Booker is a 52 y.o. female who presents today for a complete physical exam. She reports consuming a general, low fat, and low sodium diet.  Walking daily  She generally feels well. She reports sleeping well. She does have additional problems to discuss today.  Vision:Yes Dental:Yes STD Screen:No  BP Readings from Last 3 Encounters:  09/27/23 107/76  03/27/23 120/82  09/26/22 112/70   Wt Readings from Last 3 Encounters:  09/27/23 144 lb 3.2 oz (65.4 kg)  03/27/23 148 lb 6.4 oz (67.3 kg)  09/26/22 145 lb 6.4 oz (66 kg)    Most recent fall risk assessment:    09/27/2023    9:05 AM  Fall Risk   Falls in the past year? 0  Number falls in past yr: 0  Injury with Fall? 0  Risk for fall due to : No Fall Risks  Follow up Falls evaluation completed   Depression screen:Yes - No Depression Most recent depression screenings:    09/27/2023    9:05 AM 03/27/2023    8:39 AM  PHQ 2/9 Scores  PHQ - 2 Score 0 1  PHQ- 9 Score  4    HPI  Type 2 diabetes mellitus (HCC) controlled No adverse effects with metformin No Dm retinopathy No neuropathy BP at goal LDL at goal with pravastatin  Repeat BMP, UACr and hgbA1c  Maintain current med dose  Hyperlipidemia associated with type 2 diabetes mellitus (HCC) No adverse effects with pravastatin Repeat lipid panel Maintain med dose   Past Medical History:  Diagnosis Date   Abnormal Pap smear    Allergy    Anemia    Anxiety    Arthritis    Cyst of breast    right breast   Diabetes mellitus without complication (HCC) 06/2019   Type 2   Encounter for insertion of Mirena IUD 09/27/2010   Fibroid    GERD (gastroesophageal reflux disease)    H/O cervicitis    H/O menorrhagia    History of  allergic rhinitis    History of iron deficiency    History of iron deficiency    History of vitamin D deficiency    Hyperglycemia    on metformin but pt states no MD states DM   Hyperlipidemia    Hypoglycemia    IBS (irritable bowel syndrome)    Neuromuscular disorder (HCC)    nerve damage in right ankle    Pelvic pain in female    h/o   Seizures (HCC)    x 1 30 yrs ago with pregnancy - toxemia - none since    UTI (urinary tract infection)    h/o   Past Surgical History:  Procedure Laterality Date   ANKLE SURGERY  06/22/2011   DILATION AND CURETTAGE OF UTERUS  09/29/2005   HERNIA REPAIR     HYSTEROSCOPY  09/29/2005   MYOMECTOMY     Social History   Socioeconomic History   Marital status: Married    Spouse name: Sandra Booker   Number of children: 2   Years of education: 12+   Highest education level: Associate degree: occupational, Scientist, product/process development, or vocational program  Occupational History   Occupation: TITLE Marine scientist: [employer]  [employer]  Tobacco Use   Smoking status: Never   Smokeless tobacco: Never  Vaping Use   Vaping status: Never Used  Substance and Sexual Activity   Alcohol use: No   Drug use: No   Sexual activity: Yes    Birth control/protection: I.U.D.  Other Topics Concern   Not on file  Social History Narrative   Lives with her husband and their 2 children, when they are not at school.   She works as an Geologist, engineering.   Highest level of education: some college   Social Determinants of Health   Financial Resource Strain: Low Risk  (09/26/2023)   Overall Financial Resource Strain (CARDIA)    Difficulty of Paying Living Expenses: Not hard at all  Food Insecurity: No Food Insecurity (09/26/2023)   Hunger Vital Sign    Worried About Running Out of Food in the Last Year: Never true    Ran Out of Food in the Last Year: Never true  Transportation Needs: No Transportation Needs (09/26/2023)   PRAPARE - Administrator, Civil Service (Medical): No     Lack of Transportation (Non-Medical): No  Physical Activity: Insufficiently Active (09/26/2023)   Exercise Vital Sign    Days of Exercise per Week: 4 days    Minutes of Exercise per Session: 30 min  Stress: No Stress Concern Present (09/26/2023)   [employer] of Occupational Health - Occupational Stress Questionnaire    Feeling of Stress : Only a little  Social Connections: Socially Integrated (09/26/2023)   Social Connection and Isolation Panel [NHANES]    Frequency of Communication with Friends and Family: Three times a week    Frequency of Social Gatherings with Friends and Family: Twice a week    Attends Religious Services: More than 4 times per year    Active Member of Clubs or Organizations: Yes    Attends Engineer, structural: More than 4 times per year    Marital Status: Married  Catering manager Violence: Not on file   Family Status  Relation Name Status   Mother Sandra Booker Alive   Father Sandra Booker Deceased   Sister  Alive   Sister  Alive   Brother  Alive   Brother  Alive   Daughter  Alive   Son  Alive   MGM  Deceased at age 97   MGF Chrissie Noa Henryhand Deceased   PGM  Deceased   PGF  Deceased   Neg Hx  (Not Specified)  No partnership data on file   Family History  Problem Relation Age of Onset   Breast cancer Mother 38   Hypertension Mother    Cancer Mother 70       breast   Arthritis Mother    Hypertension Father    Hyperlipidemia Father    Cancer Father        melanoma   Cancer Maternal Grandfather 28       Prostate   Colon cancer Neg Hx    Colon polyps Neg Hx    Esophageal cancer Neg Hx    Rectal cancer Neg Hx    Stomach cancer Neg Hx    Allergies  Allergen Reactions   Dust Mite Extract Other (See Comments)   Sandra Booker Pollen Allergen Other (See Comments)    Patient Care Team: Sandra Booker, Sandra Gains, NP as PCP - General (Internal Medicine) Pennie Rushing, Maris Berger, MD (Inactive) as Consulting Physician (Obstetrics and  Gynecology)   Medications: Outpatient Medications Prior  to Visit  Medication Sig   cyclobenzaprine (FLEXERIL) 5 MG tablet Take 5 mg by mouth 3 (three) times daily as needed for muscle spasms.   diclofenac Sodium (VOLTAREN) 1 % GEL Apply 2 g topically 4 (four) times daily.   diphenhydrAMINE (BENADRYL) 25 mg capsule Take 25 mg by mouth every 8 (eight) hours as needed.   hyoscyamine (LEVSIN) 0.125 MG tablet TAKE 1 TABLET BY MOUTH EVERY 6 HOURS AS NEEDED FOR CRAMPING   levonorgestrel (MIRENA) 20 MCG/24HR IUD 1 each by Intrauterine route once.   Multiple Vitamin (MULTIVITAMIN) tablet Take 1 tablet by mouth daily.   nortriptyline (PAMELOR) 10 MG capsule Take 1 capsule (10 mg total) by mouth at bedtime as needed for sleep. For R. ankle pain   pantoprazole (PROTONIX) 40 MG tablet TAKE 1 TABLET(40 MG) BY MOUTH DAILY   [DISCONTINUED] metFORMIN (GLUCOPHAGE-XR) 500 MG 24 hr tablet TAKE 2 TABLETS(1000 MG) BY MOUTH DAILY WITH BREAKFAST   [DISCONTINUED] pravastatin (PRAVACHOL) 20 MG tablet TAKE 1 TABLET BY MOUTH DAILY   No facility-administered medications prior to visit.    Review of Systems  Constitutional:  Negative for activity change, appetite change and unexpected weight change.  Respiratory: Negative.    Cardiovascular: Negative.   Gastrointestinal: Negative.   Endocrine: Negative for cold intolerance and heat intolerance.  Genitourinary: Negative.   Musculoskeletal: Negative.   Skin: Negative.   Neurological: Negative.   Hematological: Negative.   Psychiatric/Behavioral:  Negative for behavioral problems, decreased concentration, dysphoric mood, hallucinations, self-injury, sleep disturbance and suicidal ideas. The patient is not nervous/anxious.    Last CBC Lab Results  Component Value Date   WBC 5.4 08/23/2021   HGB 12.3 08/23/2021   HCT 37.6 08/23/2021   MCV 90.7 08/23/2021   MCH 29.9 09/30/2016   RDW 12.8 08/23/2021   PLT 216.0 08/23/2021   Last metabolic panel Lab Results   Component Value Date   GLUCOSE 101 (H) 09/27/2023   NA 138 09/27/2023   K 4.3 09/27/2023   CL 99 09/27/2023   CO2 30 09/27/2023   BUN 10 09/27/2023   CREATININE 0.69 09/27/2023   GFR 98.14 09/27/2023   CALCIUM 10.2 09/27/2023   PHOS 3.4 09/27/2023   PROT 7.6 09/27/2023   ALBUMIN 4.9 09/27/2023   ALBUMIN 4.9 09/27/2023   LABGLOB 2.3 12/16/2019   AGRATIO 2.1 12/16/2019   BILITOT 0.4 09/27/2023   ALKPHOS 80 09/27/2023   AST 15 09/27/2023   ALT 9 09/27/2023   Last lipids Lab Results  Component Value Date   CHOL 157 09/27/2023   HDL 59.70 09/27/2023   LDLCALC 82 09/27/2023   TRIG 76.0 09/27/2023   CHOLHDL 3 09/27/2023   Last hemoglobin A1c Lab Results  Component Value Date   HGBA1C 6.8 (H) 09/27/2023   Last thyroid functions Lab Results  Component Value Date   TSH 1.54 03/27/2023   T4TOTAL 6.0 09/30/2016      Objective:  BP 107/76 (BP Location: Left Arm, Patient Position: Sitting, Cuff Size: Normal)   Pulse 71   Temp 98.5 F (36.9 C) (Temporal)   Resp 18   Ht 5\' 4"  (1.626 m)   Wt 144 lb 3.2 oz (65.4 kg)   SpO2 100%   BMI 24.75 kg/m     Physical Exam Vitals and nursing note reviewed.  Constitutional:      General: She is not in acute distress. HENT:     Right Ear: Tympanic membrane, ear canal and external ear normal.     Left  Ear: Tympanic membrane, ear canal and external ear normal.     Nose: Nose normal.  Eyes:     Extraocular Movements: Extraocular movements intact.     Conjunctiva/sclera: Conjunctivae normal.     Pupils: Pupils are equal, round, and reactive to light.  Neck:     Thyroid: No thyroid mass, thyromegaly or thyroid tenderness.  Cardiovascular:     Rate and Rhythm: Normal rate and regular rhythm.     Pulses: Normal pulses.     Heart sounds: Normal heart sounds.  Pulmonary:     Effort: Pulmonary effort is normal.     Breath sounds: Normal breath sounds.  Abdominal:     General: Bowel sounds are normal.     Palpations: Abdomen is  soft.  Musculoskeletal:        General: Normal range of motion.     Cervical back: Normal range of motion and neck supple.     Right lower leg: No edema.     Left lower leg: No edema.  Lymphadenopathy:     Cervical: No cervical adenopathy.  Skin:    General: Skin is warm and dry.  Neurological:     Mental Status: She is alert and oriented to person, place, and time.     Cranial Nerves: No cranial nerve deficit.  Psychiatric:        Mood and Affect: Mood normal.        Behavior: Behavior normal.        Thought Content: Thought content normal.      Results for orders placed or performed in visit on 09/27/23  Microalbumin / creatinine urine ratio  Result Value Ref Range   Microalb, Ur 1.1 0.0 - 1.9 mg/dL   Creatinine,U 62.1 mg/dL   Microalb Creat Ratio 7.9 0.0 - 30.0 mg/g  Renal Function Panel  Result Value Ref Range   Sodium 138 135 - 145 mEq/L   Potassium 4.3 3.5 - 5.1 mEq/L   Chloride 99 96 - 112 mEq/L   CO2 30 19 - 32 mEq/L   Albumin 4.9 3.5 - 5.2 g/dL   BUN 10 6 - 23 mg/dL   Creatinine, Ser 3.08 0.40 - 1.20 mg/dL   Glucose, Bld 657 (H) 70 - 99 mg/dL   Phosphorus 3.4 2.3 - 4.6 mg/dL   GFR 84.69 >62.95 mL/min   Calcium 10.2 8.4 - 10.5 mg/dL  Hepatic function panel  Result Value Ref Range   Total Bilirubin 0.4 0.2 - 1.2 mg/dL   Bilirubin, Direct 0.1 0.0 - 0.3 mg/dL   Alkaline Phosphatase 80 39 - 117 U/L   AST 15 0 - 37 U/L   ALT 9 0 - 35 U/L   Total Protein 7.6 6.0 - 8.3 g/dL   Albumin 4.9 3.5 - 5.2 g/dL  Lipid panel  Result Value Ref Range   Cholesterol 157 0 - 200 mg/dL   Triglycerides 28.4 0.0 - 149.0 mg/dL   HDL 13.24 >40.10 mg/dL   VLDL 27.2 0.0 - 53.6 mg/dL   LDL Cholesterol 82 0 - 99 mg/dL   Total CHOL/HDL Ratio 3    NonHDL 97.28   Hemoglobin A1c  Result Value Ref Range   Hgb A1c MFr Bld 6.8 (H) 4.6 - 6.5 %      Assessment & Plan:    Routine Health Maintenance and Physical Exam  Immunization History  Administered Date(s) Administered   Fluad  Quad(high Dose 65+) 08/23/2021   Influenza Split 09/21/2013, 08/22/2015   Influenza,inj,Quad PF,6+ Mos  08/09/2019   Influenza-Unspecified 08/25/2022   PFIZER(Purple Top)SARS-COV-2 Vaccination 02/06/2020, 03/02/2020, 10/08/2020   Pneumococcal Polysaccharide-23 09/09/2019   Tdap 01/24/2014   Zoster Recombinant(Shingrix) 09/26/2022, 03/27/2023    Health Maintenance  Topic Date Due   INFLUENZA VACCINE  06/22/2023   COVID-19 Vaccine (4 - 2023-24 season) 02/13/2024 (Originally 07/23/2023)   Hepatitis C Screening  09/26/2024 (Originally 12/16/1988)   DTaP/Tdap/Td (2 - Td or Tdap) 01/25/2024   OPHTHALMOLOGY EXAM  02/28/2024   HEMOGLOBIN A1C  03/26/2024   Diabetic kidney evaluation - eGFR measurement  09/26/2024   Diabetic kidney evaluation - Urine ACR  09/26/2024   FOOT EXAM  09/26/2024   MAMMOGRAM  01/16/2025   Cervical Cancer Screening (HPV/Pap Cotest)  10/06/2025   Colonoscopy  09/12/2026   HIV Screening  Completed   Zoster Vaccines- Shingrix  Completed   HPV VACCINES  Aged Out    Discussed health benefits of physical activity, and encouraged her to engage in regular exercise appropriate for her age and condition.  Problem List Items Addressed This Visit     Hyperlipidemia associated with type 2 diabetes mellitus (HCC)    No adverse effects with pravastatin Repeat lipid panel Maintain med dose      Relevant Medications   metFORMIN (GLUCOPHAGE-XR) 500 MG 24 hr tablet   pravastatin (PRAVACHOL) 20 MG tablet   Other Relevant Orders   Hepatic function panel (Completed)   Lipid panel (Completed)   Type 2 diabetes mellitus (HCC)    controlled No adverse effects with metformin No Dm retinopathy No neuropathy BP at goal LDL at goal with pravastatin  Repeat BMP, UACr and hgbA1c  Maintain current med dose      Relevant Medications   metFORMIN (GLUCOPHAGE-XR) 500 MG 24 hr tablet   pravastatin (PRAVACHOL) 20 MG tablet   Other Relevant Orders   Microalbumin / creatinine urine  ratio (Completed)   Renal Function Panel (Completed)   Hemoglobin A1c (Completed)   Other Visit Diagnoses     Encounter for preventative adult health care exam with abnormal findings    -  Primary   Immunization due       Relevant Orders   Flu Vaccine Trivalent High Dose (Fluad)      Return in about 6 months (around 03/26/2024) for hyperlipidemia (fasting), DM.     Alysia Penna, NP

## 2023-10-15 ENCOUNTER — Other Ambulatory Visit: Payer: Self-pay | Admitting: Nurse Practitioner

## 2023-10-16 ENCOUNTER — Encounter (INDEPENDENT_AMBULATORY_CARE_PROVIDER_SITE_OTHER): Payer: BC Managed Care – PPO | Admitting: Nurse Practitioner

## 2023-10-16 DIAGNOSIS — M545 Low back pain, unspecified: Secondary | ICD-10-CM

## 2023-10-16 DIAGNOSIS — M4126 Other idiopathic scoliosis, lumbar region: Secondary | ICD-10-CM

## 2023-10-18 DIAGNOSIS — M419 Scoliosis, unspecified: Secondary | ICD-10-CM | POA: Insufficient documentation

## 2023-10-18 DIAGNOSIS — M4126 Other idiopathic scoliosis, lumbar region: Secondary | ICD-10-CM

## 2023-10-18 DIAGNOSIS — G8929 Other chronic pain: Secondary | ICD-10-CM | POA: Insufficient documentation

## 2023-10-18 MED ORDER — CYCLOBENZAPRINE HCL 5 MG PO TABS: 5.0000 mg | ORAL_TABLET | Freq: Every evening | ORAL | 0 refills | Status: DC | PRN

## 2023-10-18 NOTE — Assessment & Plan Note (Signed)
Use of flexeril 5mg  prn X-ray lumbar spine 2015: normal

## 2023-10-18 NOTE — Assessment & Plan Note (Signed)
Per x-ray completed 2015: Mild upper thoracic kyphosis.  Mild lower thoracic scoliosis.

## 2023-10-18 NOTE — Telephone Encounter (Signed)
Please see the MyChart message reply(ies) for my assessment and plan.  The patient gave consent for this Medical Advice Message and is aware that it may result in a bill to their insurance company as well as the possibility that this may result in a co-payment or deductible. They are an established patient, but are not seeking medical advice exclusively about a problem treated during an in person or video visit in the last 7 days. I did not recommend an in person or video visit within 7 days of my reply.  I spent a total of 15 minutes cumulative time within 7 days through [employer] Alysia Penna, NP

## 2023-11-28 DIAGNOSIS — Z8742 Personal history of other diseases of the female genital tract: Secondary | ICD-10-CM | POA: Diagnosis not present

## 2023-11-28 DIAGNOSIS — N951 Menopausal and female climacteric states: Secondary | ICD-10-CM | POA: Diagnosis not present

## 2023-11-28 DIAGNOSIS — Z5181 Encounter for therapeutic drug level monitoring: Secondary | ICD-10-CM | POA: Diagnosis not present

## 2023-11-28 DIAGNOSIS — Z09 Encounter for follow-up examination after completed treatment for conditions other than malignant neoplasm: Secondary | ICD-10-CM | POA: Diagnosis not present

## 2023-11-28 DIAGNOSIS — Z01419 Encounter for gynecological examination (general) (routine) without abnormal findings: Secondary | ICD-10-CM | POA: Diagnosis not present

## 2023-11-30 ENCOUNTER — Other Ambulatory Visit: Payer: Self-pay

## 2023-11-30 DIAGNOSIS — L659 Nonscarring hair loss, unspecified: Secondary | ICD-10-CM | POA: Diagnosis not present

## 2023-11-30 DIAGNOSIS — L639 Alopecia areata, unspecified: Secondary | ICD-10-CM | POA: Diagnosis not present

## 2023-12-05 LAB — DERMATOLOGY PATHOLOGY

## 2023-12-07 ENCOUNTER — Ambulatory Visit
Admission: RE | Admit: 2023-12-07 | Discharge: 2023-12-07 | Disposition: A | Discharge status: Home / Self Care | Payer: BC Managed Care – PPO | Source: Ambulatory Visit | Attending: Family Medicine | Admitting: Family Medicine

## 2023-12-07 ENCOUNTER — Ambulatory Visit (INDEPENDENT_AMBULATORY_CARE_PROVIDER_SITE_OTHER): Payer: BC Managed Care – PPO

## 2023-12-07 VITALS — BP 126/84 | HR 104 | Temp 99.9°F | Resp 16

## 2023-12-07 DIAGNOSIS — R051 Acute cough: Secondary | ICD-10-CM

## 2023-12-07 DIAGNOSIS — R059 Cough, unspecified: Secondary | ICD-10-CM | POA: Diagnosis not present

## 2023-12-07 DIAGNOSIS — U071 COVID-19: Secondary | ICD-10-CM | POA: Diagnosis not present

## 2023-12-07 LAB — POC COVID19/FLU A&B COMBO
Covid Antigen, POC: POSITIVE — AB
Influenza A Antigen, POC: NEGATIVE
Influenza B Antigen, POC: NEGATIVE

## 2023-12-07 MED ORDER — PAXLOVID (300/100) 20 X 150 MG & 10 X 100MG PO TBPK: 3.0000 | ORAL_TABLET | Freq: Two times a day (BID) | ORAL | 0 refills | Status: AC

## 2023-12-07 MED ORDER — BENZONATATE 200 MG PO CAPS: 200.0000 mg | ORAL_CAPSULE | Freq: Three times a day (TID) | ORAL | 0 refills | Status: DC | PRN

## 2023-12-07 NOTE — ED Triage Notes (Signed)
Pt states headache,cough,body aches and chills for the past 4 days.  States she is taking OTC cold medicine at home.

## 2023-12-07 NOTE — Discharge Instructions (Signed)
You have tested for for COVID-19.  Start Paxlovid twice daily for 5 days.  Do not take your nortriptyline while on this medication as well as for 5 days after you complete the medication.  This can also interfere with hormonal birth control so please use a backup method while on this medication.  You may take Tessalon as needed for cough.  Lots of rest and fluids.  Please follow-up with your PCP if your symptoms do not improve.  Please go to the emergency room if you develop any worsening symptoms.  I hope you feel better soon!

## 2023-12-07 NOTE — ED Provider Notes (Signed)
[facility]    CSN: [id number] Arrival date & time: 12/07/23  1710      History   Chief Complaint Chief Complaint  Patient presents with   Headache    Cold symptoms - Entered by patient    HPI Sandra Booker is a 53 y.o. female  presents for evaluation of URI symptoms for 4 days. Patient reports associated symptoms of cough, congestion, sore throat, body aches, headache, fever, nausea. Denies V/D, ear pain, shortness of breath . Patient does not have a hx of asthma. Patient is not an active smoker.   Reports mother may have had pneumonia.  Pt has taken Tylenol and cough medicine OTC for symptoms. Pt has no other concerns at this time.    Headache Associated symptoms: congestion, cough, fever, nausea and sore throat     Past Medical History:  Diagnosis Date   Abnormal Pap smear    Allergy    Anemia    Anxiety    Arthritis    Cyst of breast    right breast   Diabetes mellitus without complication (HCC) 06/2019   Type 2   Encounter for insertion of Mirena IUD 09/27/2010   Fibroid    GERD (gastroesophageal reflux disease)    H/O cervicitis    H/O menorrhagia    History of allergic rhinitis    History of iron deficiency    History of iron deficiency    History of vitamin D deficiency    Hyperglycemia    on metformin but pt states no MD states DM   Hyperlipidemia    Hypoglycemia    IBS (irritable bowel syndrome)    Neuromuscular disorder (HCC)    nerve damage in right ankle    Pelvic pain in female    h/o   Seizures (HCC)    x 1 30 yrs ago with pregnancy - toxemia - none since    UTI (urinary tract infection)    h/o    Patient Active Problem List   Diagnosis Date Noted   Scoliosis of thoracic spine 10/18/2023   Chronic bilateral back pain 10/18/2023   Disrupted sleep-wake cycle 03/27/2023   IUD (intrauterine device) in place 08/23/2021   History of ankle surgery 08/23/2021   Hyperlipidemia associated with type 2 diabetes mellitus (HCC)  08/23/2021   Type 2 diabetes mellitus (HCC) 10/29/2020   Vitamin D deficiency 10/29/2020   Gastroesophageal reflux disease 05/26/2020   Chronic postoperative pain 03/01/2018   Fibroid    Pelvic pain in female    ASCUS favor benign    Irritable bowel syndrome    History of allergic rhinitis     Past Surgical History:  Procedure Laterality Date   ANKLE SURGERY  06/22/2011   DILATION AND CURETTAGE OF UTERUS  09/29/2005   HERNIA REPAIR     HYSTEROSCOPY  09/29/2005   MYOMECTOMY      OB History     Gravida  2   Para  2   Term      Preterm      AB      Living  2      SAB      IAB      Ectopic      Multiple      Live Births               Home Medications    Prior to Admission medications   Medication Sig Start Date End Date Taking? Authorizing Provider  benzonatate (TESSALON) 200 MG capsule Take 1 capsule (200 mg total) by mouth 3 (three) times daily as needed. 12/07/23  Yes Radford Pax, NP  nirmatrelvir/ritonavir (PAXLOVID, 300/100,) 20 x 150 MG & 10 x 100MG  TBPK Take 3 tablets by mouth 2 (two) times daily for 5 days. Take nirmatrelvir (150 mg) two tablets twice daily for 5 days and ritonavir (100 mg) one tablet twice daily for 5 days. 12/07/23 12/12/23 Yes Radford Pax, NP  cyclobenzaprine (FLEXERIL) 5 MG tablet Take 1 tablet (5 mg total) by mouth at bedtime as needed for muscle spasms. 10/18/23   Nche, Bonna Gains, NP  diclofenac Sodium (VOLTAREN) 1 % GEL Apply 2 g topically 4 (four) times daily. 08/01/21   Particia Nearing, PA-C  diphenhydrAMINE (BENADRYL) 25 mg capsule Take 25 mg by mouth every 8 (eight) hours as needed.    [provider]  hyoscyamine (LEVSIN) 0.125 MG tablet TAKE 1 TABLET BY MOUTH EVERY 6 HOURS AS NEEDED FOR CRAMPING 03/27/23   Nche, Bonna Gains, NP  levonorgestrel (MIRENA) 20 MCG/24HR IUD 1 each by Intrauterine route once.    [provider]  metFORMIN (GLUCOPHAGE-XR) 500 MG 24 hr tablet Take 2 tablets (1,000 mg  total) by mouth daily with breakfast. 09/27/23   Nche, Bonna Gains, NP  Multiple Vitamin (MULTIVITAMIN) tablet Take 1 tablet by mouth daily.    [provider]  nortriptyline (PAMELOR) 10 MG capsule Take 1 capsule (10 mg total) by mouth at bedtime as needed for sleep. For R. ankle pain 03/27/23   Nche, Bonna Gains, NP  pantoprazole (PROTONIX) 40 MG tablet TAKE 1 TABLET(40 MG) BY MOUTH DAILY 07/26/23   Nche, Bonna Gains, NP  pravastatin (PRAVACHOL) 20 MG tablet Take 1 tablet (20 mg total) by mouth daily. 09/27/23   Nche, Bonna Gains, NP    Family History Family History  Problem Relation Age of Onset   Breast cancer Mother 67   Hypertension Mother    Cancer Mother 58       breast   Arthritis Mother    Hypertension Father    Hyperlipidemia Father    Cancer Father        melanoma   Cancer Maternal Grandfather 66       Prostate   Colon cancer Neg Hx    Colon polyps Neg Hx    Esophageal cancer Neg Hx    Rectal cancer Neg Hx    Stomach cancer Neg Hx     Social History Social History   Tobacco Use   Smoking status: Never   Smokeless tobacco: Never  Vaping Use   Vaping status: Never Used  Substance Use Topics   Alcohol use: No   Drug use: No     Allergies   Dust mite extract and Timothy grass pollen allergen   Review of Systems Review of Systems  Constitutional:  Positive for chills and fever.  HENT:  Positive for congestion and sore throat.   Respiratory:  Positive for cough.   Gastrointestinal:  Positive for nausea.  Neurological:  Positive for headaches.     Physical Exam Triage Vital Signs ED Triage Vitals  Encounter Vitals Group     BP 12/07/23 1726 126/84     Systolic BP Percentile --      Diastolic BP Percentile --      Pulse Rate 12/07/23 1726 (!) 104     Resp 12/07/23 1726 16     Temp 12/07/23 1726 99.9 F (37.7 C)  Temp Source 12/07/23 1726 Oral     SpO2 12/07/23 1726 96 %     Weight --      Height --      Head Circumference --       Peak Flow --      Pain Score 12/07/23 1727 0     Pain Loc --      Pain Education --      Exclude from Growth Chart --    No data found.  Updated Vital Signs BP 126/84 (BP Location: Right Arm)   Pulse (!) 104   Temp 99.9 F (37.7 C) (Oral)   Resp 16   SpO2 96%   Visual Acuity Right Eye Distance:   Left Eye Distance:   Bilateral Distance:    Right Eye Near:   Left Eye Near:    Bilateral Near:     Physical Exam Vitals and nursing note reviewed.  Constitutional:      General: She is not in acute distress.    Appearance: She is well-developed. She is not ill-appearing.  HENT:     Head: Normocephalic and atraumatic.     Right Ear: Tympanic membrane and ear canal normal.     Left Ear: Tympanic membrane and ear canal normal.     Nose: Congestion present.     Mouth/Throat:     Mouth: Mucous membranes are moist.     Pharynx: Oropharynx is clear. Uvula midline. No oropharyngeal exudate or posterior oropharyngeal erythema.     Tonsils: No tonsillar exudate or tonsillar abscesses.  Eyes:     Conjunctiva/sclera: Conjunctivae normal.     Pupils: Pupils are equal, round, and reactive to light.  Cardiovascular:     Rate and Rhythm: Normal rate and regular rhythm.     Heart sounds: Normal heart sounds.  Pulmonary:     Effort: Pulmonary effort is normal.     Breath sounds: Normal breath sounds. No wheezing.  Musculoskeletal:     Cervical back: Normal range of motion and neck supple.  Lymphadenopathy:     Cervical: No cervical adenopathy.  Skin:    General: Skin is warm and dry.  Neurological:     General: No focal deficit present.     Mental Status: She is alert and oriented to person, place, and time.  Psychiatric:        Mood and Affect: Mood normal.        Behavior: Behavior normal.      UC Treatments / Results  Labs (all labs ordered are listed, but only abnormal results are displayed) Labs Reviewed  POC COVID19/FLU A&B COMBO - Abnormal; Notable for the  following components:      Result Value   Covid Antigen, POC Positive (*)    All other components within normal limits   Renal Function Panel Order: [id number]  Status: Final result     Next appt: 03/26/2024 at 08:20 AM in Family Medicine Alysia Penna, NP)     Dx: Type 2 diabetes mellitus with other s...   Test Result Released: Yes (seen)     Messages: Seen   1 Result Note     1 Patient Communication     1 HM Topic          Component Ref Range & Units (hover) 2 mo ago (09/27/23) 2 mo ago (09/27/23) 8 mo ago (03/27/23) 1 yr ago (09/26/22) 1 yr ago (02/28/22) 2 yr ago (08/23/21) 3 yr ago (12/16/19)  Sodium 138  138  138 139 137 138 R  Potassium 4.3  4.5 4.2 5.0 4.0 4.6 R  Chloride 99  99 101 100 102 102 R  CO2 30  28 28 29 26 20  R  Albumin 4.9 4.9  4.7  4.4 4.8 R  BUN 10  9 9 11 8 9  R  Creatinine, Ser 0.69  0.64 0.68 0.74 0.63 0.64 R  Glucose, Bld 101 High   96 107 High  103 High  133 High  85 R  Phosphorus 3.4        GFR 98.14  100.29 CM 99.18 CM 92.51 CM 101.80 CM   Comment: Calculated using the CKD-EPI Creatinine Equation (2021)  Calcium 10.2  9.7 10.1 10.4 9.5 10.0 R  Resulting Agency [place] [facility] [place] [facility] [place] [facility] [place] [facility] [place] [facility] [place] [facility]        Specimen Collected: 09/27/23 09:43 Last Resulted: 09/27/23 14:10    EKG   Radiology No results found.  Procedures Procedures (including critical care time)  Medications Ordered in UC Medications - No data to display  Initial Impression / Assessment and Plan / UC Course  I have reviewed the triage vital signs and the nursing notes.  Pertinent labs & imaging results that were available during my care of the patient were reviewed by me and considered in my medical decision making (see chart for details).     Reviewed exam and symptoms with patient.  No red flags.  Wet read of x-ray without obvious consolidation, will contact for any positive results based on  radiology overread.  Positive rapid COVID.  Patient states she has not had COVID in the past.  Labs reviewed from November 2024, GFR 98.  Medication list reviewed.  Patient is in agreement to start Paxlovid.  Only interaction is with nortriptyline which she only takes as needed for sleep and states she has not taken in 3 weeks.  Advised not to take this medication while on the Paxlovid as well as for 5 days after completion and she verbalized understanding.  Also advised use of backup birth control method while on this medication.  Tessalon as needed for cough.  Discussed CDC guidelines for quarantine.  PCP follow-up if symptoms do not improve.  ER precautions reviewed and patient verbalized understanding. Final Clinical Impressions(s) / UC Diagnoses   Final diagnoses:  Acute cough  COVID-19     Discharge Instructions      You have tested for for COVID-19.  Start Paxlovid twice daily for 5 days.  Do not take your nortriptyline while on this medication as well as for 5 days after you complete the medication.  This can also interfere with hormonal birth control so please use a backup method while on this medication.  You may take Tessalon as needed for cough.  Lots of rest and fluids.  Please follow-up with your PCP if your symptoms do not improve.  Please go to the emergency room if you develop any worsening symptoms.  I hope you feel better soon!     ED Prescriptions     Medication Sig Dispense Auth. Provider   nirmatrelvir/ritonavir (PAXLOVID, 300/100,) 20 x 150 MG & 10 x 100MG  TBPK Take 3 tablets by mouth 2 (two) times daily for 5 days. Take nirmatrelvir (150 mg) two tablets twice daily for 5 days and ritonavir (100 mg) one tablet twice daily for 5 days. 30 tablet Radford Pax, NP   benzonatate (TESSALON) 200 MG capsule Take 1 capsule (200 mg  total) by mouth 3 (three) times daily as needed. 20 capsule Radford Pax, NP      PDMP not reviewed this encounter.   Radford Pax, NP 12/07/23  [phone]

## 2023-12-14 DIAGNOSIS — L6611 Classic lichen planopilaris: Secondary | ICD-10-CM | POA: Diagnosis not present

## 2024-01-08 ENCOUNTER — Other Ambulatory Visit: Payer: Self-pay | Admitting: Nurse Practitioner

## 2024-01-08 DIAGNOSIS — M4126 Other idiopathic scoliosis, lumbar region: Secondary | ICD-10-CM

## 2024-01-08 DIAGNOSIS — G8929 Other chronic pain: Secondary | ICD-10-CM

## 2024-01-10 ENCOUNTER — Other Ambulatory Visit: Payer: Self-pay | Admitting: Nurse Practitioner

## 2024-01-10 DIAGNOSIS — Z1231 Encounter for screening mammogram for malignant neoplasm of breast: Secondary | ICD-10-CM

## 2024-01-19 ENCOUNTER — Ambulatory Visit
Admission: RE | Admit: 2024-01-19 | Discharge: 2024-01-19 | Disposition: A | Discharge status: Home / Self Care | Payer: Self-pay | Source: Ambulatory Visit | Attending: Family Medicine | Admitting: Family Medicine

## 2024-01-19 VITALS — BP 136/90 | HR 99 | Temp 100.8°F | Resp 16

## 2024-01-19 DIAGNOSIS — J40 Bronchitis, not specified as acute or chronic: Secondary | ICD-10-CM | POA: Diagnosis not present

## 2024-01-19 DIAGNOSIS — J329 Chronic sinusitis, unspecified: Secondary | ICD-10-CM

## 2024-01-19 LAB — POC COVID19/FLU A&B COMBO
Covid Antigen, POC: NEGATIVE
Influenza A Antigen, POC: NEGATIVE
Influenza B Antigen, POC: NEGATIVE

## 2024-01-19 MED ORDER — ACETAMINOPHEN 325 MG PO TABS: 650.0000 mg | ORAL_TABLET | Freq: Four times a day (QID) | ORAL | 0 refills | Status: AC | PRN

## 2024-01-19 MED ORDER — CETIRIZINE HCL 10 MG PO TABS: 10.0000 mg | ORAL_TABLET | Freq: Every day | ORAL | 0 refills | Status: AC

## 2024-01-19 MED ORDER — PROMETHAZINE-DM 6.25-15 MG/5ML PO SYRP: 5.0000 mL | ORAL_SOLUTION | Freq: Three times a day (TID) | ORAL | 0 refills | Status: DC | PRN

## 2024-01-19 MED ORDER — PREDNISONE 10 MG PO TABS: 30.0000 mg | ORAL_TABLET | Freq: Every day | ORAL | 0 refills | Status: DC

## 2024-01-19 NOTE — ED Triage Notes (Signed)
 Pt states cough and congestion for the past 3 days.  States this morning she woke up with a headache.  Has been taking delsym and alka seltzer at home.

## 2024-01-19 NOTE — ED Provider Notes (Signed)
 [facility]  Note:  This document was prepared using Conservation officer, historic buildings and may include unintentional dictation errors.  MRN: [id number] DOB: 1971-06-22  Subjective:   Sandra Booker is a 53 y.o. female presenting for 3 day history of sinus congestion, dry cough with chest tightness and heaviness, chills, body aches. No fever, shob, wheezing. No asthma. No smoking of any kind including cigarettes, cigars, vaping, marijuana use.  Last A1c was 6.8% in 09/2023. No insulin.   No current facility-administered medications for this encounter.  Current Outpatient Medications:    benzonatate (TESSALON) 200 MG capsule, Take 1 capsule (200 mg total) by mouth 3 (three) times daily as needed., Disp: 20 capsule, Rfl: 0   cyclobenzaprine (FLEXERIL) 5 MG tablet, Take 1 tablet (5 mg total) by mouth daily as needed for muscle spasms. Do not take at same time with nortriptyline, Disp: 30 tablet, Rfl: 1   diclofenac Sodium (VOLTAREN) 1 % GEL, Apply 2 g topically 4 (four) times daily., Disp: 150 g, Rfl: 2   diphenhydrAMINE (BENADRYL) 25 mg capsule, Take 25 mg by mouth every 8 (eight) hours as needed., Disp: , Rfl:    hyoscyamine (LEVSIN) 0.125 MG tablet, TAKE 1 TABLET BY MOUTH EVERY 6 HOURS AS NEEDED FOR CRAMPING, Disp: 90 tablet, Rfl: 1   levonorgestrel (MIRENA) 20 MCG/24HR IUD, 1 each by Intrauterine route once., Disp: , Rfl:    metFORMIN (GLUCOPHAGE-XR) 500 MG 24 hr tablet, Take 2 tablets (1,000 mg total) by mouth daily with breakfast., Disp: 180 tablet, Rfl: 3   Multiple Vitamin (MULTIVITAMIN) tablet, Take 1 tablet by mouth daily., Disp: , Rfl:    nortriptyline (PAMELOR) 10 MG capsule, Take 1 capsule (10 mg total) by mouth at bedtime as needed for sleep. For R. ankle pain, Disp: 30 capsule, Rfl: 5   pantoprazole (PROTONIX) 40 MG tablet, TAKE 1 TABLET(40 MG) BY MOUTH DAILY, Disp: 90 tablet, Rfl: 3   pravastatin (PRAVACHOL) 20 MG tablet, Take 1 tablet (20 mg total) by  mouth daily., Disp: 90 tablet, Rfl: 3   Allergies  Allergen Reactions   Dust Mite Extract Other (See Comments)   Timothy Grass Pollen Allergen Other (See Comments)    Past Medical History:  Diagnosis Date   Abnormal Pap smear    Allergy    Anemia    Anxiety    Arthritis    Cyst of breast    right breast   Diabetes mellitus without complication (HCC) 06/2019   Type 2   Encounter for insertion of Mirena IUD 09/27/2010   Fibroid    GERD (gastroesophageal reflux disease)    H/O cervicitis    H/O menorrhagia    History of allergic rhinitis    History of iron deficiency    History of iron deficiency    History of vitamin D deficiency    Hyperglycemia    on metformin but pt states no MD states DM   Hyperlipidemia    Hypoglycemia    IBS (irritable bowel syndrome)    Neuromuscular disorder (HCC)    nerve damage in right ankle    Pelvic pain in female    h/o   Seizures (HCC)    x 1 30 yrs ago with pregnancy - toxemia - none since    UTI (urinary tract infection)    h/o     Past Surgical History:  Procedure Laterality Date   ANKLE SURGERY  06/22/2011   DILATION AND CURETTAGE OF UTERUS  09/29/2005  HERNIA REPAIR     HYSTEROSCOPY  09/29/2005   MYOMECTOMY      Family History  Problem Relation Age of Onset   Breast cancer Mother 38   Hypertension Mother    Cancer Mother 9       breast   Arthritis Mother    Hypertension Father    Hyperlipidemia Father    Cancer Father        melanoma   Cancer Maternal Grandfather 34       Prostate   Colon cancer Neg Hx    Colon polyps Neg Hx    Esophageal cancer Neg Hx    Rectal cancer Neg Hx    Stomach cancer Neg Hx     Social History   Tobacco Use   Smoking status: Never   Smokeless tobacco: Never  Vaping Use   Vaping status: Never Used  Substance Use Topics   Alcohol use: No   Drug use: No    ROS   Objective:   Vitals: BP (!) 136/90 (BP Location: Right Arm)   Pulse 99   Temp (!) 100.8 F (38.2 C)  (Oral)   Resp 16   SpO2 95%   Physical Exam Constitutional:      General: She is not in acute distress.    Appearance: Normal appearance. She is well-developed and normal weight. She is not ill-appearing, toxic-appearing or diaphoretic.  HENT:     Head: Normocephalic and atraumatic.     Right Ear: Tympanic membrane, ear canal and external ear normal. No drainage or tenderness. No middle ear effusion. There is no impacted cerumen. Tympanic membrane is not erythematous or bulging.     Left Ear: Tympanic membrane, ear canal and external ear normal. No drainage or tenderness.  No middle ear effusion. There is no impacted cerumen. Tympanic membrane is not erythematous or bulging.     Nose: Nose normal. No congestion or rhinorrhea.     Mouth/Throat:     Mouth: Mucous membranes are moist. No oral lesions.     Pharynx: No pharyngeal swelling, oropharyngeal exudate, posterior oropharyngeal erythema or uvula swelling.     Tonsils: No tonsillar exudate or tonsillar abscesses.  Eyes:     General: No scleral icterus.       Right eye: No discharge.        Left eye: No discharge.     Extraocular Movements: Extraocular movements intact.     Right eye: Normal extraocular motion.     Left eye: Normal extraocular motion.     Conjunctiva/sclera: Conjunctivae normal.  Cardiovascular:     Rate and Rhythm: Normal rate and regular rhythm.     Heart sounds: Normal heart sounds. No murmur heard.    No friction rub. No gallop.  Pulmonary:     Effort: Pulmonary effort is normal. No respiratory distress.     Breath sounds: No stridor. No wheezing, rhonchi or rales.  Chest:     Chest wall: No tenderness.  Musculoskeletal:     Cervical back: Normal range of motion and neck supple.  Lymphadenopathy:     Cervical: No cervical adenopathy.  Skin:    General: Skin is warm and dry.  Neurological:     General: No focal deficit present.     Mental Status: She is alert and oriented to person, place, and time.   Psychiatric:        Mood and Affect: Mood normal.        Behavior: Behavior normal.  Point-of-care testing for COVID and flu negative.  Assessment and Plan :   PDMP not reviewed this encounter.  1. Sinobronchitis    Will manage for sinobronchitis with prednisone, supportive care.  Will defer imaging for now.  Counseled patient on potential for adverse effects with medications prescribed/recommended today, ER and return-to-clinic precautions discussed, patient verbalized understanding.    Wallis Bamberg, [place] 01/19/24 [phone]

## 2024-01-19 NOTE — Discharge Instructions (Signed)
 We will manage this sinobronchitis with steroids and supportive care. For sore throat or cough try using a honey-based tea. Use 3 teaspoons of honey with juice squeezed from half lemon. Place shaved pieces of ginger into 1/2-1 cup of water and warm over stove top. Then mix the ingredients and repeat every 4 hours as needed. Please take Tylenol 650mg  once every 6 hours for fevers, aches and pains. Hydrate very well with at least 2 liters (64 ounces) of water. Eat light meals such as soups (chicken and noodles, chicken wild rice, vegetable).  Do not eat any foods that you are allergic to.  Start an antihistamine like Zyrtec (10mg  daily) for postnasal drainage, sinus congestion.  You can take this together with prednisone and cough syrup.

## 2024-01-23 ENCOUNTER — Ambulatory Visit
Admission: RE | Admit: 2024-01-23 | Discharge: 2024-01-23 | Disposition: A | Discharge status: Home / Self Care | Payer: BC Managed Care – PPO | Source: Ambulatory Visit | Attending: Nurse Practitioner | Admitting: Nurse Practitioner

## 2024-01-23 DIAGNOSIS — Z1231 Encounter for screening mammogram for malignant neoplasm of breast: Secondary | ICD-10-CM | POA: Diagnosis not present

## 2024-02-15 ENCOUNTER — Encounter: Payer: Self-pay | Admitting: Nurse Practitioner

## 2024-02-15 ENCOUNTER — Telehealth: Payer: Self-pay | Admitting: Nurse Practitioner

## 2024-02-15 DIAGNOSIS — E0821 Diabetes mellitus due to underlying condition with diabetic nephropathy: Secondary | ICD-10-CM

## 2024-02-15 DIAGNOSIS — E1169 Type 2 diabetes mellitus with other specified complication: Secondary | ICD-10-CM

## 2024-02-15 MED ORDER — LISINOPRIL 2.5 MG PO TABS: 2.5000 mg | ORAL_TABLET | Freq: Every day | ORAL | 5 refills | Status: DC

## 2024-02-15 NOTE — Telephone Encounter (Signed)
 See note

## 2024-02-15 NOTE — Assessment & Plan Note (Signed)
 corrected urine microalbumin ratio: 30.04mg /g in 2023 and 82.09mg /g in 2024. start lisinopril 2.5mg  daily.  hgbA1c at 6.8% eGFr 57ml/min  repeat urine test in 3months.

## 2024-02-16 ENCOUNTER — Telehealth: Payer: Self-pay

## 2024-02-16 NOTE — Telephone Encounter (Signed)
 Please see 02/15/24 TE for further correspondence.

## 2024-02-16 NOTE — Telephone Encounter (Signed)
 LVM to CB Need to discuss need for lisinopril 2.5mg 

## 2024-02-16 NOTE — Telephone Encounter (Signed)
 02/16/24  9:19 AM Tried calling patient this morning and telephone call was disconnected. I sent a MyChart message to patient

## 2024-02-16 NOTE — Telephone Encounter (Signed)
 Patient returned call to the office. I informed her of comments from [place] about her recent urine microalbumin and medications. Patient asked why her numbers increased and what the medication is for?

## 2024-02-16 NOTE — Telephone Encounter (Signed)
 Forwarding message below; returned missed phone call.

## 2024-02-16 NOTE — Telephone Encounter (Signed)
 Copied from CRM [id number]. Topic: Clinical - Lab/Test Results >> Feb 16, 2024 12:51 PM Florestine Avers wrote: Reason for CRM: Patient states that she was prescribed some new medication (Lisinopril 2.5mg ) yesterday from NP Nche, and she is unsure about what the medication is for. Patient states that she called  trying to get in contact with CMA Terrah.

## 2024-02-16 NOTE — Telephone Encounter (Signed)
Pt returning call, please c/b

## 2024-02-16 NOTE — Telephone Encounter (Signed)
 Spent on the telephone with Sandra Booker today. We discussed the significance of UACr values and need for lisinopril 2.5mg  daily. She verbalized understanding and agreed to start medication.  CN

## 2024-03-14 DIAGNOSIS — L6611 Classic lichen planopilaris: Secondary | ICD-10-CM | POA: Diagnosis not present

## 2024-03-26 ENCOUNTER — Ambulatory Visit: Payer: BC Managed Care – PPO | Admitting: Nurse Practitioner

## 2024-03-26 ENCOUNTER — Encounter: Payer: Self-pay | Admitting: Nurse Practitioner

## 2024-03-26 VITALS — BP 112/76 | HR 80 | Temp 98.3°F | Ht 63.0 in | Wt 145.0 lb

## 2024-03-26 DIAGNOSIS — E1169 Type 2 diabetes mellitus with other specified complication: Secondary | ICD-10-CM | POA: Diagnosis not present

## 2024-03-26 DIAGNOSIS — Z23 Encounter for immunization: Secondary | ICD-10-CM

## 2024-03-26 DIAGNOSIS — K219 Gastro-esophageal reflux disease without esophagitis: Secondary | ICD-10-CM

## 2024-03-26 DIAGNOSIS — Z7984 Long term (current) use of oral hypoglycemic drugs: Secondary | ICD-10-CM | POA: Diagnosis not present

## 2024-03-26 LAB — RENAL FUNCTION PANEL
Albumin: 4.7 g/dL (ref 3.5–5.2)
BUN: 13 mg/dL (ref 6–23)
CO2: 28 meq/L (ref 19–32)
Calcium: 10 mg/dL (ref 8.4–10.5)
Chloride: 98 meq/L (ref 96–112)
Creatinine, Ser: 0.68 mg/dL (ref 0.40–1.20)
GFR: 98.15 mL/min (ref >60.00)
Glucose, Bld: 107 mg/dL — ABNORMAL HIGH (ref 70–99)
Phosphorus: 3.5 mg/dL (ref 2.3–4.6)
Potassium: 4.1 meq/L (ref 3.5–5.1)
Sodium: 136 meq/L (ref 135–145)

## 2024-03-26 LAB — HEMOGLOBIN A1C: Hgb A1c MFr Bld: 7.2 % — ABNORMAL HIGH (ref 4.6–6.5)

## 2024-03-26 MED ORDER — LISINOPRIL 2.5 MG PO TABS
2.5000 mg | ORAL_TABLET | Freq: Every day | ORAL | 1 refills | Status: AC
Start: 2024-03-26 — End: ?

## 2024-03-26 MED ORDER — PANTOPRAZOLE SODIUM 40 MG PO TBEC: DELAYED_RELEASE_TABLET | ORAL | 1 refills | Status: AC

## 2024-03-26 NOTE — Progress Notes (Unsigned)
 Established Patient Visit  Patient: Sandra Booker   DOB: Dec 19, 1970   53 y.o. Female  MRN: [id number] Visit Date: 03/27/2024  Subjective:    Chief Complaint  Patient presents with   Follow-up    6 month f/u    Cough    Still having a cough     Gastroesophageal reflux disease Uncontrolled GERD symptoms with pantoprazole  40mg  every day. Reports daily heartburn and belching. She admits to eating within 2hrs of bedtime. She developed a productive cough-clear mucus post COVID infection 12/2023. Associated with post-nasal drainage. No CP or SOB or wheezing or fever or chills or night sweats. Cough did not get worse with start of lisinopril  59month ago. It is worse at night. Some improvement with promethazine  Dm. No improvement with benzonatate .  Possibly due to uncontrolled GERD and post-nasal drainage. Increase protonix  to 40mg  BID x 2weeks and start zyrtec  10mg  daily. Advised to avoid eating within 2hrs of bedtime Sent oral prednisone  if cough does not improve  Type 2 diabetes mellitus (HCC) Current use of metformin  1000mg  daily No advser effects with ACE-I and statin No neuropathy LDL and BP at goal Requested Dm eye exam  Repeat hgbA1c and BMP today: Increase in HgbA1c: 6.8 to 7.2%.  maintain metformin  dose. Add glipizide 5mg  daily.  Stable renal function F/up in 3months instead of 6months  Reviewed medical, surgical, and social history today  Medications: Outpatient Medications Prior to Visit  Medication Sig   acetaminophen  (TYLENOL ) 325 MG tablet Take 2 tablets (650 mg total) by mouth every 6 (six) hours as needed for moderate pain (pain score 4-6).   cetirizine  (ZYRTEC  ALLERGY) 10 MG tablet Take 1 tablet (10 mg total) by mouth daily.   cyclobenzaprine  (FLEXERIL ) 5 MG tablet Take 1 tablet (5 mg total) by mouth daily as needed for muscle spasms. Do not take at same time with nortriptyline    diclofenac  Sodium (VOLTAREN ) 1 % GEL Apply 2 g topically 4 (four)  times daily.   diphenhydrAMINE (BENADRYL) 25 mg capsule Take 25 mg by mouth every 8 (eight) hours as needed.   hyoscyamine  (LEVSIN) 0.125 MG tablet TAKE 1 TABLET BY MOUTH EVERY 6 HOURS AS NEEDED FOR CRAMPING   levonorgestrel  (MIRENA ) 20 MCG/24HR IUD 1 each by Intrauterine route once.   metFORMIN  (GLUCOPHAGE -XR) 500 MG 24 hr tablet Take 2 tablets (1,000 mg total) by mouth daily with breakfast.   Multiple Vitamin (MULTIVITAMIN) tablet Take 1 tablet by mouth daily.   nortriptyline  (PAMELOR ) 10 MG capsule Take 1 capsule (10 mg total) by mouth at bedtime as needed for sleep. For R. ankle pain   pravastatin  (PRAVACHOL ) 20 MG tablet Take 1 tablet (20 mg total) by mouth daily.   promethazine -dextromethorphan (PROMETHAZINE -DM) 6.25-15 MG/5ML syrup Take 5 mLs by mouth 3 (three) times daily as needed for cough.   [DISCONTINUED] lisinopril  (ZESTRIL ) 2.5 MG tablet Take 1 tablet (2.5 mg total) by mouth daily.   [DISCONTINUED] pantoprazole  (PROTONIX ) 40 MG tablet TAKE 1 TABLET(40 MG) BY MOUTH DAILY   betamethasone dipropionate (DIPROLENE) 0.05 % ointment Apply topically.   [DISCONTINUED] benzonatate  (TESSALON ) 200 MG capsule Take 1 capsule (200 mg total) by mouth 3 (three) times daily as needed. (Patient not taking: Reported on 03/26/2024)   [DISCONTINUED] predniSONE  (DELTASONE ) 10 MG tablet Take 3 tablets (30 mg total) by mouth daily with breakfast. (Patient not taking: Reported on 03/26/2024)   No facility-administered medications prior to visit.  Reviewed past medical and social history.   ROS per HPI above  Last CBC Lab Results  Component Value Date   WBC 5.4 08/23/2021   HGB 12.3 08/23/2021   HCT 37.6 08/23/2021   MCV 90.7 08/23/2021   MCH 29.9 09/30/2016   RDW 12.8 08/23/2021   PLT 216.0 08/23/2021   Last metabolic panel Lab Results  Component Value Date   GLUCOSE 107 (H) 03/26/2024   NA 136 03/26/2024   K 4.1 03/26/2024   CL 98 03/26/2024   CO2 28 03/26/2024   BUN 13 03/26/2024    CREATININE 0.68 03/26/2024   GFR 98.15 03/26/2024   CALCIUM 10.0 03/26/2024   PHOS 3.5 03/26/2024   PROT 7.6 09/27/2023   ALBUMIN 4.7 03/26/2024   LABGLOB 2.3 12/16/2019   AGRATIO 2.1 12/16/2019   BILITOT 0.4 09/27/2023   ALKPHOS 80 09/27/2023   AST 15 09/27/2023   ALT 9 09/27/2023   Last lipids Lab Results  Component Value Date   CHOL 157 09/27/2023   HDL 59.70 09/27/2023   LDLCALC 82 09/27/2023   TRIG 76.0 09/27/2023   CHOLHDL 3 09/27/2023   Last hemoglobin A1c Lab Results  Component Value Date   HGBA1C 7.2 (H) 03/26/2024        Objective:  BP 112/76 (BP Location: Left Arm, Patient Position: Sitting, Cuff Size: Large)   Pulse 80   Temp 98.3 F (36.8 C) (Temporal)   Ht 5\' 3"  (1.6 m)   Wt 145 lb (65.8 kg)   SpO2 100%   BMI 25.69 kg/m      Physical Exam Vitals and nursing note reviewed.  Cardiovascular:     Rate and Rhythm: Normal rate and regular rhythm.     Pulses: Normal pulses.     Heart sounds: Normal heart sounds.  Pulmonary:     Effort: Pulmonary effort is normal.     Breath sounds: Normal breath sounds.  Musculoskeletal:     Right lower leg: No edema.     Left lower leg: No edema.  Neurological:     Mental Status: She is alert and oriented to person, place, and time.     Results for orders placed or performed in visit on 03/26/24  Hemoglobin A1c  Result Value Ref Range   Hgb A1c MFr Bld 7.2 (H) 4.6 - 6.5 %  Renal Function Panel  Result Value Ref Range   Sodium 136 135 - 145 mEq/L   Potassium 4.1 3.5 - 5.1 mEq/L   Chloride 98 96 - 112 mEq/L   CO2 28 19 - 32 mEq/L   Albumin 4.7 3.5 - 5.2 g/dL   BUN 13 6 - 23 mg/dL   Creatinine, Ser 0.45 0.40 - 1.20 mg/dL   Glucose, Bld 409 (H) 70 - 99 mg/dL   Phosphorus 3.5 2.3 - 4.6 mg/dL   GFR 81.19 >14.78 mL/min   Calcium 10.0 8.4 - 10.5 mg/dL      Assessment & Plan:    Problem List Items Addressed This Visit     Gastroesophageal reflux disease   Uncontrolled GERD symptoms with pantoprazole   40mg  every day. Reports daily heartburn and belching. She admits to eating within 2hrs of bedtime. She developed a productive cough-clear mucus post COVID infection 12/2023. Associated with post-nasal drainage. No CP or SOB or wheezing or fever or chills or night sweats. Cough did not get worse with start of lisinopril  56month ago. It is worse at night. Some improvement with promethazine  Dm. No improvement with benzonatate .  Possibly due to uncontrolled GERD and post-nasal drainage. Increase protonix  to 40mg  BID x 2weeks and start zyrtec  10mg  daily. Advised to avoid eating within 2hrs of bedtime Sent oral prednisone  if cough does not improve      Relevant Medications   pantoprazole  (PROTONIX ) 40 MG tablet   Type 2 diabetes mellitus (HCC) - Primary   Current use of metformin  1000mg  daily No advser effects with ACE-I and statin No neuropathy LDL and BP at goal Requested Dm eye exam  Repeat hgbA1c and BMP today: Increase in HgbA1c: 6.8 to 7.2%.  maintain metformin  dose. Add glipizide 5mg  daily.  Stable renal function F/up in 3months instead of 6months      Relevant Medications   lisinopril  (ZESTRIL ) 2.5 MG tablet   glipiZIDE (GLUCOTROL XL) 5 MG 24 hr tablet   Other Relevant Orders   Hemoglobin A1c (Completed)   Renal Function Panel (Completed)   Other Visit Diagnoses       Immunization due       Relevant Orders   Pneumococcal conjugate vaccine 20-valent (Prevnar 20) (Completed)      Return in about 3 months (around 06/26/2024) for DM, hyperlipidemia (fasting).     Kathrene Parents, NP

## 2024-03-26 NOTE — Assessment & Plan Note (Addendum)
 Uncontrolled GERD symptoms with pantoprazole  40mg  every day. Reports daily heartburn and belching. She admits to eating within 2hrs of bedtime. She developed a productive cough-clear mucus post COVID infection 12/2023. Associated with post-nasal drainage. No CP or SOB or wheezing or fever or chills or night sweats. Cough did not get worse with start of lisinopril  48month ago. It is worse at night. Some improvement with promethazine  Dm. No improvement with benzonatate .  Possibly due to uncontrolled GERD and post-nasal drainage. Increase protonix  to 40mg  BID x 2weeks and start zyrtec  10mg  daily. Advised to avoid eating within 2hrs of bedtime Sent oral prednisone  if cough does not improve

## 2024-03-26 NOTE — Patient Instructions (Addendum)
 Maintain zyrtec  10mg  daily Increase pantoprazole  40mg  BID x 2weeks, then decrease to 40mg  in AM before breakfast. Avoid eating within 2hrs of bedtime. Send mychart message if cough does not improve in 2weeks Have eye exam report faxed to me Go to lab

## 2024-03-26 NOTE — Assessment & Plan Note (Signed)
 Current use of metformin  1000mg  daily No advser effects with ACE-I and statin No neuropathy LDL and BP at goal Requested Dm eye exam  Repeat hgbA1c and BMP today: Increase in HgbA1c: 6.8 to 7.2%.  maintain metformin  dose. Add glipizide 5mg  daily.  Stable renal function F/up in 3months instead of 6months

## 2024-03-27 ENCOUNTER — Encounter: Payer: Self-pay | Admitting: Nurse Practitioner

## 2024-03-27 MED ORDER — GLIPIZIDE ER 5 MG PO TB24
5.0000 mg | ORAL_TABLET | Freq: Every day | ORAL | 1 refills | Status: DC
Start: 2024-03-27 — End: 2024-09-26

## 2024-04-01 ENCOUNTER — Encounter: Payer: Self-pay | Admitting: Nurse Practitioner

## 2024-04-15 ENCOUNTER — Encounter: Payer: Self-pay | Admitting: Nurse Practitioner

## 2024-05-30 ENCOUNTER — Ambulatory Visit: Payer: Self-pay | Admitting: Urgent Care

## 2024-05-30 ENCOUNTER — Ambulatory Visit
Admission: RE | Admit: 2024-05-30 | Discharge: 2024-05-30 | Disposition: A | Discharge status: Home / Self Care | Source: Ambulatory Visit | Attending: Family Medicine | Admitting: Family Medicine

## 2024-05-30 ENCOUNTER — Ambulatory Visit (INDEPENDENT_AMBULATORY_CARE_PROVIDER_SITE_OTHER)

## 2024-05-30 VITALS — BP 109/72 | HR 81 | Temp 98.4°F | Resp 20

## 2024-05-30 DIAGNOSIS — J018 Other acute sinusitis: Secondary | ICD-10-CM

## 2024-05-30 DIAGNOSIS — J309 Allergic rhinitis, unspecified: Secondary | ICD-10-CM | POA: Diagnosis not present

## 2024-05-30 DIAGNOSIS — R053 Chronic cough: Secondary | ICD-10-CM | POA: Diagnosis not present

## 2024-05-30 MED ORDER — PROMETHAZINE-DM 6.25-15 MG/5ML PO SYRP: 5.0000 mL | ORAL_SOLUTION | Freq: Three times a day (TID) | ORAL | 0 refills | Status: DC | PRN

## 2024-05-30 MED ORDER — BECLOMETHASONE DIPROP HFA 40 MCG/ACT IN AERB: 1.0000 | INHALATION_SPRAY | Freq: Two times a day (BID) | RESPIRATORY_TRACT | 0 refills | Status: DC

## 2024-05-30 MED ORDER — AMOXICILLIN-POT CLAVULANATE 875-125 MG PO TABS: 1.0000 | ORAL_TABLET | Freq: Two times a day (BID) | ORAL | 0 refills | Status: DC

## 2024-05-30 MED ORDER — ALBUTEROL SULFATE HFA 108 (90 BASE) MCG/ACT IN AERS: 1.0000 | INHALATION_SPRAY | Freq: Four times a day (QID) | RESPIRATORY_TRACT | 0 refills | Status: AC | PRN

## 2024-05-30 NOTE — Discharge Instructions (Addendum)
 I am addressing a sinus infection with the antibiotic amoxicillin /clavulanate.  Please maintain your Zyrtec  daily.  I am also starting you on an inhaled steroid called beclomethasone.  Start taking this daily to try to help control your coughing and chest tightness.  You can use albuterol  inhaler as needed.  Use cough syrup as needed.  Follow-up with your PCP as soon as possible as you will likely need a referral to pulmonology for consultation regarding your lower respiratory symptoms.  I will reach out to you later today for your chest x-ray results.

## 2024-05-30 NOTE — ED Triage Notes (Signed)
 Pt c/o cough since January-states she had covid in Jan and a sinus infection/bronchitis in Feb-NAD-steady gait

## 2024-05-30 NOTE — ED Provider Notes (Addendum)
 [facility]  Note:  This document was prepared using Conservation officer, historic buildings and may include unintentional dictation errors.  MRN: [id number] DOB: 29-Jan-1971  Subjective:   Sandra Booker is a 53 y.o. female presenting for 6 month history of persistent daily cough worse at night, chest tightness. Was seen in January and February of this year for the same. Tested positive for COVID 19 in January. Has had this problem since. Cough was aggravated recently by the use of a Clorox cleaner. No smoking. Was previously told that she was borderline asthmatic many years ago. Has had sinus congestion, sinus drainage for the past 2 weeks. Has had sinus headaches as well. Has history of allergic rhinitis, takes Zyrtec  daily. Has tried multiple otc medications without resolution. Also takes GERD medications daily.   No current facility-administered medications for this encounter.  Current Outpatient Medications:    acetaminophen  (TYLENOL ) 325 MG tablet, Take 2 tablets (650 mg total) by mouth every 6 (six) hours as needed for moderate pain (pain score 4-6)., Disp: 30 tablet, Rfl: 0   betamethasone dipropionate (DIPROLENE) 0.05 % ointment, Apply topically., Disp: , Rfl:    cetirizine  (ZYRTEC  ALLERGY) 10 MG tablet, Take 1 tablet (10 mg total) by mouth daily., Disp: 30 tablet, Rfl: 0   cyclobenzaprine  (FLEXERIL ) 5 MG tablet, Take 1 tablet (5 mg total) by mouth daily as needed for muscle spasms. Do not take at same time with nortriptyline , Disp: 30 tablet, Rfl: 1   diclofenac  Sodium (VOLTAREN ) 1 % GEL, Apply 2 g topically 4 (four) times daily., Disp: 150 g, Rfl: 2   diphenhydrAMINE (BENADRYL) 25 mg capsule, Take 25 mg by mouth every 8 (eight) hours as needed., Disp: , Rfl:    glipiZIDE  (GLUCOTROL  XL) 5 MG 24 hr tablet, Take 1 tablet (5 mg total) by mouth daily with breakfast., Disp: 90 tablet, Rfl: 1   hyoscyamine  (LEVSIN ) 0.125 MG tablet, TAKE 1 TABLET BY MOUTH EVERY 6 HOURS AS  NEEDED FOR CRAMPING, Disp: 90 tablet, Rfl: 1   levonorgestrel  (MIRENA ) 20 MCG/24HR IUD, 1 each by Intrauterine route once., Disp: , Rfl:    lisinopril  (ZESTRIL ) 2.5 MG tablet, Take 1 tablet (2.5 mg total) by mouth daily., Disp: 90 tablet, Rfl: 1   metFORMIN  (GLUCOPHAGE -XR) 500 MG 24 hr tablet, Take 2 tablets (1,000 mg total) by mouth daily with breakfast., Disp: 180 tablet, Rfl: 3   Multiple Vitamin (MULTIVITAMIN) tablet, Take 1 tablet by mouth daily., Disp: , Rfl:    nortriptyline  (PAMELOR ) 10 MG capsule, Take 1 capsule (10 mg total) by mouth at bedtime as needed for sleep. For R. ankle pain, Disp: 30 capsule, Rfl: 5   pantoprazole  (PROTONIX ) 40 MG tablet, Take 1tab in Am and PM x 2weeks, then decrease to 1tab in AM before breakfast, Disp: 180 tablet, Rfl: 1   pravastatin  (PRAVACHOL ) 20 MG tablet, Take 1 tablet (20 mg total) by mouth daily., Disp: 90 tablet, Rfl: 3   promethazine -dextromethorphan (PROMETHAZINE -DM) 6.25-15 MG/5ML syrup, Take 5 mLs by mouth 3 (three) times daily as needed for cough., Disp: 200 mL, Rfl: 0   Allergies  Allergen Reactions   Dust Mite Extract Other (See Comments)   Timothy Grass Pollen Allergen Other (See Comments)    Past Medical History:  Diagnosis Date   Abnormal Pap smear    Allergy    Anemia    Anxiety    Arthritis    Cyst of breast    right breast   Diabetes  mellitus without complication (HCC) 06/2019   Type 2   Encounter for insertion of Mirena  IUD 09/27/2010   Fibroid    GERD (gastroesophageal reflux disease)    H/O cervicitis    H/O menorrhagia    History of allergic rhinitis    History of iron deficiency    History of iron deficiency    History of vitamin D  deficiency    Hyperglycemia    on metformin  but pt states no MD states DM   Hyperlipidemia    Hypoglycemia    IBS (irritable bowel syndrome)    Neuromuscular disorder (HCC)    nerve damage in right ankle    Pelvic pain in female    h/o   Seizures (HCC)    x 1 30 yrs ago with  pregnancy - toxemia - none since    UTI (urinary tract infection)    h/o     Past Surgical History:  Procedure Laterality Date   ANKLE SURGERY  06/22/2011   DILATION AND CURETTAGE OF UTERUS  09/29/2005   HERNIA REPAIR     HYSTEROSCOPY  09/29/2005   MYOMECTOMY      Family History  Problem Relation Age of Onset   Breast cancer Mother 3   Hypertension Mother    Cancer Mother 41       breast   Arthritis Mother    Hypertension Father    Hyperlipidemia Father    Cancer Father        melanoma   Cancer Maternal Grandfather 9       Prostate   Colon cancer Neg Hx    Colon polyps Neg Hx    Esophageal cancer Neg Hx    Rectal cancer Neg Hx    Stomach cancer Neg Hx     Social History   Tobacco Use   Smoking status: Never   Smokeless tobacco: Never  Vaping Use   Vaping status: Never Used  Substance Use Topics   Alcohol use: No   Drug use: No    ROS   Objective:   Vitals: BP 109/72 (BP Location: Right Arm)   Pulse 81   Temp 98.4 F (36.9 C) (Oral)   Resp 20   SpO2 98%   Physical Exam Constitutional:      General: She is not in acute distress.    Appearance: Normal appearance. She is well-developed and normal weight. She is not ill-appearing, toxic-appearing or diaphoretic.  HENT:     Head: Normocephalic and atraumatic.     Right Ear: Tympanic membrane, ear canal and external ear normal. No drainage or tenderness. No middle ear effusion. There is no impacted cerumen. Tympanic membrane is not erythematous or bulging.     Left Ear: Tympanic membrane, ear canal and external ear normal. No drainage or tenderness.  No middle ear effusion. There is no impacted cerumen. Tympanic membrane is not erythematous or bulging.     Nose: Nose normal. No congestion or rhinorrhea.     Mouth/Throat:     Mouth: Mucous membranes are moist. No oral lesions.     Pharynx: No pharyngeal swelling, oropharyngeal exudate, posterior oropharyngeal erythema or uvula swelling.     Tonsils: No  tonsillar exudate or tonsillar abscesses.  Eyes:     General: No scleral icterus.       Right eye: No discharge.        Left eye: No discharge.     Extraocular Movements: Extraocular movements intact.     Right eye: Normal  extraocular motion.     Left eye: Normal extraocular motion.     Conjunctiva/sclera: Conjunctivae normal.  Cardiovascular:     Rate and Rhythm: Normal rate and regular rhythm.     Heart sounds: Normal heart sounds. No murmur heard.    No friction rub. No gallop.  Pulmonary:     Effort: Pulmonary effort is normal. No respiratory distress.     Breath sounds: No stridor. No wheezing, rhonchi or rales.  Chest:     Chest wall: No tenderness.  Musculoskeletal:     Cervical back: Normal range of motion and neck supple.  Lymphadenopathy:     Cervical: No cervical adenopathy.  Skin:    General: Skin is warm and dry.  Neurological:     General: No focal deficit present.     Mental Status: She is alert and oriented to person, place, and time.  Psychiatric:        Mood and Affect: Mood normal.        Behavior: Behavior normal.     Assessment and Plan :   PDMP not reviewed this encounter.  1. Persistent cough for 3 weeks or longer   2. Allergic rhinitis, unspecified seasonality, unspecified trigger   3. Acute non-recurrent sinusitis of other sinus    X-ray over-read was pending at time of discharge, recommended follow up with only abnormal results. Otherwise will not call for negative over-read. Patient was in agreement.  Recommended Augmentin  to address an acute sinusitis.  Maintain Zyrtec  for her allergic rhinitis.  Given her diabetes and recent increase in her A1c will defer prednisone  use.  Recommend starting Qvar for what is likely a reactive airway.  Use albuterol  as needed.  Follow-up with PCP as soon as possible, consider referral to pulmonology.  Counseled patient on potential for adverse effects with medications prescribed/recommended today, ER and  return-to-clinic precautions discussed, patient verbalized understanding.     Christopher Savannah, [place] 05/30/24 [phone]

## 2024-06-16 ENCOUNTER — Other Ambulatory Visit: Payer: Self-pay | Admitting: Nurse Practitioner

## 2024-06-16 DIAGNOSIS — M4126 Other idiopathic scoliosis, lumbar region: Secondary | ICD-10-CM

## 2024-06-16 DIAGNOSIS — G8929 Other chronic pain: Secondary | ICD-10-CM

## 2024-06-16 DIAGNOSIS — K580 Irritable bowel syndrome with diarrhea: Secondary | ICD-10-CM

## 2024-06-19 ENCOUNTER — Ambulatory Visit: Admitting: Nurse Practitioner

## 2024-06-19 VITALS — BP 112/70 | HR 74 | Temp 98.5°F | Ht 64.0 in | Wt 145.2 lb

## 2024-06-19 DIAGNOSIS — R053 Chronic cough: Secondary | ICD-10-CM

## 2024-06-19 MED ORDER — BENZONATATE 200 MG PO CAPS: 200.0000 mg | ORAL_CAPSULE | Freq: Three times a day (TID) | ORAL | 0 refills | Status: DC | PRN

## 2024-06-19 NOTE — Progress Notes (Signed)
 Acute Office Visit  Subjective:    Patient ID: Sandra Booker, female    DOB: 02-13-1971, 53 y.o.   MRN: [id number]  Chief Complaint  Patient presents with   Cough    Still have dry hacking cough, with headaches     Cough   Chronic cough Onset 11/2023 after covid infection, non productive cough, waxing and waning, associated with post nasal drainage, no SOB or CP or GERD, no tobacco use currently of in the past, no second hand smoke exposure no improvement with OVER THE COUNTER med, promethazine  Dm, SABA and Qvar, zyrtec , augmentin , and oral prednisone . Normal CXR 05/2024 Start lisinopril  2.5mg  01/2024: cough has not changed with the prescription PFT completed today: normal. Stop lisinopril , resume med if cough does not improve in 2weeks Continue albuterol  and Qvar Start montelukast Entered referral to pulmonology   Outpatient Medications Prior to Visit  Medication Sig   acetaminophen  (TYLENOL ) 325 MG tablet Take 2 tablets (650 mg total) by mouth every 6 (six) hours as needed for moderate pain (pain score 4-6).   albuterol  (VENTOLIN  HFA) 108 (90 Base) MCG/ACT inhaler Inhale 1-2 puffs into the lungs every 6 (six) hours as needed for wheezing or shortness of breath.   beclomethasone (QVAR) 40 MCG/ACT inhaler Inhale 1 puff into the lungs 2 (two) times daily.   betamethasone dipropionate (DIPROLENE) 0.05 % ointment Apply topically.   cetirizine  (ZYRTEC  ALLERGY) 10 MG tablet Take 1 tablet (10 mg total) by mouth daily.   cyclobenzaprine  (FLEXERIL ) 5 MG tablet TAKE 1 TABLET(5 MG) BY MOUTH DAILY AS NEEDED FOR MUSCLE SPASMS. DO NOT TAKE AT THE SAME TIME WITH NORTRIPTYLINE    diclofenac  Sodium (VOLTAREN ) 1 % GEL Apply 2 g topically 4 (four) times daily.   diphenhydrAMINE (BENADRYL) 25 mg capsule Take 25 mg by mouth every 8 (eight) hours as needed.   glipiZIDE  (GLUCOTROL  XL) 5 MG 24 hr tablet Take 1 tablet (5 mg total) by mouth daily with breakfast.   hyoscyamine  (LEVSIN ) 0.125 MG tablet  TAKE 1 TABLET BY MOUTH EVERY 6 HOURS AS NEEDED FOR CRAMPING   levonorgestrel  (MIRENA ) 20 MCG/24HR IUD 1 each by Intrauterine route once.   lisinopril  (ZESTRIL ) 2.5 MG tablet Take 1 tablet (2.5 mg total) by mouth daily.   metFORMIN  (GLUCOPHAGE -XR) 500 MG 24 hr tablet Take 2 tablets (1,000 mg total) by mouth daily with breakfast.   Multiple Vitamin (MULTIVITAMIN) tablet Take 1 tablet by mouth daily.   nortriptyline  (PAMELOR ) 10 MG capsule Take 1 capsule (10 mg total) by mouth at bedtime as needed for sleep. For R. ankle pain   pantoprazole  (PROTONIX ) 40 MG tablet Take 1tab in Am and PM x 2weeks, then decrease to 1tab in AM before breakfast   pravastatin  (PRAVACHOL ) 20 MG tablet Take 1 tablet (20 mg total) by mouth daily.   [DISCONTINUED] promethazine -dextromethorphan (PROMETHAZINE -DM) 6.25-15 MG/5ML syrup Take 5 mLs by mouth 3 (three) times daily as needed for cough.   [DISCONTINUED] amoxicillin -clavulanate (AUGMENTIN ) 875-125 MG tablet Take 1 tablet by mouth 2 (two) times daily. (Patient not taking: Reported on 06/19/2024)   No facility-administered medications prior to visit.    Reviewed past medical and social history.  Review of Systems  Respiratory:  Positive for cough.    Per HPI     Objective:    Physical Exam Vitals and nursing note reviewed.  Cardiovascular:     Rate and Rhythm: Normal rate and regular rhythm.     Pulses: Normal pulses.     Heart  sounds: Normal heart sounds.  Pulmonary:     Effort: Pulmonary effort is normal.     Breath sounds: Normal breath sounds.  Neurological:     Mental Status: She is alert and oriented to person, place, and time.    BP 112/70 (BP Location: Left Arm, Patient Position: Sitting, Cuff Size: Normal)   Pulse 74   Temp 98.5 F (36.9 C) (Oral)   Ht 5' 4 (1.626 m)   Wt 145 lb 3.2 oz (65.9 kg)   SpO2 100%   BMI 24.92 kg/m    No results found for any visits on 06/19/24.     Assessment & Plan:   Problem List Items Addressed This  Visit     Chronic cough - Primary   Onset 11/2023 after covid infection, non productive cough, waxing and waning, associated with post nasal drainage, no SOB or CP or GERD, no tobacco use currently of in the past, no second hand smoke exposure no improvement with OVER THE COUNTER med, promethazine  Dm, SABA and Qvar, zyrtec , augmentin , and oral prednisone . Normal CXR 05/2024 Start lisinopril  2.5mg  01/2024: cough has not changed with the prescription PFT completed today: normal. Stop lisinopril , resume med if cough does not improve in 2weeks Continue albuterol  and Qvar Start montelukast Entered referral to pulmonology      Relevant Medications   benzonatate  (TESSALON ) 200 MG capsule   Other Relevant Orders   Spirometry with graph   Ambulatory referral to Pulmonology     Meds ordered this encounter  Medications   benzonatate  (TESSALON ) 200 MG capsule    Sig: Take 1 capsule (200 mg total) by mouth 3 (three) times daily as needed.    Dispense:  30 capsule    Refill:  0    Supervising Provider:   BERNETA ELSIE SAYRE [[id number]]   Return if symptoms worsen or fail to improve.    Roselie Mood, NP

## 2024-06-19 NOTE — Assessment & Plan Note (Signed)
 Onset 11/2023 after covid infection, non productive cough, waxing and waning, associated with post nasal drainage, no SOB or CP or GERD, no tobacco use currently of in the past, no second hand smoke exposure no improvement with OVER THE COUNTER med, promethazine  Dm, SABA and Qvar, zyrtec , augmentin , and oral prednisone . Normal CXR 05/2024 Start lisinopril  2.5mg  01/2024: cough has not changed with the prescription PFT completed today: normal. Stop lisinopril , resume med if cough does not improve in 2weeks Continue albuterol  and Qvar Start montelukast Entered referral to pulmonology

## 2024-06-19 NOTE — Patient Instructions (Addendum)
 Stop lisinopril , resume med if cough does not improve in 2weeks Continue albuterol  and Qvar Start montelukast You will be contacted to schedule with pulmonology

## 2024-06-20 ENCOUNTER — Encounter: Payer: Self-pay | Admitting: Nurse Practitioner

## 2024-06-20 DIAGNOSIS — R053 Chronic cough: Secondary | ICD-10-CM

## 2024-06-20 LAB — HM DIABETES EYE EXAM

## 2024-06-24 MED ORDER — MONTELUKAST SODIUM 10 MG PO TABS: 10.0000 mg | ORAL_TABLET | Freq: Every day | ORAL | 1 refills | Status: AC

## 2024-06-24 NOTE — Addendum Note (Signed)
 Addended by: KATHEEN GARDEN L on: 06/24/2024 12:03 PM   Modules accepted: Orders

## 2024-06-28 ENCOUNTER — Ambulatory Visit: Admitting: Nurse Practitioner

## 2024-07-14 ENCOUNTER — Other Ambulatory Visit: Payer: Self-pay | Admitting: Nurse Practitioner

## 2024-07-14 DIAGNOSIS — E1169 Type 2 diabetes mellitus with other specified complication: Secondary | ICD-10-CM

## 2024-07-26 ENCOUNTER — Ambulatory Visit
Admission: RE | Admit: 2024-07-26 | Discharge: 2024-07-26 | Disposition: A | Discharge status: Home / Self Care | Source: Ambulatory Visit | Attending: Family Medicine | Admitting: Family Medicine

## 2024-07-26 ENCOUNTER — Other Ambulatory Visit: Payer: Self-pay

## 2024-07-26 VITALS — BP 101/68 | HR 71 | Temp 98.1°F | Resp 16

## 2024-07-26 DIAGNOSIS — N3001 Acute cystitis with hematuria: Secondary | ICD-10-CM | POA: Insufficient documentation

## 2024-07-26 DIAGNOSIS — R300 Dysuria: Secondary | ICD-10-CM | POA: Insufficient documentation

## 2024-07-26 LAB — POCT URINE DIPSTICK
Bilirubin, UA: NEGATIVE
Glucose, UA: NEGATIVE mg/dL
Ketones, POC UA: NEGATIVE mg/dL
Nitrite, UA: NEGATIVE
POC PROTEIN,UA: NEGATIVE
Spec Grav, UA: 1.02 (ref 1.010–1.025)
Urobilinogen, UA: 1 U/dL
pH, UA: 6 (ref 5.0–8.0)

## 2024-07-26 MED ORDER — CEPHALEXIN 500 MG PO CAPS: 500.0000 mg | ORAL_CAPSULE | Freq: Two times a day (BID) | ORAL | 0 refills | Status: AC

## 2024-07-26 NOTE — ED Provider Notes (Addendum)
 [facility]    CSN: [id number] Arrival date & time: 07/26/24  0845      History   Chief Complaint Chief Complaint  Patient presents with   Urinary Frequency   Pelvic Pain    HPI Sandra Booker is a 53 y.o. female presents for urinary symptoms.  Patient reports 1 week of urinary frequency, urgency with suprapubic pressure denies burning with urination, hematuria, fevers, nausea/vomiting, flank pain.  No vaginal discharge or STD concern.  No OTC treatments have been used for symptoms.  No other concerns at this time.   Urinary Frequency  Pelvic Pain    Past Medical History:  Diagnosis Date   Abnormal Pap smear    Allergy    Anemia    Anxiety    Arthritis    Cyst of breast    right breast   Diabetes mellitus without complication (HCC) 06/2019   Type 2   Encounter for insertion of Mirena  IUD 09/27/2010   Fibroid    GERD (gastroesophageal reflux disease)    H/O cervicitis    H/O menorrhagia    History of allergic rhinitis    History of iron deficiency    History of iron deficiency    History of vitamin D  deficiency    Hyperglycemia    on metformin  but pt states no MD states DM   Hyperlipidemia    Hypoglycemia    IBS (irritable bowel syndrome)    Neuromuscular disorder (HCC)    nerve damage in right ankle    Pelvic pain in female    h/o   Seizures (HCC)    x 1 30 yrs ago with pregnancy - toxemia - none since    UTI (urinary tract infection)    h/o    Patient Active Problem List   Diagnosis Date Noted   Chronic cough 06/19/2024   Scoliosis of thoracic spine 10/18/2023   Chronic bilateral back pain 10/18/2023   Disrupted sleep-wake cycle 03/27/2023   IUD (intrauterine device) in place 08/23/2021   History of ankle surgery 08/23/2021   Hyperlipidemia associated with type 2 diabetes mellitus (HCC) 08/23/2021   Type 2 diabetes mellitus (HCC) 10/29/2020   Vitamin D  deficiency 10/29/2020   Gastroesophageal reflux disease 05/26/2020   Chronic  postoperative pain 03/01/2018   Fibroid    Pelvic pain in female    ASCUS favor benign    Irritable bowel syndrome    History of allergic rhinitis     Past Surgical History:  Procedure Laterality Date   ANKLE SURGERY  06/22/2011   DILATION AND CURETTAGE OF UTERUS  09/29/2005   HERNIA REPAIR     HYSTEROSCOPY  09/29/2005   MYOMECTOMY      OB History     Gravida  2   Para  2   Term      Preterm      AB      Living  2      SAB      IAB      Ectopic      Multiple      Live Births               Home Medications    Prior to Admission medications   Medication Sig Start Date End Date Taking? Authorizing Provider  cephALEXin  (KEFLEX ) 500 MG capsule Take 1 capsule (500 mg total) by mouth 2 (two) times daily for 7 days. 07/26/24 08/02/24 Yes Jeanann Balinski, Jodi R, NP  acetaminophen  (TYLENOL ) 325  MG tablet Take 2 tablets (650 mg total) by mouth every 6 (six) hours as needed for moderate pain (pain score 4-6). 01/19/24   Christopher Savannah, PA-C  albuterol  (VENTOLIN  HFA) 108 (90 Base) MCG/ACT inhaler Inhale 1-2 puffs into the lungs every 6 (six) hours as needed for wheezing or shortness of breath. 05/30/24   Christopher Savannah, PA-C  beclomethasone (QVAR) 40 MCG/ACT inhaler Inhale 1 puff into the lungs 2 (two) times daily. 05/30/24   Christopher Savannah, PA-C  benzonatate  (TESSALON ) 200 MG capsule Take 1 capsule (200 mg total) by mouth 3 (three) times daily as needed. 06/19/24   Nche, Roselie Rockford, NP  betamethasone dipropionate (DIPROLENE) 0.05 % ointment Apply topically.    [provider]  cetirizine  (ZYRTEC  ALLERGY) 10 MG tablet Take 1 tablet (10 mg total) by mouth daily. 01/19/24   Christopher Savannah, PA-C  cyclobenzaprine  (FLEXERIL ) 5 MG tablet TAKE 1 TABLET(5 MG) BY MOUTH DAILY AS NEEDED FOR MUSCLE SPASMS. DO NOT TAKE AT THE SAME TIME WITH NORTRIPTYLINE  06/17/24   Nche, Roselie Rockford, NP  diclofenac  Sodium (VOLTAREN ) 1 % GEL Apply 2 g topically 4 (four) times daily. 08/01/21   Stuart Vernell Norris, PA-C  diphenhydrAMINE (BENADRYL) 25 mg capsule Take 25 mg by mouth every 8 (eight) hours as needed.    [provider]  glipiZIDE  (GLUCOTROL  XL) 5 MG 24 hr tablet Take 1 tablet (5 mg total) by mouth daily with breakfast. 03/27/24   Nche, Roselie Rockford, NP  hyoscyamine  (LEVSIN ) 0.125 MG tablet TAKE 1 TABLET BY MOUTH EVERY 6 HOURS AS NEEDED FOR CRAMPING 06/17/24   Nche, Roselie Rockford, NP  levonorgestrel  (MIRENA ) 20 MCG/24HR IUD 1 each by Intrauterine route once.    [provider]  lisinopril  (ZESTRIL ) 2.5 MG tablet Take 1 tablet (2.5 mg total) by mouth daily. 03/26/24   Nche, Roselie Rockford, NP  metFORMIN  (GLUCOPHAGE -XR) 500 MG 24 hr tablet Take 2 tablets (1,000 mg total) by mouth daily with breakfast. 09/27/23   Nche, Roselie Rockford, NP  montelukast  (SINGULAIR ) 10 MG tablet Take 1 tablet (10 mg total) by mouth at bedtime. 06/24/24   Nche, Roselie Rockford, NP  Multiple Vitamin (MULTIVITAMIN) tablet Take 1 tablet by mouth daily.    [provider]  nortriptyline  (PAMELOR ) 10 MG capsule Take 1 capsule (10 mg total) by mouth at bedtime as needed for sleep. For R. ankle pain 03/27/23   Nche, Roselie Rockford, NP  pantoprazole  (PROTONIX ) 40 MG tablet Take 1tab in Am and PM x 2weeks, then decrease to 1tab in AM before breakfast 03/26/24   Nche, Roselie Rockford, NP  pravastatin  (PRAVACHOL ) 20 MG tablet TAKE 1 TABLET BY MOUTH DAILY 07/15/24   Nche, Roselie Rockford, NP    Family History Family History  Problem Relation Age of Onset   Breast cancer Mother 21   Hypertension Mother    Cancer Mother 21       breast   Arthritis Mother    Hypertension Father    Hyperlipidemia Father    Cancer Father        melanoma   Cancer Maternal Grandfather 81       Prostate   Colon cancer Neg Hx    Colon polyps Neg Hx    Esophageal cancer Neg Hx    Rectal cancer Neg Hx    Stomach cancer Neg Hx     Social History Social History   Tobacco Use   Smoking status: Never   Smokeless tobacco: Never   Vaping Use  Vaping status: Never Used  Substance Use Topics   Alcohol use: No   Drug use: No     Allergies   Dust mite extract and Timothy grass pollen allergen   Review of Systems Review of Systems  Genitourinary:  Positive for frequency.     Physical Exam Triage Vital Signs ED Triage Vitals  Encounter Vitals Group     BP 07/26/24 0854 101/68     Girls Systolic BP Percentile --      Girls Diastolic BP Percentile --      Boys Systolic BP Percentile --      Boys Diastolic BP Percentile --      Pulse Rate 07/26/24 0854 71     Resp 07/26/24 0854 16     Temp 07/26/24 0854 98.1 F (36.7 C)     Temp Source 07/26/24 0854 Oral     SpO2 07/26/24 0854 98 %     Weight --      Height --      Head Circumference --      Peak Flow --      Pain Score 07/26/24 0852 4     Pain Loc --      Pain Education --      Exclude from Growth Chart --    No data found.  Updated Vital Signs BP 101/68   Pulse 71   Temp 98.1 F (36.7 C) (Oral)   Resp 16   SpO2 98%   Visual Acuity Right Eye Distance:   Left Eye Distance:   Bilateral Distance:    Right Eye Near:   Left Eye Near:    Bilateral Near:     Physical Exam Vitals and nursing note reviewed.  Constitutional:      Appearance: Normal appearance.  HENT:     Head: Normocephalic and atraumatic.  Eyes:     Pupils: Pupils are equal, round, and reactive to light.  Cardiovascular:     Rate and Rhythm: Normal rate.  Pulmonary:     Effort: Pulmonary effort is normal.  Abdominal:     Tenderness: There is no right CVA tenderness or left CVA tenderness.  Skin:    General: Skin is warm and dry.  Neurological:     General: No focal deficit present.     Mental Status: She is alert and oriented to person, place, and time.  Psychiatric:        Mood and Affect: Mood normal.        Behavior: Behavior normal.      UC Treatments / Results  Labs (all labs ordered are listed, but only abnormal results are displayed) Labs  Reviewed  POCT URINE DIPSTICK - Abnormal; Notable for the following components:      Result Value   Clarity, UA hazy (*)    Blood, UA trace-lysed (*)    Leukocytes, UA Small (1+) (*)    All other components within normal limits  URINE CULTURE   Renal Function Panel Order: [id number]  Status: Final result     Next appt: 08/07/2024 at 08:30 AM in Pulmonology Kristen Coder, MD)     Dx: Type 2 diabetes mellitus with other s...   Test Result Released: Yes (seen)     Messages: Seen   1 Result Note     1 Patient Communication     View Follow-Up Encounter     1 HM Topic          Component Ref Range & Units (hover)  4 mo ago (03/26/24) 10 mo ago (09/27/23) 10 mo ago (09/27/23) 1 yr ago (03/27/23) 1 yr ago (09/26/22) 2 yr ago (02/28/22) 2 yr ago (08/23/21)  Sodium 136  138 138 138 139 137  Potassium 4.1  4.3 4.5 4.2 5.0 4.0  Chloride 98  99 99 101 100 102  CO2 28  30 28 28 29 26   Albumin 4.7 4.9 4.9  4.7  4.4  BUN 13  10 9 9 11 8   Creatinine, Ser 0.68  0.69 0.64 0.68 0.74 0.63  Glucose, Bld 107 High   101 High  96 107 High  103 High  133 High   Phosphorus 3.5  3.4      GFR 98.15  98.14 CM 100.29 CM 99.18 CM 92.51 CM 101.80 CM  Comment: Calculated using the CKD-EPI Creatinine Equation (2021)  Calcium 10.0  10.2 9.7 10.1 10.4 9.5  Resulting Agency [place] [facility] [place] [facility] [place] [facility] [place] [facility] [place] [facility] [place] [facility] [place] [facility]        Specimen Collected: 03/26/24 08:50 Last Resulted: 03/26/24 11:20   EKG   Radiology No results found.  Procedures Procedures (including critical care time)  Medications Ordered in UC Medications - No data to display  Initial Impression / Assessment and Plan / UC Course  I have reviewed the triage vital signs and the nursing notes.  Pertinent labs & imaging results that were available during my care of the patient were reviewed by me and considered in my medical decision making (see chart for details).      Reviewed exam and symptoms with patient.  No red flags.  Urine positive for UTI, will culture and start Keflex .  Encouraged rest fluids and PCP follow-up if symptoms do not improve.  ER precautions reviewed. Final Clinical Impressions(s) / UC Diagnoses   Final diagnoses:  Dysuria  Acute cystitis with hematuria     Discharge Instructions      The clinic will contact you with the results of the urine culture done today are positive.  Start Keflex  twice daily for 7 days.  Lots of rest and fluids.  Please follow-up with your PCP if your symptoms do not improve.  Please go to the ER if you develop any worsening symptoms.  Hope you feel better soon!    ED Prescriptions     Medication Sig Dispense Auth. Provider   cephALEXin  (KEFLEX ) 500 MG capsule Take 1 capsule (500 mg total) by mouth 2 (two) times daily for 7 days. 14 capsule Malayia Spizzirri, Jodi R, NP      PDMP not reviewed this encounter.   Loreda Myla SAUNDERS, NP 07/26/24 0911    Loreda Myla SAUNDERS, NP 07/26/24 [phone]

## 2024-07-26 NOTE — Discharge Instructions (Addendum)
 The clinic will contact you with the results of the urine culture done today are positive.  Start Keflex  twice daily for 7 days.  Lots of rest and fluids.  Please follow-up with your PCP if your symptoms do not improve.  Please go to the ER if you develop any worsening symptoms.  Hope you feel better soon!

## 2024-07-26 NOTE — ED Triage Notes (Signed)
 Pt c/o bladder feels full and heavy, frequent urination, pelvic painx1wk

## 2024-07-28 LAB — URINE CULTURE: Culture: 20000 — AB

## 2024-07-29 ENCOUNTER — Ambulatory Visit: Payer: Self-pay

## 2024-08-07 ENCOUNTER — Encounter: Payer: Self-pay | Admitting: Pulmonary Disease

## 2024-08-07 ENCOUNTER — Ambulatory Visit: Admitting: Pulmonary Disease

## 2024-08-07 VITALS — BP 102/84 | HR 71 | Temp 98.4°F | Ht 64.0 in | Wt 147.0 lb

## 2024-08-07 DIAGNOSIS — J45909 Unspecified asthma, uncomplicated: Secondary | ICD-10-CM

## 2024-08-07 DIAGNOSIS — J329 Chronic sinusitis, unspecified: Secondary | ICD-10-CM | POA: Diagnosis not present

## 2024-08-07 DIAGNOSIS — K219 Gastro-esophageal reflux disease without esophagitis: Secondary | ICD-10-CM | POA: Diagnosis not present

## 2024-08-07 DIAGNOSIS — R059 Cough, unspecified: Secondary | ICD-10-CM

## 2024-08-07 DIAGNOSIS — J4540 Moderate persistent asthma, uncomplicated: Secondary | ICD-10-CM | POA: Diagnosis not present

## 2024-08-07 DIAGNOSIS — U099 Post covid-19 condition, unspecified: Secondary | ICD-10-CM | POA: Diagnosis not present

## 2024-08-07 LAB — CBC WITH DIFFERENTIAL/PLATELET
Basophils Absolute: 0.1 K/uL (ref 0.0–0.1)
Basophils Relative: 1 % (ref 0.0–3.0)
Eosinophils Absolute: 0.2 K/uL (ref 0.0–0.7)
Eosinophils Relative: 3.9 % (ref 0.0–5.0)
HCT: 39.7 % (ref 36.0–46.0)
Hemoglobin: 12.9 g/dL (ref 12.0–15.0)
Lymphocytes Relative: 40 % (ref 12.0–46.0)
Lymphs Abs: 2.3 K/uL (ref 0.7–4.0)
MCHC: 32.4 g/dL (ref 30.0–36.0)
MCV: 89.5 fl (ref 78.0–100.0)
Monocytes Absolute: 0.5 K/uL (ref 0.1–1.0)
Monocytes Relative: 8.7 % (ref 3.0–12.0)
Neutro Abs: 2.7 K/uL (ref 1.4–7.7)
Neutrophils Relative %: 46.4 % (ref 43.0–77.0)
Platelets: 255 K/uL (ref 150.0–400.0)
RBC: 4.44 Mil/uL (ref 3.87–5.11)
RDW: 12.8 % (ref 11.5–15.5)
WBC: 5.7 K/uL (ref 4.0–10.5)

## 2024-08-07 LAB — POCT EXHALED NITRIC OXIDE: FeNO level (ppb): 19

## 2024-08-07 MED ORDER — FLUTICASONE PROPIONATE 50 MCG/ACT NA SUSP: 2.0000 | Freq: Every day | NASAL | 2 refills | Status: AC

## 2024-08-07 MED ORDER — BUDESONIDE-FORMOTEROL FUMARATE 160-4.5 MCG/ACT IN AERO: 2.0000 | INHALATION_SPRAY | Freq: Two times a day (BID) | RESPIRATORY_TRACT | 11 refills | Status: AC

## 2024-08-07 NOTE — Patient Instructions (Addendum)
  VISIT SUMMARY: Today, we discussed your persistent cough and respiratory symptoms that have been ongoing since January following a COVID-19 infection. We reviewed your current medications and made some adjustments to better manage your symptoms. We also addressed your asthma, allergies, chronic sinusitis, and gastroesophageal reflux disease (GERD).  YOUR PLAN: -CHRONIC COUGH: Your chronic cough is likely due to a combination of asthma, allergies, chronic sinusitis, and GERD. We will start you on Flonase  nasal spray to help reduce post-nasal drip, continue Protonix  once daily for GERD, and recommend using room temperature water and lozenges to soothe your throat and suppress the cough.  -ASTHMA: Asthma is a condition where your airways become inflamed and narrow, causing difficulty in breathing. Your asthma symptoms have worsened since your COVID-19 infection. We will switch you to a stronger inhaler, Symbicort , which you should use two puffs twice a day. We will also order blood tests to check for allergic inflammation and plan a lung function test after 1-2 months on Symbicort .  -ALLERGIC RHINITIS (SEASONAL ALLERGIES): Allergic rhinitis is an allergic reaction that causes sneezing, congestion, and a runny nose. It contributes to your post-nasal drip and cough. Continue taking Singulair  and Zyrtec , and add Flonase  nasal spray for better control of your symptoms.  -CHRONIC SINUSITIS: Chronic sinusitis is a condition where the cavities around your nasal passages become inflamed for at least 12 weeks. This contributes to your post-nasal drip and cough. Continue taking Singulair  and Zyrtec , and add Flonase  nasal spray to manage your symptoms.  -GASTROESOPHAGEAL REFLUX DISEASE (GERD): GERD is a condition where stomach acid frequently flows back into the tube connecting your mouth and stomach, causing irritation. This can contribute to a chronic cough, especially at night. Continue taking Protonix  once daily  and try to avoid cold water to reduce throat irritation.  INSTRUCTIONS: Please follow up with blood tests to assess for allergic inflammation and plan for a lung function test after 1-2 months on Symbicort . Continue with the prescribed medications and lifestyle modifications as discussed.

## 2024-08-07 NOTE — Progress Notes (Signed)
 Sandra Booker    [id number]    01-02-1971  Primary Care Physician:Nche, Roselie Rockford, NP  Referring Physician: Katheen Roselie Rockford, NP [address] [place],  [place] [address]  Chief complaint:    HPI: 53 y.o. who  has a past medical history of Abnormal Pap smear, Allergy, Anemia, Anxiety, Arthritis, Cyst of breast, Diabetes mellitus without complication (HCC) (06/2019), Encounter for insertion of Mirena  IUD (09/27/2010), Fibroid, GERD (gastroesophageal reflux disease), H/O cervicitis, H/O menorrhagia, History of allergic rhinitis, History of iron deficiency, History of iron deficiency, History of vitamin D  deficiency, Hyperglycemia, Hyperlipidemia, Hypoglycemia, IBS (irritable bowel syndrome), Neuromuscular disorder (HCC), Pelvic pain in female, Seizures (HCC), and UTI (urinary tract infection).  Discussed the use of AI scribe software for clinical note transcription with the patient, who gave verbal consent to proceed.  History of Present Illness Sandra Booker is a 53 year old female with post-COVID respiratory issues who presents with a persistent cough.  Persistent cough and respiratory symptoms - Persistent cough since January following COVID-19 infection - Cough fluctuates in intensity, sometimes slowing down and then worsening - Cough affects sleep and daily activities - Multiple upper respiratory illnesses since January, including two sinus infections and bronchitis - Antibiotics and steroids have provided some relief, but cough persists - No history of lung issues or childhood asthma prior to this year - No chest congestion - Occasional chest tightness, somewhat relieved by inhalers - No family history of lung disease  Inhaler and medication use - Uses albuterol  and Qvar inhalers - Increased inhaler usage over the past week due to worsening symptoms - Prednisone  prescribed in the past, providing temporary relief but persistent cough - Recently  prescribed Singulair  for allergies - Takes Benadryl for seasonal allergies - Takes Zyrtec  as needed for post-nasal drip and sinus issues  Allergic and upper airway symptoms - Seasonal allergies present - Post-nasal drip and sinus issues - Two sinus infections since January - Bronchitis since January  Gastroesophageal reflux symptoms - Acid reflux managed with Protonix  for over ten years - Reflux not fully controlled despite increasing Protonix  to twice daily  Relevant Pulmonary history: Pets: No pets Occupation: Works in Adult nurse Exposures: No mold, hot tub, Financial controller.  No feather pillow comforter No h/o chemo/XRT/amiodarone/macrodantin /MTX  No exposure to asbestos, silica or other organic allergens  Smoking history: Never smoker Travel history: No significant travel history Family history: No family Struve lung disease   Outpatient Encounter Medications as of 08/07/2024  Medication Sig   acetaminophen  (TYLENOL ) 325 MG tablet Take 2 tablets (650 mg total) by mouth every 6 (six) hours as needed for moderate pain (pain score 4-6).   albuterol  (VENTOLIN  HFA) 108 (90 Base) MCG/ACT inhaler Inhale 1-2 puffs into the lungs every 6 (six) hours as needed for wheezing or shortness of breath.   beclomethasone (QVAR) 40 MCG/ACT inhaler Inhale 1 puff into the lungs 2 (two) times daily.   betamethasone dipropionate (DIPROLENE) 0.05 % ointment Apply topically.   cetirizine  (ZYRTEC  ALLERGY) 10 MG tablet Take 1 tablet (10 mg total) by mouth daily.   cyclobenzaprine  (FLEXERIL ) 5 MG tablet TAKE 1 TABLET(5 MG) BY MOUTH DAILY AS NEEDED FOR MUSCLE SPASMS. DO NOT TAKE AT THE SAME TIME WITH NORTRIPTYLINE    diphenhydrAMINE (BENADRYL) 25 mg capsule Take 25 mg by mouth every 8 (eight) hours as needed.   glipiZIDE  (GLUCOTROL  XL) 5 MG 24 hr tablet Take 1 tablet (5 mg total) by mouth daily with  breakfast.   hyoscyamine  (LEVSIN ) 0.125 MG tablet TAKE 1 TABLET BY MOUTH EVERY 6 HOURS AS NEEDED FOR CRAMPING    levonorgestrel  (MIRENA ) 20 MCG/24HR IUD 1 each by Intrauterine route once.   lisinopril  (ZESTRIL ) 2.5 MG tablet Take 1 tablet (2.5 mg total) by mouth daily.   metFORMIN  (GLUCOPHAGE -XR) 500 MG 24 hr tablet Take 2 tablets (1,000 mg total) by mouth daily with breakfast.   montelukast  (SINGULAIR ) 10 MG tablet Take 1 tablet (10 mg total) by mouth at bedtime.   Multiple Vitamin (MULTIVITAMIN) tablet Take 1 tablet by mouth daily.   nortriptyline  (PAMELOR ) 10 MG capsule Take 1 capsule (10 mg total) by mouth at bedtime as needed for sleep. For R. ankle pain   pantoprazole  (PROTONIX ) 40 MG tablet Take 1tab in Am and PM x 2weeks, then decrease to 1tab in AM before breakfast   pravastatin  (PRAVACHOL ) 20 MG tablet TAKE 1 TABLET BY MOUTH DAILY   benzonatate  (TESSALON ) 200 MG capsule Take 1 capsule (200 mg total) by mouth 3 (three) times daily as needed. (Patient not taking: Reported on 08/07/2024)   diclofenac  Sodium (VOLTAREN ) 1 % GEL Apply 2 g topically 4 (four) times daily. (Patient not taking: Reported on 08/07/2024)   No facility-administered encounter medications on file as of 08/07/2024.    Physical Exam: Today's Vitals   08/07/24 0828  BP: 102/84  Pulse: 71  Temp: 98.4 F (36.9 C)  TempSrc: Oral  SpO2: 100%  Weight: 147 lb (66.7 kg)  Height: 5' 4 (1.626 m)   Body mass index is 25.23 kg/m.  Physical Exam GEN: No acute distress. CV: Regular rate and rhythm, no murmurs. LUNGS: Clear to auscultation bilaterally, normal respiratory effort. SKIN JOINTS: Warm and dry, no rash.   Data Reviewed: Imaging: Chest x-ray 05/30/2024-normal lung volumes, no active cardiopulmonary abnormality I reviewed the images personally  PFTs:  FENO 08/07/2024-19  Labs: CBC 08/23/2021- WBC 5.4, eos 0.8%, absolute eosinophil count 43  Assessment and Plan Assessment & Plan Chronic cough due to asthma, allergic rhinitis, chronic sinusitis, and gastroesophageal reflux disease Chronic cough persisting since  COVID infection in January, exacerbated by asthma, allergic rhinitis, chronic sinusitis, and GERD. Cough is persistent, nocturnal, and worsened by post nasal drip and acid reflux. Protonix  has been used for GERD with limited success. Post nasal drip is managed with Singulair  and Zyrtec , with additional treatment with Flonase  planned. - Prescribe Flonase  nasal spray to reduce post nasal drip. - Continue Protonix  once daily for GERD. - Encourage use of room temperature water and lozenges to soothe throat and suppress cough.  Asthma, post-COVID exacerbation Asthma exacerbated post-COVID with symptoms of cough and chest tightness. Current treatment includes albuterol  and Qvar inhalers. Recent increase in inhaler use due to worsening symptoms.  Normal FENO indicates lower airway inflammation chest X-ray earlier this year was normal. Plan to switch to a stronger controller medication, Symbicort , which includes a steroid and a long-acting bronchodilator. - Prescribe Symbicort  inhaler, two puffs twice a day. - Order blood tests to assess for allergic inflammation. - Plan for lung function test after 1-2 months on Symbicort .  Allergic rhinitis (seasonal allergies) Seasonal allergies contributing to post nasal drip and cough. Currently managed with Singulair  and Zyrtec . No nasal sprays used previously. - Continue Singulair  and Zyrtec  for allergic rhinitis. - Add Flonase  nasal spray for additional control of symptoms.  Chronic sinusitis Chronic sinusitis contributing to post nasal drip and cough. Managed with Singulair  and Zyrtec . Recent sinus infection treated with antibiotics and steroids. -  Continue Singulair  and Zyrtec . - Add Flonase  nasal spray to manage post nasal drip.  Gastroesophageal reflux disease (GERD) GERD contributing to chronic cough, particularly at night. Managed with Protonix , which has been used for over ten years. Increasing Protonix  to twice daily did not improve symptoms, so  reverted to once daily. - Continue Protonix  once daily. - Encourage lifestyle modifications such as avoiding cold water to reduce throat irritation.   Recommendations: Change Qvar to Symbicort  Check CBC differential, IgE PFTs Continue Singulair , Zyrtec .  Add Flonase  Continue PPI  Lonna Coder MD [facility] 08/07/2024, 8:40 AM  CC: Nche, Roselie Rockford, NP

## 2024-08-08 LAB — IGE: IgE (Immunoglobulin E), Serum: 57 kU/L (ref <=114)

## 2024-08-09 ENCOUNTER — Ambulatory Visit: Payer: Self-pay | Admitting: Pulmonary Disease

## 2024-09-26 ENCOUNTER — Ambulatory Visit: Admitting: Nurse Practitioner

## 2024-09-26 VITALS — BP 110/78 | HR 80 | Temp 97.6°F | Ht 64.0 in | Wt 148.0 lb

## 2024-09-26 DIAGNOSIS — E1169 Type 2 diabetes mellitus with other specified complication: Secondary | ICD-10-CM

## 2024-09-26 DIAGNOSIS — Z7984 Long term (current) use of oral hypoglycemic drugs: Secondary | ICD-10-CM | POA: Diagnosis not present

## 2024-09-26 DIAGNOSIS — E785 Hyperlipidemia, unspecified: Secondary | ICD-10-CM | POA: Diagnosis not present

## 2024-09-26 DIAGNOSIS — Z23 Encounter for immunization: Secondary | ICD-10-CM | POA: Diagnosis not present

## 2024-09-26 LAB — POCT GLYCOSYLATED HEMOGLOBIN (HGB A1C): Hemoglobin A1C: 6.2 % — AB (ref 4.0–5.6)

## 2024-09-26 LAB — COMPREHENSIVE METABOLIC PANEL WITH GFR
ALT: 12 U/L (ref 0–35)
AST: 19 U/L (ref 0–37)
Albumin: 4.7 g/dL (ref 3.5–5.2)
Alkaline Phosphatase: 70 U/L (ref 39–117)
BUN: 14 mg/dL (ref 6–23)
CO2: 28 meq/L (ref 19–32)
Calcium: 10.3 mg/dL (ref 8.4–10.5)
Chloride: 101 meq/L (ref 96–112)
Creatinine, Ser: 0.61 mg/dL (ref 0.40–1.20)
GFR: 100.39 mL/min (ref >60.00)
Glucose, Bld: 90 mg/dL (ref 70–99)
Potassium: 4.3 meq/L (ref 3.5–5.1)
Sodium: 137 meq/L (ref 135–145)
Total Bilirubin: 0.3 mg/dL (ref 0.2–1.2)
Total Protein: 7.6 g/dL (ref 6.0–8.3)

## 2024-09-26 LAB — LIPID PANEL
Cholesterol: 156 mg/dL (ref 0–200)
HDL: 52.1 mg/dL (ref >39.00)
LDL Cholesterol: 92 mg/dL (ref 0–99)
NonHDL: 103.53
Total CHOL/HDL Ratio: 3
Triglycerides: 58 mg/dL (ref 0.0–149.0)
VLDL: 11.6 mg/dL (ref 0.0–40.0)

## 2024-09-26 LAB — MICROALBUMIN / CREATININE URINE RATIO
Creatinine,U: 62.3 mg/dL
Microalb Creat Ratio: UNDETERMINED mg/g (ref 0.0–30.0)
Microalb, Ur: <0.7 mg/dL

## 2024-09-26 MED ORDER — GLIPIZIDE ER 2.5 MG PO TB24: 2.5000 mg | ORAL_TABLET | Freq: Every day | ORAL | 1 refills | Status: AC

## 2024-09-26 NOTE — Patient Instructions (Addendum)
 Decrease glipizide  dose to 2.5mg  with supper Maintain other med doses Go to lab  Mediterranean Diet A Mediterranean diet is based on the traditions of countries on the [employer]. It focuses on eating more: Fruits and vegetables. Whole grains, beans, nuts, and seeds. Heart-healthy fats. These are fats that are good for your heart. It involves eating less: Dairy. Meat and eggs. Processed foods with added sugar, salt, and fat. This type of diet can help prevent certain conditions. It can also improve outcomes if you have a long-term (chronic) disease, such as kidney or heart disease. What are tips for following this plan? Reading food labels Check packaged foods for: The serving size. For foods such as rice and pasta, the serving size is the amount of cooked product, not dry. The total fat. Avoid foods with saturated fat or trans fat. Added sugars, such as corn syrup. Shopping  Try to have a balanced diet. Buy a variety of foods, such as: Fresh fruits and vegetables. You may be able to get these from local farmers markets. You can also buy them frozen. Grains, beans, nuts, and seeds. Some of these can be bought in bulk. Fresh seafood. Poultry and eggs. Low-fat dairy products. Buy whole ingredients instead of foods that have already been packaged. If you can't get fresh seafood, buy precooked frozen shrimp or canned fish, such as tuna, salmon, or sardines. Stock your pantry so you always have certain foods on hand, such as olive oil, canned tuna, canned tomatoes, rice, pasta, and beans. Cooking Cook foods with extra-virgin olive oil instead of using butter or other vegetable oils. Have meat as a side dish. Have vegetables or grains as your main dish. This means having meat in small portions or adding small amounts of meat to foods like pasta or stew. Use beans or vegetables instead of meat in common dishes like chili or lasagna. Try out different cooking methods. Try roasting,  broiling, steaming, and sauting vegetables. Add frozen vegetables to soups, stews, pasta, or rice. Add nuts or seeds for added healthy fats and plant protein at each meal. You can add these to yogurt, salads, or vegetable dishes. Marinate fish or vegetables using olive oil, lemon juice, garlic, and fresh herbs. Meal planning Plan to eat a vegetarian meal one day each week. Try to work up to two vegetarian meals, if possible. Eat seafood two or more times a week. Have healthy snacks on hand. These may include: Vegetable sticks with hummus. Greek yogurt. Fruit and nut trail mix. Eat balanced meals. These should include: Fruit: 2-3 servings a day. Vegetables: 4-5 servings a day. Low-fat dairy: 2 servings a day. Fish, poultry, or lean meat: 1 serving a day. Beans and legumes: 2 or more servings a week. Nuts and seeds: 1-2 servings a day. Whole grains: 6-8 servings a day. Extra-virgin olive oil: 3-4 servings a day. Limit red meat and sweets to just a few servings a month. Lifestyle  Try to cook and eat meals with your family. Drink enough fluid to keep your pee (urine) pale yellow. Be active every day. This includes: Aerobic exercise, which is exercise that causes your heart to beat faster. Examples include running and swimming. Leisure activities like gardening, walking, or housework. Get 7-8 hours of sleep each night. Drink red wine if your provider says you can. A glass of wine is 5 oz (150 mL). You may be allowed to have: Up to 1 glass a day if you're female and not pregnant. Up to 2  glasses a day if you're female. What foods should I eat? Fruits Apples. Apricots. Avocado. Berries. Bananas. Cherries. Dates. Figs. Grapes. Lemons. Melon. Oranges. Peaches. Plums. Pomegranate. Vegetables Artichokes. Beets. Broccoli. Cabbage. Carrots. Eggplant. Green beans. Chard. Kale. Spinach. Onions. Leeks. Peas. Squash. Tomatoes. Peppers. Radishes. Grains Whole-grain pasta. Brown rice. Bulgur  wheat. Polenta. Couscous. Whole-wheat bread. [place] Madeira. Meats and other proteins Beans. Almonds. Sunflower seeds. Pine nuts. Peanuts. Cod. Salmon. Scallops. Shrimp. Tuna. Tilapia. Clams. Oysters. Eggs. Chicken or turkey without skin. Dairy Low-fat milk. Cheese. Greek yogurt. Fats and oils Extra-virgin olive oil. Avocado oil. Grapeseed oil. Beverages Water. Red wine. Herbal tea. Sweets and desserts Greek yogurt with honey. Baked apples. Poached pears. Trail mix. Seasonings and condiments Basil. Cilantro. Coriander. Cumin. Mint. Parsley. Sage. Rosemary. Tarragon. Garlic. Oregano. Thyme. Pepper. Balsamic vinegar. Tahini. Hummus. Tomato sauce. Olives. Mushrooms. The items listed above may not be all the foods and drinks you can have. Talk to a dietitian to learn more. What foods should I limit? This is a list of foods that should be eaten rarely. Fruits Fruit canned in syrup. Vegetables Deep-fried potatoes, like French fries. Grains Packaged pasta or rice dishes. Cereal with added sugar. Snacks with added sugar. Meats and other proteins Beef. Pork. Lamb. Chicken or turkey with skin. Hot dogs. [place]. Dairy Ice cream. Sour cream. Whole milk. Fats and oils Butter. Canola oil. Vegetable oil. Beef fat (tallow). Lard. Beverages Juice. Sugar-sweetened soft drinks. Beer. Liquor and spirits. Sweets and desserts Cookies. Cakes. Pies. Candy. Seasonings and condiments Mayonnaise. Pre-made sauces and marinades. The items listed above may not be all the foods and drinks you should limit. Talk to a dietitian to learn more. Where to find more information American Heart Association (AHA): heart.org This information is not intended to replace advice given to you by your health care provider. Make sure you discuss any questions you have with your health care provider. Document Revised: 02/19/2023 Document Reviewed: 02/19/2023 Elsevier Patient Education  2024 [employer].

## 2024-09-26 NOTE — Assessment & Plan Note (Signed)
 Reports mid-day hypoglycemic symptoms with added glipizide  5mg  in AM. Normal foot exam today. Repeat hgbA1c: 6.2% Maintain metformin  dose Decrease glipizide  dose to 2.5mg  with heaviest meal-supper. Check CMP, UACr, and lipid panel F/up in 3months

## 2024-09-26 NOTE — Progress Notes (Signed)
 Established Patient Visit  Patient: Sandra Booker   DOB: 13-Jul-1971   53 y.o. Female  MRN: [id number] Visit Date: 09/26/2024  Subjective:    Chief Complaint  Patient presents with   Follow-up    FASTING 6 month follow up for DM, Hyperlipidemia Wants flu vaccine  Due for foot exam, Urine Micro,    HPI Type 2 diabetes mellitus (HCC) Reports mid-day hypoglycemic symptoms with added glipizide  5mg  in AM. Normal foot exam today. Repeat hgbA1c: 6.2% Maintain metformin  dose Decrease glipizide  dose to 2.5mg  with heaviest meal-supper. Check CMP, UACr, and lipid panel F/up in 3months  Hyperlipidemia associated with type 2 diabetes mellitus (HCC) No adverse effects with pravastatin  Repeat lipid panel Maintain med dose  Reviewed medical, surgical, and social history today  Medications: Outpatient Medications Prior to Visit  Medication Sig   acetaminophen  (TYLENOL ) 325 MG tablet Take 2 tablets (650 mg total) by mouth every 6 (six) hours as needed for moderate pain (pain score 4-6).   albuterol  (VENTOLIN  HFA) 108 (90 Base) MCG/ACT inhaler Inhale 1-2 puffs into the lungs every 6 (six) hours as needed for wheezing or shortness of breath.   betamethasone dipropionate (DIPROLENE) 0.05 % ointment Apply topically.   budesonide -formoterol  (SYMBICORT ) 160-4.5 MCG/ACT inhaler Inhale 2 puffs into the lungs in the morning and at bedtime.   cetirizine  (ZYRTEC  ALLERGY) 10 MG tablet Take 1 tablet (10 mg total) by mouth daily.   cyclobenzaprine  (FLEXERIL ) 5 MG tablet TAKE 1 TABLET(5 MG) BY MOUTH DAILY AS NEEDED FOR MUSCLE SPASMS. DO NOT TAKE AT THE SAME TIME WITH NORTRIPTYLINE    diphenhydrAMINE (BENADRYL) 25 mg capsule Take 25 mg by mouth every 8 (eight) hours as needed.   fluticasone  (FLONASE ) 50 MCG/ACT nasal spray Place 2 sprays into both nostrils daily.   hyoscyamine  (LEVSIN ) 0.125 MG tablet TAKE 1 TABLET BY MOUTH EVERY 6 HOURS AS NEEDED FOR CRAMPING   levonorgestrel  (MIRENA ) 20  MCG/24HR IUD 1 each by Intrauterine route once.   lisinopril  (ZESTRIL ) 2.5 MG tablet Take 1 tablet (2.5 mg total) by mouth daily.   metFORMIN  (GLUCOPHAGE -XR) 500 MG 24 hr tablet Take 2 tablets (1,000 mg total) by mouth daily with breakfast.   montelukast  (SINGULAIR ) 10 MG tablet Take 1 tablet (10 mg total) by mouth at bedtime.   Multiple Vitamin (MULTIVITAMIN) tablet Take 1 tablet by mouth daily.   nortriptyline  (PAMELOR ) 10 MG capsule Take 1 capsule (10 mg total) by mouth at bedtime as needed for sleep. For R. ankle pain   pantoprazole  (PROTONIX ) 40 MG tablet Take 1tab in Am and PM x 2weeks, then decrease to 1tab in AM before breakfast   pravastatin  (PRAVACHOL ) 20 MG tablet TAKE 1 TABLET BY MOUTH DAILY   [DISCONTINUED] glipiZIDE  (GLUCOTROL  XL) 5 MG 24 hr tablet Take 1 tablet (5 mg total) by mouth daily with breakfast.   [DISCONTINUED] benzonatate  (TESSALON ) 200 MG capsule Take 1 capsule (200 mg total) by mouth 3 (three) times daily as needed. (Patient not taking: Reported on 09/26/2024)   [DISCONTINUED] diclofenac  Sodium (VOLTAREN ) 1 % GEL Apply 2 g topically 4 (four) times daily. (Patient not taking: Reported on 09/26/2024)   No facility-administered medications prior to visit.   Reviewed past medical and social history.   ROS per HPI above      Objective:  BP 110/78 (BP Location: Left Arm, Patient Position: Sitting, Cuff Size: Large)   Pulse 80   Temp 97.6 F (  36.4 C) (Temporal)   Ht 5' 4 (1.626 m)   Wt 148 lb (67.1 kg)   LMP 09/23/2024   SpO2 97%   BMI 25.40 kg/m      Physical Exam Vitals and nursing note reviewed.  Cardiovascular:     Rate and Rhythm: Normal rate and regular rhythm.     Pulses: Normal pulses.     Heart sounds: Normal heart sounds.  Pulmonary:     Effort: Pulmonary effort is normal.     Breath sounds: Normal breath sounds.  Musculoskeletal:     Right lower leg: No edema.     Left lower leg: No edema.  Neurological:     Mental Status: She is alert and  oriented to person, place, and time.  Psychiatric:        Mood and Affect: Mood normal.        Behavior: Behavior normal.        Thought Content: Thought content normal.     Results for orders placed or performed in visit on 09/26/24  POCT glycosylated hemoglobin (Hb A1C)  Result Value Ref Range   Hemoglobin A1C 6.2 (A) 4.0 - 5.6 %   HbA1c POC (<> result, manual entry)     HbA1c, POC (prediabetic range)     HbA1c, POC (controlled diabetic range)        Assessment & Plan:    Problem List Items Addressed This Visit     Hyperlipidemia associated with type 2 diabetes mellitus (HCC)   No adverse effects with pravastatin  Repeat lipid panel Maintain med dose      Relevant Medications   glipiZIDE  (GLUCOTROL  XL) 2.5 MG 24 hr tablet   Other Relevant Orders   Comprehensive metabolic panel with GFR   Lipid panel   Type 2 diabetes mellitus (HCC) - Primary   Reports mid-day hypoglycemic symptoms with added glipizide  5mg  in AM. Normal foot exam today. Repeat hgbA1c: 6.2% Maintain metformin  dose Decrease glipizide  dose to 2.5mg  with heaviest meal-supper. Check CMP, UACr, and lipid panel F/up in 3months      Relevant Medications   glipiZIDE  (GLUCOTROL  XL) 2.5 MG 24 hr tablet   Other Relevant Orders   POCT glycosylated hemoglobin (Hb A1C) (Completed)   Comprehensive metabolic panel with GFR   Microalbumin / creatinine urine ratio   Other Visit Diagnoses       Immunization due       Relevant Orders   Flu vaccine HIGH DOSE PF(Fluzone Trivalent) (Completed)   Td : Tetanus/diphtheria >7yo Preservative  free (Completed)      Return in about 3 months (around 12/27/2024) for CPE (fasting).     Roselie Mood, NP

## 2024-09-26 NOTE — Assessment & Plan Note (Signed)
No adverse effects with pravastatin Repeat lipid panel Maintain med dose

## 2024-09-27 ENCOUNTER — Ambulatory Visit: Payer: Self-pay | Admitting: Nurse Practitioner

## 2024-10-06 ENCOUNTER — Other Ambulatory Visit: Payer: Self-pay | Admitting: Nurse Practitioner

## 2024-10-06 DIAGNOSIS — G8929 Other chronic pain: Secondary | ICD-10-CM

## 2024-10-06 DIAGNOSIS — M4126 Other idiopathic scoliosis, lumbar region: Secondary | ICD-10-CM

## 2024-10-07 ENCOUNTER — Other Ambulatory Visit: Payer: Self-pay | Admitting: Nurse Practitioner

## 2024-10-07 DIAGNOSIS — E1169 Type 2 diabetes mellitus with other specified complication: Secondary | ICD-10-CM

## 2024-10-12 ENCOUNTER — Other Ambulatory Visit: Payer: Self-pay | Admitting: Nurse Practitioner

## 2024-10-12 DIAGNOSIS — E1169 Type 2 diabetes mellitus with other specified complication: Secondary | ICD-10-CM

## 2024-10-15 NOTE — Telephone Encounter (Signed)
 Dose changed to 2.5 mg daily on 09/26/24

## 2024-11-05 ENCOUNTER — Encounter

## 2024-11-05 ENCOUNTER — Ambulatory Visit: Admitting: Pulmonary Disease

## 2024-12-24 ENCOUNTER — Ambulatory Visit: Admitting: Pulmonary Disease

## 2024-12-24 ENCOUNTER — Encounter

## 2024-12-24 ENCOUNTER — Ambulatory Visit

## 2024-12-24 ENCOUNTER — Encounter: Payer: Self-pay | Admitting: Pulmonary Disease

## 2024-12-24 VITALS — BP 107/72 | HR 83 | Temp 98.6°F | Ht 64.0 in | Wt 146.0 lb

## 2024-12-24 DIAGNOSIS — J4540 Moderate persistent asthma, uncomplicated: Secondary | ICD-10-CM

## 2024-12-24 DIAGNOSIS — K219 Gastro-esophageal reflux disease without esophagitis: Secondary | ICD-10-CM | POA: Diagnosis not present

## 2024-12-24 DIAGNOSIS — R059 Cough, unspecified: Secondary | ICD-10-CM

## 2024-12-24 DIAGNOSIS — Z8616 Personal history of COVID-19: Secondary | ICD-10-CM

## 2024-12-24 DIAGNOSIS — J45909 Unspecified asthma, uncomplicated: Secondary | ICD-10-CM

## 2024-12-24 LAB — PULMONARY FUNCTION TEST
DL/VA % pred: 112 %
DL/VA: 4.76 ml/min/mmHg/L
DLCO unc % pred: 91 %
DLCO unc: 18.82 ml/min/mmHg
FEF 25-75 Post: 2.46 L/s
FEF 25-75 Pre: 2.15 L/s
FEF2575-%Change-Post: 14 %
FEF2575-%Pred-Post: 97 %
FEF2575-%Pred-Pre: 85 %
FEV1-%Change-Post: 2 %
FEV1-%Pred-Post: 83 %
FEV1-%Pred-Pre: 81 %
FEV1-Post: 2.23 L
FEV1-Pre: 2.18 L
FEV1FVC-%Change-Post: 0 %
FEV1FVC-%Pred-Pre: 99 %
FEV6-%Change-Post: 2 %
FEV6-%Pred-Post: 84 %
FEV6-%Pred-Pre: 82 %
FEV6-Post: 2.79 L
FEV6-Pre: 2.73 L
FEV6FVC-%Change-Post: 0 %
FEV6FVC-%Pred-Post: 102 %
FEV6FVC-%Pred-Pre: 101 %
FVC-%Change-Post: 1 %
FVC-%Pred-Post: 82 %
FVC-%Pred-Pre: 81 %
FVC-Post: 2.81 L
FVC-Pre: 2.77 L
Post FEV1/FVC ratio: 79 %
Post FEV6/FVC ratio: 99 %
Pre FEV1/FVC ratio: 79 %
Pre FEV6/FVC Ratio: 99 %
RV % pred: 125 %
RV: 2.39 L
TLC % pred: 102 %
TLC: 5.17 L

## 2024-12-24 NOTE — Patient Instructions (Signed)
 Full pft performed today

## 2024-12-24 NOTE — Progress Notes (Signed)
 Full pft performed today

## 2025-01-02 ENCOUNTER — Encounter: Admitting: Nurse Practitioner

## 2025-02-10 ENCOUNTER — Encounter

## 2025-02-11 ENCOUNTER — Ambulatory Visit: Admitting: Pulmonary Disease
